# Patient Record
Sex: Male | Born: 1980
Health system: Southern US, Community
[De-identification: ages and names within clinical notes are randomized; demographics above are authoritative.]

## PROBLEM LIST (undated history)

## (undated) DIAGNOSIS — A6000 Herpesviral infection of urogenital system, unspecified: Secondary | ICD-10-CM

## (undated) DIAGNOSIS — M779 Enthesopathy, unspecified: Secondary | ICD-10-CM

## (undated) DIAGNOSIS — M7520 Bicipital tendinitis, unspecified shoulder: Secondary | ICD-10-CM

## (undated) DIAGNOSIS — M8700 Idiopathic aseptic necrosis of unspecified bone: Secondary | ICD-10-CM

## (undated) DIAGNOSIS — Z8639 Personal history of other endocrine, nutritional and metabolic disease: Secondary | ICD-10-CM

## (undated) DIAGNOSIS — K219 Gastro-esophageal reflux disease without esophagitis: Secondary | ICD-10-CM

## (undated) DIAGNOSIS — L4050 Arthropathic psoriasis, unspecified: Secondary | ICD-10-CM

## (undated) DIAGNOSIS — F909 Attention-deficit hyperactivity disorder, unspecified type: Secondary | ICD-10-CM

## (undated) DIAGNOSIS — L409 Psoriasis, unspecified: Secondary | ICD-10-CM

## (undated) DIAGNOSIS — J329 Chronic sinusitis, unspecified: Secondary | ICD-10-CM

## (undated) DIAGNOSIS — I10 Essential (primary) hypertension: Secondary | ICD-10-CM

## (undated) HISTORY — DX: Psoriasis, unspecified: L40.9

## (undated) HISTORY — DX: Herpesviral infection of urogenital system, unspecified: A60.00

## (undated) HISTORY — PX: KNEE ARTHROSCOPY: SUR90

## (undated) HISTORY — DX: Essential (primary) hypertension: I10

## (undated) HISTORY — DX: Bicipital tendinitis, unspecified shoulder: M75.20

## (undated) HISTORY — DX: Idiopathic aseptic necrosis of unspecified bone: M87.00

## (undated) HISTORY — DX: Attention-deficit hyperactivity disorder, unspecified type: F90.9

## (undated) HISTORY — PX: VASECTOMY: SHX75

## (undated) HISTORY — DX: Arthropathic psoriasis, unspecified: L40.50

## (undated) HISTORY — DX: Personal history of other endocrine, nutritional and metabolic disease: Z86.39

## (undated) HISTORY — DX: Gastro-esophageal reflux disease without esophagitis: K21.9

---

## 2011-02-14 ENCOUNTER — Emergency Department (INDEPENDENT_AMBULATORY_CARE_PROVIDER_SITE_OTHER)
Admission: EM | Admit: 2011-02-14 | Discharge: 2011-02-14 | Disposition: A | Discharge status: Home / Self Care | Payer: BC Managed Care – PPO | Source: Home / Self Care | Attending: Family Medicine | Admitting: Family Medicine

## 2011-02-14 ENCOUNTER — Encounter (HOSPITAL_COMMUNITY): Payer: Self-pay | Admitting: *Deleted

## 2011-02-14 DIAGNOSIS — H6692 Otitis media, unspecified, left ear: Secondary | ICD-10-CM

## 2011-02-14 DIAGNOSIS — J019 Acute sinusitis, unspecified: Secondary | ICD-10-CM

## 2011-02-14 DIAGNOSIS — H6690 Otitis media, unspecified, unspecified ear: Secondary | ICD-10-CM

## 2011-02-14 HISTORY — DX: Enthesopathy, unspecified: M77.9

## 2011-02-14 HISTORY — DX: Chronic sinusitis, unspecified: J32.9

## 2011-02-14 MED ORDER — IPRATROPIUM BROMIDE 0.06 % NA SOLN: 2.0000 | Freq: Four times a day (QID) | NASAL | Status: DC

## 2011-02-14 MED ORDER — CEFDINIR 300 MG PO CAPS: 300.0000 mg | ORAL_CAPSULE | Freq: Two times a day (BID) | ORAL | Status: AC

## 2011-02-14 MED ORDER — PSEUDOEPHEDRINE-GUAIFENESIN ER 120-1200 MG PO TB12: 120.0000 mg | ORAL_TABLET | Freq: Two times a day (BID) | ORAL | Status: DC

## 2011-02-14 NOTE — ED Provider Notes (Cosign Needed)
History     CSN: [id number]  Arrival date & time 02/14/11  1604   First MD Initiated Contact with Patient 02/14/11 1645      Chief Complaint  Patient presents with  . Cough  . Nasal Congestion  . Headache  . Otalgia    (Consider location/radiation/quality/duration/timing/severity/associated sxs/prior treatment) Patient is a 31 y.o. male presenting with cough, headaches, and ear pain. The history is provided by the patient.  Cough This is a new problem. The current episode started yesterday (uri sx last week ,now with left ear discomfort and muffled hearing,). The problem has been gradually worsening. Associated symptoms include ear congestion, ear pain, headaches and rhinorrhea. He is not a smoker.  Headache The primary symptoms include headaches.  Otalgia Associated symptoms include headaches, rhinorrhea and cough.    Past Medical History  Diagnosis Date  . Sinusitis   . Tendonitis     History reviewed. No pertinent past surgical history.  History reviewed. No pertinent family history.  History  Substance Use Topics  . Smoking status: Never Smoker   . Smokeless tobacco: Not on file  . Alcohol Use: Yes      Review of Systems  Constitutional: Negative.   HENT: Positive for ear pain, congestion, rhinorrhea and postnasal drip.   Eyes: Negative.   Respiratory: Positive for cough.   Cardiovascular: Negative.   Gastrointestinal: Negative.   Musculoskeletal: Negative.   Neurological: Positive for headaches.    Allergies  Review of patient's allergies indicates no known allergies.  Home Medications   Current Outpatient Rx  Name Route Sig Dispense Refill  . CEFDINIR 300 MG PO CAPS Oral Take 1 capsule (300 mg total) by mouth 2 (two) times daily. 20 capsule 0  . IPRATROPIUM BROMIDE 0.06 % NA SOLN Nasal Place 2 sprays into the nose 4 (four) times daily. 15 mL 12  . PSEUDOEPHEDRINE-GUAIFENESIN ER [phone] MG PO TB12 Oral Take 120-1,200 mg by mouth 2 (two) times  daily. 30 each 0    BP 141/77  Pulse 85  Temp(Src) 97.7 F (36.5 C) (Oral)  Resp 18  SpO2 94%  Physical Exam  Nursing note and vitals reviewed. Constitutional: He appears well-developed and well-nourished.  HENT:  Head: Normocephalic.  Right Ear: External ear and ear canal normal. Tympanic membrane is retracted.  Left Ear: External ear and ear canal normal. Tympanic membrane is injected, erythematous and retracted. Tympanic membrane mobility is abnormal. A middle ear effusion is present. Decreased hearing is noted.    ED Course  Procedures (including critical care time)  Labs Reviewed - No data to display No results found.   1. Otitis media of left ear   2. Sinusitis acute       MDM          Barkley Bruns, MD 02/14/11 1734

## 2011-02-14 NOTE — ED Notes (Signed)
Pt with onset of cough/congestion x one week - now with headache / left ear pain - decreased hearing left ear - history sinus infections

## 2012-08-22 ENCOUNTER — Ambulatory Visit
Admission: RE | Admit: 2012-08-22 | Discharge: 2012-08-22 | Disposition: A | Discharge status: Home / Self Care | Payer: BC Managed Care – PPO | Source: Ambulatory Visit | Attending: Rheumatology | Admitting: Rheumatology

## 2012-08-22 ENCOUNTER — Other Ambulatory Visit: Payer: Self-pay | Admitting: Rheumatology

## 2012-08-22 DIAGNOSIS — Z008 Encounter for other general examination: Secondary | ICD-10-CM

## 2015-10-17 LAB — HEPATIC FUNCTION PANEL
ALT: 62 U/L — AB (ref 10–40)
AST: 31 U/L (ref 14–40)
Alkaline Phosphatase: 58 U/L (ref 25–125)
Bilirubin, Total: 0.4 mg/dL

## 2015-10-17 LAB — BASIC METABOLIC PANEL
BUN: 8 mg/dL (ref 4–21)
Creatinine: 1.1 mg/dL (ref 0.6–1.3)
Glucose: 89 mg/dL
Potassium: 4.6 mmol/L (ref 3.4–5.3)
Sodium: 137 mmol/L (ref 137–147)

## 2015-10-17 LAB — CBC AND DIFFERENTIAL
HCT: 45 % (ref 41–53)
Hemoglobin: 15.5 g/dL (ref 13.5–17.5)
Platelets: 344 10*3/uL (ref 150–399)
WBC: 6.1 10^3/mL

## 2015-11-13 ENCOUNTER — Ambulatory Visit: Payer: Self-pay | Admitting: Rheumatology

## 2015-12-03 ENCOUNTER — Encounter: Payer: Self-pay | Admitting: *Deleted

## 2015-12-03 DIAGNOSIS — L409 Psoriasis, unspecified: Secondary | ICD-10-CM

## 2015-12-03 DIAGNOSIS — M7520 Bicipital tendinitis, unspecified shoulder: Secondary | ICD-10-CM

## 2015-12-03 DIAGNOSIS — Z9119 Patient's noncompliance with other medical treatment and regimen: Secondary | ICD-10-CM | POA: Insufficient documentation

## 2015-12-03 DIAGNOSIS — F329 Major depressive disorder, single episode, unspecified: Secondary | ICD-10-CM | POA: Insufficient documentation

## 2015-12-03 DIAGNOSIS — F32A Depression, unspecified: Secondary | ICD-10-CM | POA: Insufficient documentation

## 2015-12-03 DIAGNOSIS — K219 Gastro-esophageal reflux disease without esophagitis: Secondary | ICD-10-CM

## 2015-12-03 DIAGNOSIS — I10 Essential (primary) hypertension: Secondary | ICD-10-CM

## 2015-12-03 DIAGNOSIS — L4050 Arthropathic psoriasis, unspecified: Secondary | ICD-10-CM

## 2015-12-03 DIAGNOSIS — Z91199 Patient's noncompliance with other medical treatment and regimen due to unspecified reason: Secondary | ICD-10-CM | POA: Insufficient documentation

## 2015-12-03 DIAGNOSIS — A6000 Herpesviral infection of urogenital system, unspecified: Secondary | ICD-10-CM | POA: Insufficient documentation

## 2015-12-03 DIAGNOSIS — F40298 Other specified phobia: Secondary | ICD-10-CM | POA: Insufficient documentation

## 2015-12-03 DIAGNOSIS — F909 Attention-deficit hyperactivity disorder, unspecified type: Secondary | ICD-10-CM

## 2015-12-03 HISTORY — DX: Bicipital tendinitis, unspecified shoulder: M75.20

## 2015-12-03 HISTORY — DX: Essential (primary) hypertension: I10

## 2015-12-03 HISTORY — DX: Herpesviral infection of urogenital system, unspecified: A60.00

## 2015-12-03 HISTORY — DX: Attention-deficit hyperactivity disorder, unspecified type: F90.9

## 2015-12-03 HISTORY — DX: Gastro-esophageal reflux disease without esophagitis: K21.9

## 2015-12-03 HISTORY — DX: Psoriasis, unspecified: L40.9

## 2015-12-03 HISTORY — DX: Arthropathic psoriasis, unspecified: L40.50

## 2015-12-03 NOTE — Progress Notes (Signed)
*  IMAGE* Office Visit Note  Patient: Jacob Massey             Date of Birth: 1981-01-30           MRN: [id number]             PCP: Londell MohPHARR,WALTER DAVIDSON, MD Referring: No ref. provider found Visit Date: 12/04/2015 Occupation:@GUAROCC @    Subjective:  Pain bilateral hands   History of Present Illness: Jacob HayShawn Newhouse is a 35 y.o. male with psoriatic arthritis. He is been having a lot of pain and discomfort in his hands and feet. His psoriasis has been flaring. He has been off Humira for a month now and wants to start on Enbrel as soon as possible. 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      No Rheumatology ROS completed.                                              Objective: Vital Signs: There were no vitals taken for this visit.   Physical Exam not performed  Musculoskeletal Exam:  CDAI Exam: No CDAI exam completed.    Investigation: Findings:  10/15/2015 CBC normal, CMP normal, TB: Negative    Imaging: Koreas Extrem Up Bilat Comp  Result Date: 12/04/2015 Ultrasound examination of bilateral hands was performed using your recommendation after informed consent was obtained. Using 12 MHz transducer grayscale and color Doppler bilateral second and third MCP joints bilateral second and third and fifth digit PIP and DIP joints and bilateral wrist joints both dorsal and volar aspects were evaluated. The findings were he had narrowing of all PIP/DIP joints. There was mild synovitis in bilateral second MCP joint and bilateral wrist joints. Right median nerve was 0.13 cm squares which was upper limits of normal and left was 0.12 cm squares which was upper limits of normal. Impression these findings are consistent with mild inflammatory arthritis he has underlying psoriatic arthritis. The median nerves are within normal limits.   Speciality Comments: No specialty comments available.    Procedures:   No procedures performed Allergies: Methotrexate derivatives   Assessment / Plan: Visit Diagnoses: Psoriatic arthritis (HCC) active disease with ongoing pain and discomfort.  Psoriasis: Patient is having a flare.  High risk medication use he's been off Humira. We had detailed discussion regarding dictation side effects contraindications of Enbrel. After informed consent was obtained he was given his first Enbrel injection subcutaneously in the office today. He received  detailed counseling by Dr. Orson AloeHenderson of her pharmacist. After injection and he was observed in the office for 30 minutes and no side effects are observed.  Depression, unspecified depression type  Noncompliance  Needle phobia    Orders: Orders Placed This Encounter  Procedures  . US Extrem Up Bilat Comp   No orders of the defined types were placed in this encounter.   Face-to-face time spent with patient was 30 minutes. 50% of time was spent in counseling and coordination of care.  Follow-Up Instructions: No Follow-up on file.

## 2015-12-04 ENCOUNTER — Encounter: Payer: Self-pay | Admitting: Rheumatology

## 2015-12-04 ENCOUNTER — Ambulatory Visit (INDEPENDENT_AMBULATORY_CARE_PROVIDER_SITE_OTHER): Payer: BLUE CROSS/BLUE SHIELD | Admitting: Rheumatology

## 2015-12-04 ENCOUNTER — Inpatient Hospital Stay (INDEPENDENT_AMBULATORY_CARE_PROVIDER_SITE_OTHER): Payer: BLUE CROSS/BLUE SHIELD

## 2015-12-04 VITALS — BP 132/96 | HR 66

## 2015-12-04 DIAGNOSIS — Z79899 Other long term (current) drug therapy: Secondary | ICD-10-CM | POA: Diagnosis not present

## 2015-12-04 DIAGNOSIS — L409 Psoriasis, unspecified: Secondary | ICD-10-CM | POA: Diagnosis not present

## 2015-12-04 DIAGNOSIS — M79642 Pain in left hand: Secondary | ICD-10-CM | POA: Diagnosis not present

## 2015-12-04 DIAGNOSIS — F329 Major depressive disorder, single episode, unspecified: Secondary | ICD-10-CM | POA: Diagnosis not present

## 2015-12-04 DIAGNOSIS — L4050 Arthropathic psoriasis, unspecified: Secondary | ICD-10-CM

## 2015-12-04 DIAGNOSIS — F40298 Other specified phobia: Secondary | ICD-10-CM

## 2015-12-04 DIAGNOSIS — Z9119 Patient's noncompliance with other medical treatment and regimen: Secondary | ICD-10-CM

## 2015-12-04 DIAGNOSIS — M79641 Pain in right hand: Secondary | ICD-10-CM | POA: Diagnosis not present

## 2015-12-04 DIAGNOSIS — F32A Depression, unspecified: Secondary | ICD-10-CM

## 2015-12-04 DIAGNOSIS — Z91199 Patient's noncompliance with other medical treatment and regimen due to unspecified reason: Secondary | ICD-10-CM

## 2015-12-04 MED ORDER — ETANERCEPT 50 MG/ML ~~LOC~~ SOAJ: 50.0000 mg | SUBCUTANEOUS | 0 refills | Status: DC

## 2015-12-04 NOTE — Patient Instructions (Signed)
Standing Labs We placed an order today for your standing lab work.    Please come back and get your standing labs in one month then every 2 months.   We have open lab Monday through Friday from 8:30-11:30 AM and 1-4 PM at the office of Dr. Arbutus PedShaili Deveshwar/Naitik Panwala, PA.   The office is located at [address], [address], [place], [place] [address] No appointment is necessary.   Labs are drawn by [facility].  You may receive a bill from [facility] for your lab work.     Etanercept injection What is this medicine? ETANERCEPT (et a MotorolaER sept) is used for the treatment of rheumatoid arthritis in adults and children. The medicine is also used to treat psoriatic arthritis, ankylosing spondylitis, and psoriasis. This medicine may be used for other purposes; ask your health care provider or pharmacist if you have questions. What should I tell my health care provider before I take this medicine? They need to know if you have any of these conditions: -blood disorders -cancer -congestive heart failure -diabetes -exposure to chickenpox -immune system problems -infection -multiple sclerosis -seizure disorder -tuberculosis, a positive skin test for tuberculosis or have recently been in close contact with someone who has tuberculosis -Wegener's granulomatosis -an unusual or allergic reaction to etanercept, latex, other medicines, foods, dyes, or preservatives -pregnant or trying to get pregnant -breast-feeding How should I use this medicine? The medicine is given by injection under the skin. You will be taught how to prepare and give this medicine. Use exactly as directed. Take your medicine at regular intervals. Do not take your medicine more often than directed. It is important that you put your used needles and syringes in a special sharps container. Do not put them in a trash can. If you do not have a sharps container, call your pharmacist or healthcare provider to get one. A special MedGuide  will be given to you by the pharmacist with each prescription and refill. Be sure to read this information carefully each time. Talk to your pediatrician regarding the use of this medicine in children. While this drug may be prescribed for children as young as 37 years of age for selected conditions, precautions do apply. Overdosage: If you think you have taken too much of this medicine contact a poison control center or emergency room at once. NOTE: This medicine is only for you. Do not share this medicine with others. What if I miss a dose? If you miss a dose, contact your health care professional to find out when you should take your next dose. Do not take double or extra doses without advice. What may interact with this medicine? Do not take this medicine with any of the following medications: -anakinra This medicine may also interact with the following medications: -cyclophosphamide -sulfasalazine -vaccines This list may not describe all possible interactions. Give your health care provider a list of all the medicines, herbs, non-prescription drugs, or dietary supplements you use. Also tell them if you smoke, drink alcohol, or use illegal drugs. Some items may interact with your medicine. What should I watch for while using this medicine? Tell your doctor or healthcare professional if your symptoms do not start to get better or if they get worse. You will be tested for tuberculosis (TB) before you start this medicine. If your doctor prescribes any medicine for TB, you should start taking the TB medicine before starting this medicine. Make sure to finish the full course of TB medicine. Call your doctor  or health care professional for advice if you get a fever, chills or sore throat, or other symptoms of a cold or flu. Do not treat yourself. This drug decreases your body's ability to fight infections. Try to avoid being around people who are sick. What side effects may I notice from receiving  this medicine? Side effects that you should report to your doctor or health care professional as soon as possible: -allergic reactions like skin rash, itching or hives, swelling of the face, lips, or tongue -changes in vision -fever, chills or any other sign of infection -numbness or tingling in legs or other parts of the body -red, scaly patches or raised bumps on the skin -shortness of breath or difficulty breathing -swollen lymph nodes in the neck, underarm, or groin areas -unexplained weight loss -unusual bleeding or bruising -unusual swelling or fluid retention in the legs -unusually weak or tired Side effects that usually do not require medical attention (report to your doctor or health care professional if they continue or are bothersome): -dizziness -headache -nausea -redness, itching, or swelling at the injection site -vomiting This list may not describe all possible side effects. Call your doctor for medical advice about side effects. You may report side effects to FDA at 1-800-FDA-1088. Where should I keep my medicine? Keep out of the reach of children. Store between 2 and 8 degrees C (36 and 46 degrees F). Do not freeze or shake. Protect from light. Throw away any unused medicine after the expiration date. You will be instructed on how to store this medicine. NOTE: This sheet is a summary. It may not cover all possible information. If you have questions about this medicine, talk to your doctor, pharmacist, or health care provider.    2016, Elsevier/Gold Standard. (2011-07-27 15:33:36)

## 2015-12-04 NOTE — Progress Notes (Signed)
Pharmacy Note  Subjective: Patient presents today to the [facility] to see Dr. Corliss Skainseveshwar.  I previously Counseled patient on Mauritaniatezla; however, insurance states patient must first try and fail Enbrel before Harith Mcc[place] will be approved.  Per Dr. Corliss Skainseveshwar, okay to start patient on Enbrel at this time.  Patient seen by the pharmacist for counseling on Enbrel.    Objective: TB Test: negative (10/15/15) Hepatitis panel: negative (08/22/12)  CBC    Component Value Date/Time   WBC 6.1 10/17/2015   HGB 15.5 10/17/2015   HCT 45 10/17/2015   PLT 344 10/17/2015    Assessment/Plan:  Counseled patient that Enbrel is a TNF blocking agent.  Reviewed Enbrel dose of 50 mg once weekly.  Counseled patient on purpose, proper use, and adverse effects of Enbrel.  Reviewed the most common adverse effects including infections, headache, and injection site reactions. Discussed that there is the possibility of an increased risk of malignancy but it is not well understood if this increased risk is due to the medication or the disease state.  Advised patient to get yearly dermatology exams due to risk of skin cancer.  Reviewed the importance of regular labs while on Enbrel therapy.  Advised patient to get standing labs one month after starting Enbrel then every 3 months.  Provided patient with standing lab orders.  Counseled patient that Enbrel should be held prior to scheduled surgery.  Counseled patient to avoid live vaccines while on Enbrel.  Advised patient to get annual influenza vaccine and the pneumococcal vaccine as needed.  Provided patient with medication education material and answered all questions.  Patient voiced understanding.  Patient consented to Enbrel.  Will upload consent into the media tab.  Educated patient how to use Enbrel SureClick pen.  Patient administered first Enbrel injection in clinic today (Lot #[id number], Exp 12/19).  Patient was monitored for 30 minutes after injection and tolerated  medication well.  No injection site redness was noted.  Lilla Shookachel Bryston Colocho, Pharm.D., BCPS Clinical Pharmacist Pager: [phone] Phone: [phone] 12/04/2015 2:29 PM

## 2015-12-13 ENCOUNTER — Other Ambulatory Visit: Payer: Self-pay | Admitting: Radiology

## 2015-12-13 ENCOUNTER — Telehealth: Payer: Self-pay | Admitting: Rheumatology

## 2015-12-13 MED ORDER — ETANERCEPT 50 MG/ML ~~LOC~~ SOAJ: 50.0000 mg | SUBCUTANEOUS | 2 refills | Status: DC

## 2015-12-13 MED FILL — ENBREL 50 MG/ML SURECLICK S: 50 | 28 days supply | Qty: 4 | Fill #0

## 2015-12-13 NOTE — Telephone Encounter (Signed)
Fleet ContrasRachel and [place] have left message. I have discussed with patient. He can get his med[facility] at  [facility] for $5 copay. I have resent the Rx for him, he wants mailed to him, I have verified his address and sent the Rx / told him the pharmacy will call him   He states he has a sinus infection and has held the enbrel until this resolves. He will let  Me know if he has any problems or needs anything

## 2015-12-13 NOTE — Telephone Encounter (Signed)
Patient states he received a voicemail from here yesterday about Enbrel rx. Please call patient back.

## 2016-01-06 ENCOUNTER — Telehealth: Payer: Self-pay

## 2016-01-14 ENCOUNTER — Ambulatory Visit: Payer: Self-pay | Admitting: Rheumatology

## 2016-02-06 NOTE — Telephone Encounter (Signed)
Called to verifify that patient needs a refill for Enbrel. L/m for patient to call back.

## 2016-02-21 ENCOUNTER — Telehealth: Payer: Self-pay | Admitting: Rheumatology

## 2016-02-21 NOTE — Telephone Encounter (Signed)
Patient is requesting refill of enbrel. He isn't sure where he gets this filled at.

## 2016-02-24 NOTE — Telephone Encounter (Signed)
He was here today, [name] took care of him.

## 2016-02-28 MED FILL — ENBREL 50 MG/ML SURECLICK S: 50 | 28 days supply | Qty: 4 | Fill #1

## 2016-03-12 DIAGNOSIS — Z8719 Personal history of other diseases of the digestive system: Secondary | ICD-10-CM | POA: Insufficient documentation

## 2016-03-12 DIAGNOSIS — M47816 Spondylosis without myelopathy or radiculopathy, lumbar region: Secondary | ICD-10-CM | POA: Insufficient documentation

## 2016-03-12 DIAGNOSIS — Z8679 Personal history of other diseases of the circulatory system: Secondary | ICD-10-CM | POA: Insufficient documentation

## 2016-03-12 NOTE — Progress Notes (Signed)
Office Visit Note  Patient: Jacob Massey             Date of Birth: [birth year]           MRN: [id number]             PCP: Horatio Pel, MD Referring: Deland Pretty, MD Visit Date: 03/16/2016 Occupation: _0 @    Subjective:  Pain of the Right Knee and Follow-up Follow-up on psoriatic arthritis and psoriasis  History of Present Illness: Jacob Massey is a 36 y.o. male  Last seen 10/15/2015 Patient is now on Enbrel. He takes it every week.  Had a sinus infection and had to discontinue Enbrel for short period of time. Currently he feels that the Enbrel is not as painful as the Humira injection and is happy to continue with Enbrel. He does complain that while he was off of the Enbrel, he had warmth at night to his MCP joints bilaterally which causes him to wake up. Otherwise he is tolerating the medication well.  He has fair amount of psoriasis to bilateral elbows and bilateral knees.    Activities of Daily Living:  Patient reports morning stiffness for 15 minutes.   Patient Denies nocturnal pain.  Difficulty dressing/grooming: Denies Difficulty climbing stairs: Denies Difficulty getting out of chair: Denies Difficulty using hands for taps, buttons, cutlery, and/or writing: Denies   Review of Systems  Constitutional: Negative for fatigue.  HENT: Negative for mouth sores and mouth dryness.   Eyes: Negative for dryness.  Respiratory: Negative for shortness of breath.   Gastrointestinal: Negative for constipation and diarrhea.  Musculoskeletal: Negative for myalgias and myalgias.  Skin: Negative for sensitivity to sunlight.  Neurological: Negative for memory loss.  Psychiatric/Behavioral: Negative for sleep disturbance.    PMFS History:  Patient Active Problem List   Diagnosis Date Noted  . History of gastroesophageal reflux (GERD) 03/12/2016  . History of hypertension 03/12/2016  . Spondylosis of lumbar region without myelopathy or radiculopathy  03/12/2016  . Psoriasis 12/03/2015  . Psoriatic arthritis ([place]) 12/03/2015  . Bicipital tendinitis 12/03/2015  . Hypertension 12/03/2015  . GERD (gastroesophageal reflux disease) 12/03/2015  . ADHD 12/03/2015  . Genital herpes 12/03/2015  . Depression 12/03/2015  . Noncompliance 12/03/2015  . Needle phobia 12/03/2015    Past Medical History:  Diagnosis Date  . ADHD 12/03/2015  . Bicipital tendinitis 12/03/2015  . Genital herpes 12/03/2015  . GERD (gastroesophageal reflux disease) 12/03/2015  . Hypertension 12/03/2015  . Psoriasis 12/03/2015  . Psoriatic arthritis ([place]) 12/03/2015  . Sinusitis   . Tendonitis     No family history on file. No past surgical history on file. Social History   Social History Narrative  . No narrative on file     Objective: Vital Signs: BP 138/70   Pulse 82   Resp 16   Ht _1  (1.854 m)   Wt 244 lb (110.7 kg)   BMI 32.19 kg/m    Physical Exam  Constitutional: He is oriented to person, place, and time. He appears well-developed and well-nourished.  HENT:  Head: Normocephalic and atraumatic.  Eyes: Conjunctivae and EOM are normal. Pupils are equal, round, and reactive to light.  Neck: Normal range of motion. Neck supple.  Cardiovascular: Normal rate, regular rhythm and normal heart sounds.  Exam reveals no gallop and no friction rub.   No murmur heard. Pulmonary/Chest: Effort normal and breath sounds normal. No respiratory distress. He has no wheezes. He has no rales. He exhibits no tenderness.  Abdominal: Soft. He exhibits no distension and no mass. There is no tenderness. There is no guarding.  Musculoskeletal: Normal range of motion.  Lymphadenopathy:    He has no cervical adenopathy.  Neurological: He is alert and oriented to person, place, and time. He exhibits normal muscle tone. Coordination normal.  Skin: Skin is warm and dry. Capillary refill takes less than 2 seconds. No rash noted.  Psychiatric: He has a normal mood and  affect. His behavior is normal. Judgment and thought content normal.  Nursing note and vitals reviewed.    Musculoskeletal Exam:  Full range of motion of all joints Grip strength is equal and strong bilaterally Fibromyalgia tender points are all absent  CDAI Exam: No CDAI exam completed.  No synovitis on examination  Investigation: Findings:   Labs from 10/15/15 shows CMP with GFR normal, CBC with diff is normal. TB gold is negative   08/15/12 We obtained x-ray of bilateral hands today in the office, which showed PIP narrowing, minimal without erosive changes.  Ferguson view, pelvis, was also within normal limits without any sclerosis or joint space narrowing.     Labs from July 2014 show aldolase at 11.2.  CK is elevated at 329.  During this time the patient was doing a significant amount of heavy lifting and he dead lifts about 255 pounds.  As a result of this history it is possible that this caused elevated CK levels.  We will monitor this, but at this time we do not feel that there is any kind of myositis going on.  Urinalysis is negative.  HLA-B27 is negative.  TB Gold is negative.  CBC is normal and CMP is normal.  Sed rate is 5.  Uric acid is 5.7.  TSH is normal.  Rheumatoid factor is negative.  CCP is negative, ANA negative and hepatitis panel is negative.    12/25/2013 Bilateral knee joint x-rays done today.  Mild medial compartment narrowing bilaterally and otherwise within normal limits.      Imaging: No results found.      Bilateral elbows and knees have a fair amount of psoriasis. Patient is declining clobetasol cream at this time. He states "my work keeps me very busy and is not convenient for me to use topicals"  Speciality Comments: No specialty comments available.    Procedures:  No procedures performed Allergies: Methotrexate derivatives   Assessment / Plan:     Visit Diagnoses: Psoriatic arthritis ([place])  Psoriasis  High risk medication use -  03/16/2016: Enbrel every week[Failed Humira; can't take Remicade because he is unable to take methotrexate; needle phobia; history noncompliance] - Plan: COMPLETE METABOLIC PANEL WITH GFR, CBC with Differential/Platelet, CBC with Differential/Platelet, COMPLETE METABOLIC PANEL WITH GFR, CBC with Differential/Platelet, COMPLETE METABOLIC PANEL WITH GFR, Quantiferon tb gold assay (blood)  History of gastroesophageal reflux (GERD)  History of hypertension  Needle phobia  Spondylosis of lumbar region without myelopathy or radiculopathy  Noncompliance   Plan: #1: Psoriatic arthritis and psoriasis Currently on Enbrel every week. Had to suspend Enbrel for couple of weeks due to sinus infection. That is now treated and he is doing well. He will restart his Enbrel and we will see him back in 3 months to see how well he is doing with the medication.  Note that he had inadequate response to Humira.  Note that he also has a history of noncompliance, needle phobia. As a result we want to do O tesla but it was not approved. We are unable  to use Remicade since patient cannot take methotrexate  Patient has complained of warmth to bilateral MCP joints at night at times. When patient has enough Enbrel on board, we will recheck and see how well he is tolerating that discomfort of warmth to his knuckles at night. He said since periodic, and there is no pain associated with it. There was no synovitis on examination of his wrist or his MCPs today. On 10/15/2015, he had a fair amount of synovitis on bilateral wrist joints and synovial thickening of bilateral MCP joints. None of that is occurring at this examination  Patient's TB goal is negative as of September 2017. He'll be due for repeat TB gold September 2018  Orders: Orders Placed This Encounter  Procedures  . COMPLETE METABOLIC PANEL WITH GFR  . CBC with Differential/Platelet  . CBC with Differential/Platelet  . COMPLETE METABOLIC PANEL WITH  GFR  . Quantiferon tb gold assay (blood)   No orders of the defined types were placed in this encounter.   Face-to-face time spent with patient was 30 minutes. 50% of time was spent in counseling and coordination of care.  Follow-Up Instructions: Return in about 3 months (around 06/13/2016).   Eliezer Lofts, PA-C  Note - This record has been created using [employer].  Chart creation errors have been sought, but may not always  have been located. Such creation errors do not reflect on  the standard of medical care.

## 2016-03-16 ENCOUNTER — Encounter: Payer: Self-pay | Admitting: Rheumatology

## 2016-03-16 ENCOUNTER — Ambulatory Visit (INDEPENDENT_AMBULATORY_CARE_PROVIDER_SITE_OTHER): Payer: BLUE CROSS/BLUE SHIELD | Admitting: Rheumatology

## 2016-03-16 ENCOUNTER — Ambulatory Visit: Payer: Self-pay | Admitting: Rheumatology

## 2016-03-16 VITALS — BP 138/70 | HR 82 | Resp 16 | Ht 73.0 in | Wt 244.0 lb

## 2016-03-16 DIAGNOSIS — Z91199 Patient's noncompliance with other medical treatment and regimen due to unspecified reason: Secondary | ICD-10-CM

## 2016-03-16 DIAGNOSIS — Z79899 Other long term (current) drug therapy: Secondary | ICD-10-CM

## 2016-03-16 DIAGNOSIS — F40298 Other specified phobia: Secondary | ICD-10-CM

## 2016-03-16 DIAGNOSIS — L4050 Arthropathic psoriasis, unspecified: Secondary | ICD-10-CM

## 2016-03-16 DIAGNOSIS — Z8719 Personal history of other diseases of the digestive system: Secondary | ICD-10-CM

## 2016-03-16 DIAGNOSIS — L409 Psoriasis, unspecified: Secondary | ICD-10-CM

## 2016-03-16 DIAGNOSIS — Z8679 Personal history of other diseases of the circulatory system: Secondary | ICD-10-CM

## 2016-03-16 DIAGNOSIS — M47816 Spondylosis without myelopathy or radiculopathy, lumbar region: Secondary | ICD-10-CM

## 2016-03-16 DIAGNOSIS — Z9119 Patient's noncompliance with other medical treatment and regimen: Secondary | ICD-10-CM

## 2016-03-16 LAB — COMPLETE METABOLIC PANEL WITH GFR
ALT: 67 U/L — ABNORMAL HIGH (ref 9–46)
AST: 32 U/L (ref 10–40)
Albumin: 4.3 g/dL (ref 3.6–5.1)
Alkaline Phosphatase: 57 U/L (ref 40–115)
BUN: 10 mg/dL (ref 7–25)
CO2: 24 mmol/L (ref 20–31)
Calcium: 9.3 mg/dL (ref 8.6–10.3)
Chloride: 105 mmol/L (ref 98–110)
Creat: 1.21 mg/dL (ref 0.60–1.35)
GFR, Est African American: 88 mL/min (ref >=60)
GFR, Est Non African American: 76 mL/min (ref >=60)
Glucose, Bld: 106 mg/dL — ABNORMAL HIGH (ref 65–99)
Potassium: 4.6 mmol/L (ref 3.5–5.3)
Sodium: 137 mmol/L (ref 135–146)
Total Bilirubin: 0.3 mg/dL (ref 0.2–1.2)
Total Protein: 7.2 g/dL (ref 6.1–8.1)

## 2016-03-16 NOTE — Progress Notes (Signed)
Rheumatology Medication Review by a Pharmacist Does the patient feel that his/her medications are working for him/her?  Yes he reports some improvement; however, patient has only been on the medication for a few weeks Has the patient been experiencing any side effects to the medications prescribed?  No Does the patient have any problems obtaining medications?  Yes - Patient could not remember the name of the pharmacy where he got his prescription from initially.  I gave the patient a card with the name and phone number of the pharmacy.    Issues to address at subsequent visits: Adherence   Pharmacist comments:  [name] presents for follow up of psoriatic arthritis and psoriasis.  He is currently taking Enbrel 50 mg weekly.  He started Enbrel on 12/04/2015.  He reports he stopped the medication for a period of time after starting it as he had a sinus infection.  He reports he just restarted the medication approximately two weeks ago.  Most recent standing labs were on 10/15/15 at which time CBC was normal and CMP was normal except for ALT 62.  Patient was due for standing labs one month after starting Enbrel.  Most recent TB Gold was on 10/15/15 which was negative.  Patient will be due for TB Gold again in September 2018.  Patient denies any questions regarding his medication at this time.    Jacob Massey, Pharm.D., BCPS, CPP Clinical Pharmacist Pager: [phone] Phone: [phone] 03/16/2016 9:12 AM

## 2016-03-17 LAB — CBC WITH DIFFERENTIAL/PLATELET
Basophils Absolute: 0 cells/uL (ref 0–200)
Basophils Relative: 0 %
Eosinophils Absolute: 378 cells/uL (ref 15–500)
Eosinophils Relative: 7 %
HCT: 45.8 % (ref 38.5–50.0)
Hemoglobin: 15.2 g/dL (ref 13.2–17.1)
Lymphocytes Relative: 42 %
Lymphs Abs: 2268 cells/uL (ref 850–3900)
MCH: 30 pg (ref 27.0–33.0)
MCHC: 33.2 g/dL (ref 32.0–36.0)
MCV: 90.3 fL (ref 80.0–100.0)
MPV: 9 fL (ref 7.5–12.5)
Monocytes Absolute: 540 cells/uL (ref 200–950)
Monocytes Relative: 10 %
Neutro Abs: 2214 cells/uL (ref 1500–7800)
Neutrophils Relative %: 41 %
Platelets: 304 10*3/uL (ref 140–400)
RBC: 5.07 MIL/uL (ref 4.20–5.80)
RDW: 14.5 % (ref 11.0–15.0)
WBC: 5.4 10*3/uL (ref 3.8–10.8)

## 2016-03-23 ENCOUNTER — Telehealth: Payer: Self-pay

## 2016-03-23 MED FILL — ENBREL 50 MG/ML SURECLICK S: 50 | 28 days supply | Qty: 4 | Fill #2

## 2016-03-23 NOTE — Telephone Encounter (Signed)
Noted patient's last refill of Enbrel was on 02/28/16  at [facility].I called patient with a refill reminder call.  Patient confirms he does need a refill at this time.  Will have the outpatient pharmacy process patient's prescription at this time.    He also states that he was concerned that he did not receive his full dose of his last injection on 03/19/16. He says that after his injection the pen was leaking fluid. "the white part of the pen did not go all the way down." I told him I would have the pharmacists contact him to review how to use the pen and verify that the pen was not defective. Fleet ContrasRachel will you call him? Thanks

## 2016-03-24 NOTE — Telephone Encounter (Signed)
I called patient regarding his concern.  I reviewed injection instructions with patient including the need to remove the white cap, place pen firm against the skin at a 90 degree angle, press the purple start button, and continue to hold firm against the skin while the medication is injecting.  Listen for the second click and continue to hold firm for a few seconds after the second click.    Patient confirms he followed the directions I reviewed.  He reports he knows the medication was injected because he felt the needle, but he noticed some fluid on his leg after the injection was completed.  I advised him to ensure he continues to hold the pen firm against the skin throughout the injection to ensure the needle does not move.  I advised patient to call me if he continues to have problems with the Enbrel SureClick pen.  Reviewed that there are other formulations of Enbrel (Syringe and Enbrel AutoTouch) that can be considered if he continues to have difficulty with the Enbrel SureClick pen.  Patient voiced understanding and denied any further questions at this time.    Lilla Shookachel Leonidas Boateng, Pharm.D., BCPS, CPP Clinical Pharmacist Pager: [phone] Phone: [phone] 03/24/2016 11:52 AM

## 2016-05-13 ENCOUNTER — Telehealth: Payer: Self-pay

## 2016-05-13 MED ORDER — ETANERCEPT 50 MG/ML ~~LOC~~ SOAJ: 50.0000 mg | SUBCUTANEOUS | 2 refills | Status: DC

## 2016-05-13 MED FILL — ENBREL 50 MG/ML SURECLICK S: 50 | 28 days supply | Qty: 4 | Fill #0

## 2016-05-13 NOTE — Telephone Encounter (Signed)
Noted patient's last refill of Enbrel was on 03/23/2016 at [facility]. I called patient with a refill reminder call.  Patient confirms he does need a refill at this time. Patient states he will inject his last pen on Monday, April 16th. He is out of refills at [facility].  He had no concerns about taking his medication. He is not sure how effective it is because his hands are sore. I explained that he would need to discuss that at his next appointment where it can determined if it is or not. I offered to let him speak to the pharmd and he declined.   Sue Lush, can we send a new Rx to [facility] for him? Thanks!  Kerline Trahan, [place], CPhT 9:31 AM

## 2016-05-13 NOTE — Telephone Encounter (Signed)
Last Visit: 03/16/16 Next Visit: 06/22/16 Labs: 03/16/16 WNL TB Gold: 10/2015 Neg  Okay to refill Enbrel?

## 2016-05-13 NOTE — Telephone Encounter (Signed)
I think I signed order but if I did not, ok to refill enbrel (90 day supply)

## 2016-06-08 ENCOUNTER — Telehealth: Payer: Self-pay

## 2016-06-08 NOTE — Telephone Encounter (Signed)
Noted patient's last refill of Enbrel was on 05/13/16  at [facility]  I called patient with a refill reminder call. Left message for patient to call back.  Keyanna Sandefer, [place], CPhT 9:29 AM

## 2016-06-09 ENCOUNTER — Telehealth: Payer: Self-pay

## 2016-06-09 MED ORDER — ETANERCEPT 50 MG/ML ~~LOC~~ SOAJ: 50.0000 mg | SUBCUTANEOUS | 2 refills | Status: DC

## 2016-06-09 MED FILL — ENBREL 50 MG/ML SURECLICK S: 50 | 28 days supply | Qty: 4 | Fill #1

## 2016-06-09 NOTE — Telephone Encounter (Signed)
Last Visit: 03/16/16 Next Visit: 07/06/16 Labs: 03/16/16 WNL TB Gold: 10/2015 Negative  Okay to refill Enbrel?

## 2016-06-09 NOTE — Telephone Encounter (Signed)
Noted patient's last refill of Enbrel was on 05/13/16 at [facility].  I called patient with a refill reminder call.  Patient confirms he does need a refill at this time.  Will have the outpatient pharmacy process patient's prescription at this time.    Patient also states that he has noticed a increase in joint pain and psoriasis spots on his hands. His next appointment is on June 4th. He plans to discuss his options with Dr. Corliss Skainseveshwar.   Alesia Oshields, [place], CPhT 4:23 PM

## 2016-06-09 NOTE — Telephone Encounter (Signed)
I signed the Enbrel that you had ready please verify that it went through

## 2016-06-22 ENCOUNTER — Ambulatory Visit: Payer: BLUE CROSS/BLUE SHIELD | Admitting: Rheumatology

## 2016-06-22 DIAGNOSIS — Z79899 Other long term (current) drug therapy: Secondary | ICD-10-CM | POA: Insufficient documentation

## 2016-06-22 NOTE — Progress Notes (Signed)
Office Visit Note  Patient: Jacob Massey             Date of Birth: [birth year]           MRN: [id number]             PCP: Deland Pretty, MD Referring: Deland Pretty, MD Visit Date: 07/06/2016 Occupation: '@[email or web address]' @    Subjective:  Medication Management   History of Present Illness: Jacob Massey is a 36 y.o. male  Last seen February 2018. At that time, patient had recently started Enbrel but had to suspend use of Enbrel for just a couple of weeks because he was trying to treat a sinus infection. Since then, he has restarted the Enbrel has been taking it every week as prescribed. Unfortunately, patient does not feel that it is working well for him. He has ongoing psoriasis to the bilateral knees and bilateral elbows as well as some other places. He continues to have joint pain swelling and stiffness. He does have warmth to his knuckles at night, some of his joints are more painful than others. He also feels that there is some MCP angulation starting. He is worried that the Enbrel was not effective for him. We have given the medication adequate time and he feels that we may benefit from switching him to another medication.  Patient's TB gold is up-to-date and negative as of February 2018.  He does need to update his CBC with differential and CMP with GFR.   Patient rates his discomfort to his joints as 7 on a scale of 0-10 most of the time during the month. He states that about 3 weeks out of the 4 weeks, he could be at a 7. The lowest that the patient's pain is described as a 3 on a scale of 0-10.  Activities of Daily Living:  Patient reports morning stiffness for 10-15 minutes.   Patient Denies nocturnal pain.  Difficulty dressing/grooming: Reports Difficulty climbing stairs: Denies Difficulty getting out of chair: Reports Difficulty using hands for taps, buttons, cutlery, and/or writing: Reports   Review of Systems  Constitutional: Negative for fatigue.  HENT:  Negative for mouth sores and mouth dryness.   Eyes: Negative for dryness.  Respiratory: Negative for shortness of breath.   Gastrointestinal: Negative for constipation and diarrhea.  Musculoskeletal: Negative for myalgias and myalgias.  Skin: Negative for sensitivity to sunlight.  Neurological: Negative for memory loss.  Psychiatric/Behavioral: Negative for sleep disturbance.    PMFS History:  Patient Active Problem List   Diagnosis Date Noted  . High risk medication use 06/22/2016  . History of gastroesophageal reflux (GERD) 03/12/2016  . History of hypertension 03/12/2016  . 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 Psoriasis 12/03/2015  . Psoriatic arthritis ([place]) 12/03/2015  . Bicipital tendinitis 12/03/2015  . Hypertension 12/03/2015  . GERD (gastroesophageal reflux disease) 12/03/2015  . ADHD 12/03/2015  . Genital herpes 12/03/2015  . Depression 12/03/2015  . Noncompliance 12/03/2015  . Needle phobia 12/03/2015    Past Medical History:  Diagnosis Date  . ADHD 12/03/2015  . Bicipital tendinitis 12/03/2015  . Genital herpes 12/03/2015  . GERD (gastroesophageal reflux disease) 12/03/2015  . Hypertension 12/03/2015  . Psoriasis 12/03/2015  . Psoriatic arthritis ([place]) 12/03/2015  . Sinusitis   . Tendonitis     History reviewed. No pertinent family history. Past Surgical History:  Procedure Laterality Date  . VASECTOMY     Social History   Social History Narrative  . No  narrative on file     Objective: Vital Signs: BP 134/76   Pulse 76   Resp 16   Ht '6\' 1"'  (1.854 m)   Wt 244 lb (110.7 kg)   BMI 32.19 kg/m    Physical Exam  Constitutional: He is oriented to person, place, and time. He appears well-developed and well-nourished.  HENT:  Head: Normocephalic and atraumatic.  Eyes: Conjunctivae and EOM are normal. Pupils are equal, round, and reactive to light.  Neck: Normal range of motion. Neck supple.    Cardiovascular: Normal rate, regular rhythm and normal heart sounds.  Exam reveals no gallop and no friction rub.   No murmur heard. Pulmonary/Chest: Effort normal and breath sounds normal. No respiratory distress. He has no wheezes. He has no rales. He exhibits no tenderness.  Abdominal: Soft. He exhibits no distension and no mass. There is no tenderness. There is no guarding.  Musculoskeletal: Normal range of motion.  Lymphadenopathy:    He has no cervical adenopathy.  Neurological: He is alert and oriented to person, place, and time. He exhibits normal muscle tone. Coordination normal.  Skin: Skin is warm and dry. Capillary refill takes less than 2 seconds. No rash noted.  Psychiatric: He has a normal mood and affect. His behavior is normal. Judgment and thought content normal.  Vitals reviewed.    Musculoskeletal Exam:  Full range of motion of all joints Grip strength is equal and strong bilaterally Fiber myalgia tender points are all absent  CDAI Exam: CDAI Homunculus Exam:   Tenderness:  Right hand: 3rd MCP  Swelling:  Right hand: 3rd MCP  Joint Counts:  CDAI Tender Joint count: 1 CDAI Swollen Joint count: 1     Investigation: Findings:  03/16/2016 negative TB gold   CBC Latest Ref Rng & Units 03/16/2016 10/17/2015  WBC 3.8 - 10.8 K/uL 5.4 6.1  Hemoglobin 13.2 - 17.1 g/dL 15.2 15.5  Hematocrit 38.5 - 50.0 % 45.8 45  Platelets 140 - 400 K/uL 304 344   CMP Latest Ref Rng & Units 03/16/2016 10/17/2015  Glucose 65 - 99 mg/dL 106(H) -  BUN 7 - 25 mg/dL 10 8  Creatinine 0.60 - 1.35 mg/dL 1.21 1.1  Sodium 135 - 146 mmol/L 137 137  Potassium 3.5 - 5.3 mmol/L 4.6 4.6  Chloride 98 - 110 mmol/L 105 -  CO2 20 - 31 mmol/L 24 -  Calcium 8.6 - 10.3 mg/dL 9.3 -  Total Protein 6.1 - 8.1 g/dL 7.2 -  Total Bilirubin 0.2 - 1.2 mg/dL 0.3 -  Alkaline Phos 40 - 115 U/L 57 58  AST 10 - 40 U/L 32 31  ALT 9 - 46 U/L 67(H) 62(A)   Office Visit on 03/16/2016  Component Date Value  Ref Range Status  . Sodium 03/16/2016 137  135 - 146 mmol/L Final  . Potassium 03/16/2016 4.6  3.5 - 5.3 mmol/L Final  . Chloride 03/16/2016 105  98 - 110 mmol/L Final  . CO2 03/16/2016 24  20 - 31 mmol/L Final  . Glucose, Bld 03/16/2016 106* 65 - 99 mg/dL Final  . BUN 03/16/2016 10  7 - 25 mg/dL Final  . Creat 03/16/2016 1.21  0.60 - 1.35 mg/dL Final  . Total Bilirubin 03/16/2016 0.3  0.2 - 1.2 mg/dL Final  . Alkaline Phosphatase 03/16/2016 57  40 - 115 U/L Final  . AST 03/16/2016 32  10 - 40 U/L Final  . ALT 03/16/2016 67* 9 - 46 U/L Final  . Total Protein  03/16/2016 7.2  6.1 - 8.1 g/dL Final  . Albumin 03/16/2016 4.3  3.6 - 5.1 g/dL Final  . Calcium 03/16/2016 9.3  8.6 - 10.3 mg/dL Final  . GFR, Est African American 03/16/2016 88  >=60 mL/min Final  . GFR, Est Non African American 03/16/2016 76  >=60 mL/min Final  . WBC 03/16/2016 5.4  3.8 - 10.8 K/uL Final  . RBC 03/16/2016 5.07  4.20 - 5.80 MIL/uL Final  . Hemoglobin 03/16/2016 15.2  13.2 - 17.1 g/dL Final  . HCT 03/16/2016 45.8  38.5 - 50.0 % Final  . MCV 03/16/2016 90.3  80.0 - 100.0 fL Final  . MCH 03/16/2016 30.0  27.0 - 33.0 pg Final  . MCHC 03/16/2016 33.2  32.0 - 36.0 g/dL Final  . RDW 03/16/2016 14.5  11.0 - 15.0 % Final  . Platelets 03/16/2016 304  140 - 400 K/uL Final  . MPV 03/16/2016 9.0  7.5 - 12.5 fL Final  . Neutro Abs 03/16/2016 2214  1,500 - 7,800 cells/uL Final  . Lymphs Abs 03/16/2016 2268  850 - 3,900 cells/uL Final  . Monocytes Absolute 03/16/2016 540  200 - 950 cells/uL Final  . Eosinophils Absolute 03/16/2016 378  15 - 500 cells/uL Final  . Basophils Absolute 03/16/2016 0  0 - 200 cells/uL Final  . Neutrophils Relative % 03/16/2016 41  % Final  . Lymphocytes Relative 03/16/2016 42  % Final  . Monocytes Relative 03/16/2016 10  % Final  . Eosinophils Relative 03/16/2016 7  % Final  . Basophils Relative 03/16/2016 0  % Final  . Smear Review 03/16/2016 Criteria for review not met   Final      Imaging: No results found.  Speciality Comments: No specialty comments available.    Procedures:  No procedures performed Allergies: Methotrexate derivatives   Assessment / Plan:     Visit Diagnoses: Psoriatic arthritis ([place]) - Plan: HIV antibody, IgG, IgA, IgM, CBC with Differential/Platelet, COMPLETE METABOLIC PANEL WITH GFR, Protein electrophoresis, serum  Psoriasis - Plan: HIV antibody, IgG, IgA, IgM, CBC with Differential/Platelet, COMPLETE METABOLIC PANEL WITH GFR, Protein electrophoresis, serum  High risk medication use - Plan: HIV antibody, IgG, IgA, IgM, CBC with Differential/Platelet, COMPLETE METABOLIC PANEL WITH GFR, Protein electrophoresis, serum  Noncompliance  Needle phobia  Spondylosis of lumbar region without myelopathy or radiculopathy  History of gastroesophageal reflux (GERD)  History of hypertension   Plan: #1: Psoriasis. poorly controlled. No change in psoriasis to bilateral elbows or knees compared to the last visit in February. There is excoriation of the left knee psoriasis secondary to significant/severe itching and discomfort.  #2: Psoriatic arthritis. Poorly controlled with Enbrel; multiple joint hurting. Synovitis and dactylitis to the right third digit of the hand. Low back pain/tightness. Difficult to flex forward secondary to pain. Multiple nails with pitting. Nail dystrophy of bilateral feet. No Achilles tendinitis  #3: High risk prescription Lately, failed Enbrel Previously, failed Humira Unable to use Remicade secondary to unable to use methotrexate  #4: Since patient is doing so poorly, we will offer him Cosentyx to address his joint pain as well as psoriasis.  If he has significant problems with either of these medications, [place] [facility] may be the next option.  #5:  Orders: Orders Placed This Encounter  Procedures  . HIV antibody  . IgG, IgA, IgM  . CBC with Differential/Platelet  . COMPLETE METABOLIC PANEL WITH GFR  .  Protein electrophoresis, serum   No orders of the defined types were placed in this  encounter.   Face-to-face time spent with patient was 30 minutes. 50% of time was spent in counseling and coordination of care.  Follow-Up Instructions: Return in about 3 months (around 10/06/2016) for PsA,Ps,InadeqResponse,HandPain,Jt pain.   Eliezer Lofts, PA-C I examined and evaluated the patient with Eliezer Lofts PA. The plan of care was discussed as noted above. On my exam today patient is still has active synovitis. He also has extensive psoriasis. He has not responded to Enbrel.. Detailed discussion regarding different treatment options and their side effects. We will apply for Cosyntex  described above. Patient agreed to proceed with Cosyntex. Informed consent was obtained today. He will discontinue Enbrel a week prior to Cosyntex injection. Bo Merino, MD Note - This record has been created using Editor, commissioning.  Chart creation errors have been sought, but may not always  have been located. Such creation errors do not reflect on  the standard of medical care.

## 2016-07-06 ENCOUNTER — Ambulatory Visit (INDEPENDENT_AMBULATORY_CARE_PROVIDER_SITE_OTHER): Payer: BLUE CROSS/BLUE SHIELD | Admitting: Rheumatology

## 2016-07-06 ENCOUNTER — Encounter (INDEPENDENT_AMBULATORY_CARE_PROVIDER_SITE_OTHER): Payer: Self-pay

## 2016-07-06 ENCOUNTER — Encounter: Payer: Self-pay | Admitting: Rheumatology

## 2016-07-06 ENCOUNTER — Telehealth: Payer: Self-pay

## 2016-07-06 VITALS — BP 134/76 | HR 76 | Resp 16 | Ht 73.0 in | Wt 244.0 lb

## 2016-07-06 DIAGNOSIS — Z91199 Patient's noncompliance with other medical treatment and regimen due to unspecified reason: Secondary | ICD-10-CM

## 2016-07-06 DIAGNOSIS — L4050 Arthropathic psoriasis, unspecified: Secondary | ICD-10-CM

## 2016-07-06 DIAGNOSIS — Z8679 Personal history of other diseases of the circulatory system: Secondary | ICD-10-CM

## 2016-07-06 DIAGNOSIS — L409 Psoriasis, unspecified: Secondary | ICD-10-CM | POA: Diagnosis not present

## 2016-07-06 DIAGNOSIS — M47816 Spondylosis without myelopathy or radiculopathy, lumbar region: Secondary | ICD-10-CM

## 2016-07-06 DIAGNOSIS — Z9119 Patient's noncompliance with other medical treatment and regimen: Secondary | ICD-10-CM | POA: Diagnosis not present

## 2016-07-06 DIAGNOSIS — Z8719 Personal history of other diseases of the digestive system: Secondary | ICD-10-CM

## 2016-07-06 DIAGNOSIS — F40298 Other specified phobia: Secondary | ICD-10-CM

## 2016-07-06 DIAGNOSIS — Z79899 Other long term (current) drug therapy: Secondary | ICD-10-CM | POA: Diagnosis not present

## 2016-07-06 LAB — CBC WITH DIFFERENTIAL/PLATELET
Basophils Absolute: 41 cells/uL (ref 0–200)
Basophils Relative: 1 %
Eosinophils Absolute: 164 cells/uL (ref 15–500)
Eosinophils Relative: 4 %
HCT: 45.3 % (ref 38.5–50.0)
Hemoglobin: 15.6 g/dL (ref 13.2–17.1)
Lymphocytes Relative: 37 %
Lymphs Abs: 1517 cells/uL (ref 850–3900)
MCH: 30.5 pg (ref 27.0–33.0)
MCHC: 34.4 g/dL (ref 32.0–36.0)
MCV: 88.5 fL (ref 80.0–100.0)
MPV: 8.8 fL (ref 7.5–12.5)
Monocytes Absolute: 656 cells/uL (ref 200–950)
Monocytes Relative: 16 %
Neutro Abs: 1722 cells/uL (ref 1500–7800)
Neutrophils Relative %: 42 %
Platelets: 280 10*3/uL (ref 140–400)
RBC: 5.12 MIL/uL (ref 4.20–5.80)
RDW: 13.5 % (ref 11.0–15.0)
WBC: 4.1 10*3/uL (ref 3.8–10.8)

## 2016-07-06 NOTE — Patient Instructions (Addendum)
Standing Labs We placed an order today for your standing lab work.    Please come back and get your standing labs in  Today and every 2 months until 6 months, ; then every 3 months  We have open lab Monday through Friday from 8:30-11:30 AM and 1:30-4 PM at the office of Dr. Arbutus PedShaili Deveshwar/Kylar Leonhardt, PA.   The office is located at [address], [address], [place], [place] [address] No appointment is necessary.   Labs are drawn by [facility].  You may receive a bill from [facility] for your lab work. If you have any questions regarding directions or hours of operation,  please call [phone].    =========================  Secukinumab injection What is this medicine? SECUKINUMAB (sek ue KIN ue mab) is used to treat psoriasis. It is also used to treat psoriatic arthritis and ankylosing spondylitis. This medicine may be used for other purposes; ask your health care provider or pharmacist if you have questions. COMMON BRAND NAME(S): Cosentyx What should I tell my health care provider before I take this medicine? They need to know if you have any of these conditions: -Crohn's disease, ulcerative colitis, or other inflammatory bowel disease -infection or history of infection -other conditions affecting the immune system -recently received or are scheduled to receive a vaccine -tuberculosis, a positive skin test for tuberculosis, or have recently been in close contact with someone who has tuberculosis -an unusual or allergic reaction to secukinumab, other medicines, latex, rubber, foods, dyes, or preservatives -pregnant or trying to get pregnant -breast-feeding How should I use this medicine? This medicine is for injection under the skin. It may be administered by a healthcare professional in a hospital or clinic setting or at home. If you get this medicine at home, you will be taught how to prepare and give this medicine. Use exactly as directed. Take your medicine at regular  intervals. Do not take your medicine more often than directed. It is important that you put your used needles and syringes in a special sharps container. Do not put them in a trash can. If you do not have a sharps container, call your pharmacist or healthcare provider to get one. A special MedGuide will be given to you by the pharmacist with each prescription and refill. Be sure to read this information carefully each time. Talk to your pediatrician regarding the use of this medicine in children. Special care may be needed. Overdosage: If you think you have taken too much of this medicine contact a poison control center or emergency room at once. NOTE: This medicine is only for you. Do not share this medicine with others. What if I miss a dose? It is important not to miss your dose. Call your doctor of health care professional if you are unable to keep an appointment. If you give yourself the medicine and you miss a dose, take it as soon as you can. If it is almost time for your next dose, take only that dose. Do not take double or extra doses. What may interact with this medicine? Do not take this medicine with any of the following medications: -live virus vaccines This medicine may also interact with the following medications: -cyclosporine -inactivated vaccines -warfarin This list may not describe all possible interactions. Give your health care provider a list of all the medicines, herbs, non-prescription drugs, or dietary supplements you use. Also tell them if you smoke, drink alcohol, or use illegal drugs. Some items may interact with your medicine. What  should I watch for while using this medicine? Tell your doctor or healthcare professional if your symptoms do not start to get better or if they get worse. You will be tested for tuberculosis (TB) before you start this medicine. If your doctor prescribes any medicine for TB, you should start taking the TB medicine before starting this  medicine. Make sure to finish the full course of TB medicine. Call your doctor or healthcare professional for advice if you get a fever, chills or sore throat, or other symptoms of a cold or flu. Do not treat yourself. This drug decreases your body's ability to fight infections. Try to avoid being around people who are sick. This medicine can decrease the response to a vaccine. If you need to get vaccinated, tell your healthcare professional if you have received this medicine within the last 6 months. Extra booster doses may be needed. Talk to your doctor to see if a different vaccination schedule is needed. What side effects may I notice from receiving this medicine? Side effects that you should report to your doctor or health care professional as soon as possible: -allergic reactions like skin rash, itching or hives, swelling of the face, lips, or tongue -signs and symptoms of infection like fever or chills; cough; sore throat; pain or trouble passing urine Side effects that usually do not require medical attention (report to your doctor or health care professional if they continue or are bothersome): -diarrhea This list may not describe all possible side effects. Call your doctor for medical advice about side effects. You may report side effects to FDA at 1-800-FDA-1088. Where should I keep my medicine? Keep out of the reach of children. Store the prefilled syringe or injection pen in a refrigerator between 2 to 8 degrees C (36 to 46 degrees F). Keep the syringe or the pen in the original carton until ready for use. Protect from light. Do not freeze. Do not shake. Prior to use, remove the syringe or pen from the refrigerator and use within 1 hour. Throw away any unused medicine after the expiration date on the label. NOTE: This sheet is a summary. It may not cover all possible information. If you have questions about this medicine, talk to your doctor, pharmacist, or health care provider.  2018  Elsevier/Gold Standard (2015-02-21 11:48:31)

## 2016-07-06 NOTE — Telephone Encounter (Signed)
A prior authorization was submitted for cosentyx through cover my meds. Will update once we receive a response.   Joyanne Eddinger, [place], CPhT 11:22 AM

## 2016-07-06 NOTE — Assessment & Plan Note (Signed)
07/06/2016: Inadequate response from Enbrel.; Apply for Cosentyx.

## 2016-07-06 NOTE — Progress Notes (Signed)
Pharmacy Note  Subjective:  Patient presents today to the [facility] to see Dr. Gabrielle Dareeveshwar/Mr. Panwala.  Patient is currently taking Enbrel 50 mg weekly and continuing to have joint pain/stiffness.  Decision was made to change patient to Cosentyx.  Patient was seen by the pharmacist for counseling on Cosentyx.  Objective:  CBC    Component Value Date/Time   WBC 5.4 03/16/2016 0938   RBC 5.07 03/16/2016 0938   HGB 15.2 03/16/2016 0938   HCT 45.8 03/16/2016 0938   PLT 304 03/16/2016 0938   MCV 90.3 03/16/2016 0938   MCH 30.0 03/16/2016 0938   MCHC 33.2 03/16/2016 0938   RDW 14.5 03/16/2016 0938   LYMPHSABS 2,268 03/16/2016 0938   MONOABS 540 03/16/2016 0938   EOSABS 378 03/16/2016 0938   BASOSABS 0 03/16/2016 0938   CMP     Component Value Date/Time   NA 137 03/16/2016 0938   NA 137 10/17/2015   K 4.6 03/16/2016 0938   CL 105 03/16/2016 0938   CO2 24 03/16/2016 0938   GLUCOSE 106 (H) 03/16/2016 0938   BUN 10 03/16/2016 0938   BUN 8 10/17/2015   CREATININE 1.21 03/16/2016 0938   CALCIUM 9.3 03/16/2016 0938   PROT 7.2 03/16/2016 0938   ALBUMIN 4.3 03/16/2016 0938   AST 32 03/16/2016 0938   ALT 67 (H) 03/16/2016 0938   ALKPHOS 57 03/16/2016 0938   BILITOT 0.3 03/16/2016 0938   GFRNONAA 76 03/16/2016 0938   GFRAA 88 03/16/2016 0938   TB Gold: negative (10/15/15) Hepatitis panel: negative (08/22/12)  Assessment/Plan:  Counseled patient that Cosentyx is a IL-17 inhibitor that works to reduce pain and inflammation associated with arthritis.  Counseled patient on purpose, proper use, and adverse effects of Cosentyx. Reviewed the most common adverse effects of infection, inflammatory bowel disease, and allergic reaction.  Provided patient with medication education material and answered all questions.  Patient consented to Cosentyx.  Will upload consent into patient's chart.    Will apply for Cosentyx through patient's insurance.  I assisted patient in applying for  Cosentyx copay card (ID [id number], GRP [id number], BIN [id number], PCN [id number]).  Reviewed storage information for Cosentyx.  Advised patient that he will need a nursing visit for the initial Cosentyx injection at least one week after his most recent Enbrel injection.  Patient voiced understanding.    Lilla Shookachel Polette Nofsinger, Pharm.D., BCPS Clinical Pharmacist Pager: [phone] Phone: [phone] 07/06/2016 8:42 AM

## 2016-07-07 LAB — COMPLETE METABOLIC PANEL WITH GFR
ALT: 54 U/L — ABNORMAL HIGH (ref 9–46)
AST: 26 U/L (ref 10–40)
Albumin: 4.2 g/dL (ref 3.6–5.1)
Alkaline Phosphatase: 57 U/L (ref 40–115)
BUN: 10 mg/dL (ref 7–25)
CO2: 21 mmol/L (ref 20–31)
Calcium: 9.6 mg/dL (ref 8.6–10.3)
Chloride: 106 mmol/L (ref 98–110)
Creat: 1.14 mg/dL (ref 0.60–1.35)
GFR, Est African American: >89 mL/min (ref >=60)
GFR, Est Non African American: 81 mL/min (ref >=60)
Glucose, Bld: 99 mg/dL (ref 65–99)
Potassium: 4.8 mmol/L (ref 3.5–5.3)
Sodium: 140 mmol/L (ref 135–146)
Total Bilirubin: 0.4 mg/dL (ref 0.2–1.2)
Total Protein: 7.1 g/dL (ref 6.1–8.1)

## 2016-07-07 LAB — IGG, IGA, IGM
IgA: 301 mg/dL (ref 81–463)
IgG (Immunoglobin G), Serum: 1330 mg/dL (ref 694–1618)
IgM, Serum: 41 mg/dL — ABNORMAL LOW (ref 48–271)

## 2016-07-07 LAB — HIV ANTIBODY (ROUTINE TESTING W REFLEX): HIV 1&2 Ab, 4th Generation: NONREACTIVE

## 2016-07-07 NOTE — Telephone Encounter (Signed)
Received a confirmation from [facility] regarding a DENIAL for Cosentyx. Plan prefers patient to try Humira, Stelara or Taltz first.  Will send document to scan center.  Rusti Arizmendi, [place], CPhT 8:15 AM

## 2016-07-07 NOTE — Telephone Encounter (Signed)
Cosentyx was denied for patient.  Plan prefers Runner, broadcasting/film/videoTaltz.  Okay to apply for Taltz?

## 2016-07-07 NOTE — Telephone Encounter (Signed)
ok 

## 2016-07-07 NOTE — Telephone Encounter (Signed)
I spoke to patient.  Advised him of Cosentyx denial.  Counseled on purpose, proper use, and adverse effects of Taltz.  Patient is willing to apply for Taltz.  Will need to consent him with first Taltz injection.    Patient reports he is due for Enbrel today.  I advised patient to continue Enbrel today since we are still waiting on approval from his insurance.  Advised we must wait one week after most recent Enbrel before initiation of Taltz.    Can you apply for Taltz?  Dose will be SubQ: 160 mg once, followed by 80 mg at weeks 2, 4, 6, 8, 10 and 12, and then 80 mg every 4 weeks

## 2016-07-08 ENCOUNTER — Telehealth: Payer: Self-pay

## 2016-07-08 LAB — PROTEIN ELECTROPHORESIS, SERUM
Albumin ELP: 4.3 g/dL (ref 3.8–4.8)
Alpha-1-Globulin: 0.2 g/dL (ref 0.2–0.3)
Alpha-2-Globulin: 0.5 g/dL (ref 0.5–0.9)
Beta 2: 0.4 g/dL (ref 0.2–0.5)
Beta Globulin: 0.4 g/dL (ref 0.4–0.6)
Gamma Globulin: 1.2 g/dL (ref 0.8–1.7)
Total Protein, Serum Electrophoresis: 7.1 g/dL (ref 6.1–8.1)

## 2016-07-08 MED ORDER — SECUKINUMAB 150 MG/ML ~~LOC~~ SOAJ: 300.0000 mg | SUBCUTANEOUS | 0 refills | Status: DC

## 2016-07-08 MED FILL — COSENTYX 300 MG DOSE-2 PENS: 150 | 35 days supply | Qty: 10 | Fill #0

## 2016-07-08 NOTE — Addendum Note (Signed)
Addended by: Chesley MiresHENDERSON, Sircharles Holzheimer L on: 07/08/2016 04:54 PM   Modules accepted: Orders

## 2016-07-08 NOTE — Telephone Encounter (Signed)
ERROR

## 2016-07-08 NOTE — Telephone Encounter (Signed)
Spoke with a representative from [employer] who states that the authorization for Cosentyx has been approved. (maintenance and loading dose) . The loading dose is approved through July 11th and the maintenance dose has been approved from 07/08/16-6/620.   Authorization number: [id number] Phone: [phone]  Abran DukeHopkins, Lether Tesch, CPhT 3:31 PM

## 2016-07-08 NOTE — Telephone Encounter (Signed)
I called patient and informed him that Cosentyx was approved by his insurance.  Cosentyx prescription was sent to the pharmacy for delivery to clinic.  Patient confirms he did not take Enbrel this week.  Last Enbrel injection was 06/30/16.  Patient scheduled nurse visit for Monday, 07/13/16 at 9 AM for initial Cosentyx injection.   Lilla Shookachel Devarius Nelles, Pharm.D., BCPS, CPP Clinical Pharmacist Pager: [phone] Phone: [phone] 07/08/2016 4:49 PM

## 2016-07-09 ENCOUNTER — Telehealth: Payer: Self-pay

## 2016-07-09 NOTE — Telephone Encounter (Signed)
[facility] [employer]   Elona Yinger, [place], CPhT 4:35 PM

## 2016-07-10 ENCOUNTER — Telehealth: Payer: Self-pay | Admitting: Radiology

## 2016-07-10 NOTE — Telephone Encounter (Signed)
-----   Message from [place]aitik Panwala, [place] sent at 07/08/2016  5:39 PM EDT ----- I am sending a copy of labs to PCP Tell patient #1: SPEP is negative #2: HIV is negative #3: CBC with differential is negative #4: Immunoglobulins are within normal limits except for slight decrease in IgM serum at 41 and set of at 48 (we will monitor) #5: CMP with GFR is within normal limits except for ALT slightly elevated at 54. Note during the office visit, patient reported that he had been drinking quite a bit over the last few days and he said that his liver numbers might be elevated. I offered the patient that we can do his blood work a few days later since he already has known use of alcohol recently and his ALT or AST might be elevated. He reported that he plans to do more drinking for different celebration and it would be better to go ahead and get the blood drawn today.

## 2016-07-10 NOTE — Telephone Encounter (Signed)
Called pt to advise encouraged him to decrease ETOH, he states he is on vacation, and has had more than usual. Will decrease soon.

## 2016-07-13 ENCOUNTER — Ambulatory Visit (INDEPENDENT_AMBULATORY_CARE_PROVIDER_SITE_OTHER): Payer: BLUE CROSS/BLUE SHIELD | Admitting: *Deleted

## 2016-07-13 VITALS — BP 136/86 | HR 73

## 2016-07-13 DIAGNOSIS — L4050 Arthropathic psoriasis, unspecified: Secondary | ICD-10-CM | POA: Diagnosis not present

## 2016-07-13 MED ORDER — SECUKINUMAB 150 MG/ML ~~LOC~~ SOSY
300.0000 mg | PREFILLED_SYRINGE | Freq: Once | SUBCUTANEOUS | Status: AC
  Administered 2016-07-13: 300 mg via SUBCUTANEOUS

## 2016-07-13 NOTE — Progress Notes (Signed)
Pharmacy Note  Subjective:   Patient is being initiated on Cosentyx.  Patient was previously counseled extensively on Cosentyx on 07/06/16 and consented to initiation of Cosentyx at that time.  Patient presents to clinic today to receive the first dose of Cosentyx.  He confirms his last dose of Enbrel was greater than 1 week ago.    Objective: CMP     Component Value Date/Time   NA 140 07/06/2016 0918   NA 137 10/17/2015   K 4.8 07/06/2016 0918   CL 106 07/06/2016 0918   CO2 21 07/06/2016 0918   GLUCOSE 99 07/06/2016 0918   BUN 10 07/06/2016 0918   BUN 8 10/17/2015   CREATININE 1.14 07/06/2016 0918   CALCIUM 9.6 07/06/2016 0918   PROT 7.1 07/06/2016 0918   ALBUMIN 4.2 07/06/2016 0918   AST 26 07/06/2016 0918   ALT 54 (H) 07/06/2016 0918   ALKPHOS 57 07/06/2016 0918   BILITOT 0.4 07/06/2016 0918   GFRNONAA 81 07/06/2016 0918   GFRAA >89 07/06/2016 0918   CBC    Component Value Date/Time   WBC 4.1 07/06/2016 0918   RBC 5.12 07/06/2016 0918   HGB 15.6 07/06/2016 0918   HCT 45.3 07/06/2016 0918   PLT 280 07/06/2016 0918   MCV 88.5 07/06/2016 0918   MCH 30.5 07/06/2016 0918   MCHC 34.4 07/06/2016 0918   RDW 13.5 07/06/2016 0918   LYMPHSABS 1,517 07/06/2016 0918   MONOABS 656 07/06/2016 0918   EOSABS 164 07/06/2016 0918   BASOSABS 41 07/06/2016 0918   TB Gold: negative (10/15/15)  Assessment/Plan:  Patient was counseled on how to administer subcutaneous Cosentyx injection using a demonstration pen.  Cosentyx 300 mg dose was administered in clinic.  The medication was administered in both legs.  Lot: [id number]: [id number], Exp. 12/2017.  Patient was monitored for 30 minutes post injection.  No injection site reaction noted.  Patient will need standing lab orders in one month.  Provided patient with standing lab instructions and placed standing lab order.  Patient has follow up visit scheduled for 10/12/16.    Lilla Shookachel Nate Common, Pharm.D., BCPS Clinical Pharmacist Pager: [phone] Phone:  [phone] 07/13/2016 8:37 AM

## 2016-07-13 NOTE — Progress Notes (Signed)
Patient in office to start Cosentyx. Patient self injected Cosentyx into each thigh. This is patient's prescription of Cosentyx. Patient tolerated injections well. Patient was monitored in office for 30 minutes after administration for adverse reactions. No adverse reactions noted.   Administrations This Visit    Secukinumab SOSY 300 mg    Admin Date 07/13/2016 Action Given Dose 300 mg Route Subcutaneous Administered By Henriette CombsHatton, Lindsie Simar L, LPN

## 2016-07-13 NOTE — Patient Instructions (Addendum)
Inject Cosentyx 300 mg weekly for 5 weeks (today, 07/20/16, 07/27/16, 08/03/16, 08/10/16), then every 4 weeks starting on 09/07/16  Standing Labs We placed an order today for your standing lab work.    Please come back and get your standing labs in 1 month then every 2 months  We have open lab Monday through Friday from 8:30-11:30 AM and 1:30-4 PM at the office of Dr. Arbutus PedShaili Deveshwar/Naitik Panwala, PA.   The office is located at [address], [address], [place], [place] [address] No appointment is necessary.   Labs are drawn by [facility].  You may receive a bill from [facility] for your lab work. If you have any questions regarding directions or hours of operation,  please call [phone].    Secukinumab injection What is this medicine? SECUKINUMAB (sek ue KIN ue mab) is used to treat psoriasis. It is also used to treat psoriatic arthritis and ankylosing spondylitis. This medicine may be used for other purposes; ask your health care provider or pharmacist if you have questions. COMMON BRAND NAME(S): Cosentyx What should I tell my health care provider before I take this medicine? They need to know if you have any of these conditions: -Crohn's disease, ulcerative colitis, or other inflammatory bowel disease -infection or history of infection -other conditions affecting the immune system -recently received or are scheduled to receive a vaccine -tuberculosis, a positive skin test for tuberculosis, or have recently been in close contact with someone who has tuberculosis -an unusual or allergic reaction to secukinumab, other medicines, latex, rubber, foods, dyes, or preservatives -pregnant or trying to get pregnant -breast-feeding How should I use this medicine? This medicine is for injection under the skin. It may be administered by a healthcare professional in a hospital or clinic setting or at home. If you get this medicine at home, you will be taught how to prepare and give this medicine. Use  exactly as directed. Take your medicine at regular intervals. Do not take your medicine more often than directed. It is important that you put your used needles and syringes in a special sharps container. Do not put them in a trash can. If you do not have a sharps container, call your pharmacist or healthcare provider to get one. A special MedGuide will be given to you by the pharmacist with each prescription and refill. Be sure to read this information carefully each time. Talk to your pediatrician regarding the use of this medicine in children. Special care may be needed. Overdosage: If you think you have taken too much of this medicine contact a poison control center or emergency room at once. NOTE: This medicine is only for you. Do not share this medicine with others. What if I miss a dose? It is important not to miss your dose. Call your doctor of health care professional if you are unable to keep an appointment. If you give yourself the medicine and you miss a dose, take it as soon as you can. If it is almost time for your next dose, take only that dose. Do not take double or extra doses. What may interact with this medicine? Do not take this medicine with any of the following medications: -live virus vaccines This medicine may also interact with the following medications: -cyclosporine -inactivated vaccines -warfarin This list may not describe all possible interactions. Give your health care provider a list of all the medicines, herbs, non-prescription drugs, or dietary supplements you use. Also tell them if you smoke, drink alcohol, or use  illegal drugs. Some items may interact with your medicine. What should I watch for while using this medicine? Tell your doctor or healthcare professional if your symptoms do not start to get better or if they get worse. You will be tested for tuberculosis (TB) before you start this medicine. If your doctor prescribes any medicine for TB, you should start  taking the TB medicine before starting this medicine. Make sure to finish the full course of TB medicine. Call your doctor or healthcare professional for advice if you get a fever, chills or sore throat, or other symptoms of a cold or flu. Do not treat yourself. This drug decreases your body's ability to fight infections. Try to avoid being around people who are sick. This medicine can decrease the response to a vaccine. If you need to get vaccinated, tell your healthcare professional if you have received this medicine within the last 6 months. Extra booster doses may be needed. Talk to your doctor to see if a different vaccination schedule is needed. What side effects may I notice from receiving this medicine? Side effects that you should report to your doctor or health care professional as soon as possible: -allergic reactions like skin rash, itching or hives, swelling of the face, lips, or tongue -signs and symptoms of infection like fever or chills; cough; sore throat; pain or trouble passing urine Side effects that usually do not require medical attention (report to your doctor or health care professional if they continue or are bothersome): -diarrhea This list may not describe all possible side effects. Call your doctor for medical advice about side effects. You may report side effects to FDA at 1-800-FDA-1088. Where should I keep my medicine? Keep out of the reach of children. Store the prefilled syringe or injection pen in a refrigerator between 2 to 8 degrees C (36 to 46 degrees F). Keep the syringe or the pen in the original carton until ready for use. Protect from light. Do not freeze. Do not shake. Prior to use, remove the syringe or pen from the refrigerator and use within 1 hour. Throw away any unused medicine after the expiration date on the label. NOTE: This sheet is a summary. It may not cover all possible information. If you have questions about this medicine, talk to your doctor,  pharmacist, or health care provider.  2018 Elsevier/Gold Standard (2015-02-21 11:48:31)

## 2016-08-17 ENCOUNTER — Telehealth: Payer: Self-pay | Admitting: Pharmacist

## 2016-08-17 ENCOUNTER — Other Ambulatory Visit: Payer: Self-pay

## 2016-08-17 DIAGNOSIS — Z79899 Other long term (current) drug therapy: Secondary | ICD-10-CM

## 2016-08-17 LAB — CBC WITH DIFFERENTIAL/PLATELET
Basophils Absolute: 47 cells/uL (ref 0–200)
Basophils Relative: 1 %
Eosinophils Absolute: 235 cells/uL (ref 15–500)
Eosinophils Relative: 5 %
HCT: 45.4 % (ref 38.5–50.0)
Hemoglobin: 15.9 g/dL (ref 13.2–17.1)
Lymphocytes Relative: 35 %
Lymphs Abs: 1645 cells/uL (ref 850–3900)
MCH: 30.8 pg (ref 27.0–33.0)
MCHC: 35 g/dL (ref 32.0–36.0)
MCV: 88 fL (ref 80.0–100.0)
MPV: 9.2 fL (ref 7.5–12.5)
Monocytes Absolute: 470 cells/uL (ref 200–950)
Monocytes Relative: 10 %
Neutro Abs: 2303 cells/uL (ref 1500–7800)
Neutrophils Relative %: 49 %
Platelets: 309 10*3/uL (ref 140–400)
RBC: 5.16 MIL/uL (ref 4.20–5.80)
RDW: 13.3 % (ref 11.0–15.0)
WBC: 4.7 10*3/uL (ref 3.8–10.8)

## 2016-08-17 LAB — COMPLETE METABOLIC PANEL WITH GFR
ALT: 60 U/L — ABNORMAL HIGH (ref 9–46)
AST: 28 U/L (ref 10–40)
Albumin: 4.4 g/dL (ref 3.6–5.1)
Alkaline Phosphatase: 55 U/L (ref 40–115)
BUN: 14 mg/dL (ref 7–25)
CO2: 25 mmol/L (ref 20–31)
Calcium: 9.4 mg/dL (ref 8.6–10.3)
Chloride: 102 mmol/L (ref 98–110)
Creat: 1.31 mg/dL (ref 0.60–1.35)
GFR, Est African American: 79 mL/min (ref >=60)
GFR, Est Non African American: 69 mL/min (ref >=60)
Glucose, Bld: 150 mg/dL — ABNORMAL HIGH (ref 65–99)
Potassium: 4.5 mmol/L (ref 3.5–5.3)
Sodium: 137 mmol/L (ref 135–146)
Total Bilirubin: 0.5 mg/dL (ref 0.2–1.2)
Total Protein: 7.3 g/dL (ref 6.1–8.1)

## 2016-08-17 MED ORDER — SECUKINUMAB 150 MG/ML ~~LOC~~ SOAJ: 300.0000 mg | SUBCUTANEOUS | 0 refills | Status: DC

## 2016-08-17 NOTE — Progress Notes (Signed)
Glucose is elevated rest of the labs are normal. Please notify patient

## 2016-08-17 NOTE — Telephone Encounter (Signed)
ok 

## 2016-08-17 NOTE — Telephone Encounter (Signed)
Patient came into the office today for labs.  He had questions regarding Cosentyx loading dose and maintenance therapy.  Patient was started on Cosentyx on 07/13/16.  He was scheduled to complete loading dose on 08/10/16.  Patient confirms he did take last dose of Cosentyx load last Monday.  I advised patient he is scheduled to start Cosetnyx maintenance dose of 300 mg every 4 weeks on 09/07/16.  The pharmacy will contact him one week before he is due for next Cosentyx dose.    Last visit: 07/06/16 Next visit: 10/12/16 Labs: 07/06/16 CBC normal, CMP normal except for ALT 54 Updated CBC, CMP drawn today 10/15/15 TB Gold negative  Okay to send in maintenance prescription for Cosentyx?    Lilla Shookachel Nolan Tuazon, Pharm.D., BCPS, CPP Clinical Pharmacist Pager: [phone] Phone: [phone] 08/17/2016 9:06 AM

## 2016-09-01 MED FILL — COSENTYX 300 MG DOSE-2 PENS: 150 | 28 days supply | Qty: 2 | Fill #0

## 2016-09-21 ENCOUNTER — Other Ambulatory Visit: Payer: Self-pay | Admitting: Cardiology

## 2016-09-21 ENCOUNTER — Ambulatory Visit
Admission: RE | Admit: 2016-09-21 | Discharge: 2016-09-21 | Disposition: A | Discharge status: Home / Self Care | Payer: BLUE CROSS/BLUE SHIELD | Source: Ambulatory Visit | Attending: Cardiology | Admitting: Cardiology

## 2016-09-21 DIAGNOSIS — R0789 Other chest pain: Secondary | ICD-10-CM

## 2016-09-23 MED FILL — COSENTYX 300 MG DOSE-2 PENS: 150 | 28 days supply | Qty: 2 | Fill #1

## 2016-10-11 NOTE — Progress Notes (Signed)
Office Visit Note  Patient: Jacob Massey             Date of Birth: [birth year]           MRN: [id number]             PCP: Merri BrunettePharr, Walter, MD Referring: Merri BrunettePharr, Walter, MD Visit Date: 10/19/2016 Occupation: @GUAROCC @    Subjective:  Pain right middle finger.   History of Present Illness: Jacob Massey is a 36 y.o. male with history of psoriatic arthritis and psoriasis. He's been on Cosyntex service 07/13/2016. He states he has occasional discomfort in his right middle finger. About a month ago he had some swelling in his right wrist. The psoriasis is resolved. He's been going to the chiropractor once a month for upper back pain.  Activities of Daily Living:  Patient reports morning stiffness for 5 minutes.   Patient Denies nocturnal pain.  Difficulty dressing/grooming: Denies Difficulty climbing stairs: Denies Difficulty getting out of chair: Denies Difficulty using hands for taps, buttons, cutlery, and/or writing: Denies   Review of Systems  Constitutional: Negative for fatigue, night sweats and weakness ( ).  HENT: Negative for mouth sores, mouth dryness and nose dryness.   Eyes: Negative for redness and dryness.  Respiratory: Positive for shortness of breath. Negative for difficulty breathing.        On exertion. Cardio w/u negative per pt.  Cardiovascular: Negative for chest pain, palpitations, hypertension, irregular heartbeat and swelling in legs/feet.  Gastrointestinal: Negative for constipation and diarrhea.  Endocrine: Negative for increased urination.  Musculoskeletal: Positive for arthralgias, joint pain, joint swelling and morning stiffness. Negative for myalgias, muscle weakness, muscle tenderness and myalgias.  Skin: Negative for color change, rash, hair loss, nodules/bumps, skin tightness, ulcers and sensitivity to sunlight.  Allergic/Immunologic: Negative for susceptible to infections.  Neurological: Negative for dizziness, fainting, memory loss and night  sweats.  Hematological: Negative for swollen glands.  Psychiatric/Behavioral: Positive for depressed mood. Negative for sleep disturbance. The patient is not nervous/anxious.     PMFS History:  Patient Active Problem List   Diagnosis Date Noted  . High risk medication use 06/22/2016  . History of gastroesophageal reflux (GERD) 03/12/2016  . History of hypertension 03/12/2016  . 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 Psoriasis 12/03/2015  . Psoriatic arthritis (HCC) 12/03/2015  . Bicipital tendinitis 12/03/2015  . Hypertension 12/03/2015  . GERD (gastroesophageal reflux disease) 12/03/2015  . ADHD 12/03/2015  . Genital herpes 12/03/2015  . Depression 12/03/2015  . Noncompliance 12/03/2015  . Needle phobia 12/03/2015    Past Medical History:  Diagnosis Date  . ADHD 12/03/2015  . Bicipital tendinitis 12/03/2015  . Genital herpes 12/03/2015  . GERD (gastroesophageal reflux disease) 12/03/2015  . Hypertension 12/03/2015  . Psoriasis 12/03/2015  . Psoriatic arthritis (HCC) 12/03/2015  . Sinusitis   . Tendonitis     No family history on file. Past Surgical History:  Procedure Laterality Date  . VASECTOMY     Social History   Social History Narrative  . No narrative on file     Objective: Vital Signs: BP 118/70   Pulse 74   Resp 14   Ht 6\' 1"  (1.854 m)   Wt 241 lb (109.3 kg)   BMI 31.80 kg/m    Physical Exam  Constitutional: He is oriented to person, place, and time. He appears well-developed and well-nourished.  HENT:  Head: Normocephalic and atraumatic.  Eyes: Pupils are equal, round, and reactive to light.  Conjunctivae and EOM are normal.  Neck: Normal range of motion. Neck supple.  Cardiovascular: Normal rate, regular rhythm and normal heart sounds.   Pulmonary/Chest: Effort normal and breath sounds normal.  Abdominal: Soft. Bowel sounds are normal.  Neurological: He is alert and oriented to person, place, and time.    Skin: Skin is warm and dry. Capillary refill takes less than 2 seconds.  Psychiatric: He has a normal mood and affect. His behavior is normal.  Nursing note and vitals reviewed.    Musculoskeletal Exam: C-spine and thoracic lumbar spine good range of motion. He has no SI joint tenderness. Shoulder joints elbow joints wrist joints are good range of motion. He has some thickening and tenderness over right third finger PIP and DIP joint. All the joints of range of motion with no synovitis.  CDAI Exam: CDAI Homunculus Exam:   Tenderness:  Right hand: 3rd PIP and 3rd DIP  Joint Counts:  CDAI Tender Joint count: 1 CDAI Swollen Joint count: 0  Global Assessments:  Patient Global Assessment: 3 Provider Global Assessment: 3  CDAI Calculated Score: 7    Investigation: No additional findings.TB Gold: Negative in 10/2015 CBC Latest Ref Rng & Units 08/17/2016 07/06/2016 03/16/2016  WBC 3.8 - 10.8 K/uL 4.7 4.1 5.4  Hemoglobin 13.2 - 17.1 g/dL 16.1 09.6 04.5  Hematocrit 38.5 - 50.0 % 45.4 45.3 45.8  Platelets 140 - 400 K/uL 309 280 304   CMP Latest Ref Rng & Units 08/17/2016 07/06/2016 03/16/2016  Glucose 65 - 99 mg/dL 409(W) 99 119(J)  BUN 7 - 25 mg/dL Creatinine 0.60 - 1.35 mg/dL 4.78 2.95 6.21  Sodium 135 - 146 mmol/L 137 140 137  Potassium 3.5 - 5.3 mmol/L 4.5 4.8 4.6  Chloride 98 - 110 mmol/L 102 106 105  CO2 20 - 31 mmol/L Calcium 8.6 - 10.3 mg/dL 9.4 9.6 9.3  Total Protein 6.1 - 8.1 g/dL 7.3 7.1 7.2  Total Bilirubin 0.2 - 1.2 mg/dL 0.5 0.4 0.3  Alkaline Phos 40 - 115 U/L 55 57 57  AST 10 - 40 U/L 28 26 32  ALT 9 - 46 U/L 60(H) 54(H) 67(H)    Imaging: Dg Chest 2 View  Result Date: 09/21/2016 CLINICAL DATA:  36 year old male with pain in the chest, upper back and left arm for about 1 year. Hypertension appear EXAM: CHEST  2 VIEW COMPARISON:  08/22/2012 in FINDINGS: Lower lung volumes. Mediastinal contours remain within normal limits. Visualized tracheal air  column is within normal limits. No pneumothorax, pulmonary edema, pleural effusion or confluent pulmonary opacity. Negative visible bowel gas pattern. No osseous abnormality identified. IMPRESSION: Lower lung volume but otherwise negative. No acute cardiopulmonary abnormality. Electronically Signed   By: Odessa Fleming M.D.   On: 09/21/2016 12:33    Speciality Comments: No specialty comments available.    Procedures:  No procedures performed Allergies: Methotrexate derivatives   Assessment / Plan:     Visit Diagnoses: Psoriatic arthritis (HCC): History much better on Cosyntex subcutaneous. Although he has some discomfort in his right third finger. I'm uncertain if it's due to underlying damage or active inflammation. He did have some tenderness on palpation but no synovitis was appreciated. I discussed giving him a short course of prednisone and see if symptoms will improve. Side effects of prednisone were discussed.  Other psoriasis - rash, nail pitting and nail dystrophy. He had no active rash on exam today. He still have some nail pitting and  dystrophy.  High risk medication use - cosentyx 300 mg sq q month(07/13/2016)[ Humira, Enbrel-inadequate response] TB gold is due. 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  History of gastroesophageal reflux (GERD)  History of hypertension: His blood pressure is well controlled.  History of ADHD  History of depression  Obesity (BMI 30.0-34.9) : Weight loss diet and exercise was discussed.   Orders: Orders Placed This Encounter  Procedures  . CBC with Differential/Platelet  . COMPLETE METABOLIC PANEL WITH GFR  . Quantiferon tb gold assay (blood)   Meds ordered this encounter  Medications  . predniSONE (DELTASONE) 5 MG  tablet    Sig: 4 tab q am x2days, 3 tab q am x2d, 2 tab q amx 2days, 1 tab q am x2d ,1/2 tab qam x2d then  d/c    Dispense:  21 tablet    Refill:  0     Follow-Up Instructions: Return for Psoriatic arthritis.   Pollyann Savoy, MD  Note - This record has been created using Animal nutritionist.  Chart creation errors have been sought, but may not always  have been located. Such creation errors do not reflect on  the standard of medical care.

## 2016-10-12 ENCOUNTER — Ambulatory Visit: Payer: BLUE CROSS/BLUE SHIELD | Admitting: Rheumatology

## 2016-10-19 ENCOUNTER — Ambulatory Visit (INDEPENDENT_AMBULATORY_CARE_PROVIDER_SITE_OTHER): Payer: BLUE CROSS/BLUE SHIELD | Admitting: Rheumatology

## 2016-10-19 ENCOUNTER — Encounter: Payer: Self-pay | Admitting: Rheumatology

## 2016-10-19 VITALS — BP 118/70 | HR 74 | Resp 14 | Ht 73.0 in | Wt 241.0 lb

## 2016-10-19 DIAGNOSIS — M5134 Other intervertebral disc degeneration, thoracic region: Secondary | ICD-10-CM | POA: Diagnosis not present

## 2016-10-19 DIAGNOSIS — L408 Other psoriasis: Secondary | ICD-10-CM | POA: Diagnosis not present

## 2016-10-19 DIAGNOSIS — E669 Obesity, unspecified: Secondary | ICD-10-CM | POA: Diagnosis not present

## 2016-10-19 DIAGNOSIS — L4050 Arthropathic psoriasis, unspecified: Secondary | ICD-10-CM

## 2016-10-19 DIAGNOSIS — Z79899 Other long term (current) drug therapy: Secondary | ICD-10-CM | POA: Diagnosis not present

## 2016-10-19 DIAGNOSIS — Z8679 Personal history of other diseases of the circulatory system: Secondary | ICD-10-CM

## 2016-10-19 DIAGNOSIS — Z8719 Personal history of other diseases of the digestive system: Secondary | ICD-10-CM

## 2016-10-19 DIAGNOSIS — Z8659 Personal history of other mental and behavioral disorders: Secondary | ICD-10-CM

## 2016-10-19 MED ORDER — PREDNISONE 5 MG PO TABS: ORAL_TABLET | ORAL | 0 refills | Status: DC

## 2016-10-19 NOTE — Patient Instructions (Signed)
Standing Labs We placed an order today for your standing lab work.    Please come back and get your standing labs in December and every 3 months  We have open lab Monday through Friday from 8:30-11:30 AM and 1:30-4 PM at the office of Dr. Sherlon Nied.   The office is located at [address], [address], [place], [place] [address] No appointment is necessary.   Labs are drawn by [facility].  You may receive a bill from [facility] for your lab work. If you have any questions regarding directions or hours of operation,  please call [phone].    

## 2016-10-20 NOTE — Progress Notes (Signed)
ALTs  still mildly elevated. Other labs are within normal limits.

## 2016-10-21 LAB — CBC WITH DIFFERENTIAL/PLATELET
Basophils Absolute: 51 cells/uL (ref 0–200)
Basophils Relative: 1 %
Eosinophils Absolute: 270 cells/uL (ref 15–500)
Eosinophils Relative: 5.3 %
HCT: 46.2 % (ref 38.5–50.0)
Hemoglobin: 16 g/dL (ref 13.2–17.1)
Lymphs Abs: 1418 cells/uL (ref 850–3900)
MCH: 30.2 pg (ref 27.0–33.0)
MCHC: 34.6 g/dL (ref 32.0–36.0)
MCV: 87.2 fL (ref 80.0–100.0)
MPV: 9.3 fL (ref 7.5–12.5)
Monocytes Relative: 8.1 %
Neutro Abs: 2948 cells/uL (ref 1500–7800)
Neutrophils Relative %: 57.8 %
Platelets: 324 10*3/uL (ref 140–400)
RBC: 5.3 10*6/uL (ref 4.20–5.80)
RDW: 13.4 % (ref 11.0–15.0)
Total Lymphocyte: 27.8 %
WBC mixed population: 413 cells/uL (ref 200–950)
WBC: 5.1 10*3/uL (ref 3.8–10.8)

## 2016-10-21 LAB — COMPLETE METABOLIC PANEL WITH GFR
AG Ratio: 1.6 (calc) (ref 1.0–2.5)
ALT: 56 U/L — ABNORMAL HIGH (ref 9–46)
AST: 25 U/L (ref 10–40)
Albumin: 4.7 g/dL (ref 3.6–5.1)
Alkaline phosphatase (APISO): 59 U/L (ref 40–115)
BUN: 12 mg/dL (ref 7–25)
CO2: 28 mmol/L (ref 20–32)
Calcium: 9.9 mg/dL (ref 8.6–10.3)
Chloride: 102 mmol/L (ref 98–110)
Creat: 1.03 mg/dL (ref 0.60–1.35)
GFR, Est African American: 106 mL/min/{1.73_m2} (ref >=60)
GFR, Est Non African American: 92 mL/min/{1.73_m2} (ref >=60)
Globulin: 3 g/dL (calc) (ref 1.9–3.7)
Glucose, Bld: 96 mg/dL (ref 65–99)
Potassium: 4.6 mmol/L (ref 3.5–5.3)
Sodium: 140 mmol/L (ref 135–146)
Total Bilirubin: 0.3 mg/dL (ref 0.2–1.2)
Total Protein: 7.7 g/dL (ref 6.1–8.1)

## 2016-10-21 LAB — QUANTIFERON TB GOLD ASSAY (BLOOD)
Mitogen-Nil: >10 IU/mL
QUANTIFERON(R)-TB GOLD: NEGATIVE
Quantiferon Nil Value: 0.03 IU/mL
Quantiferon Tb Ag Minus Nil Value: 0.01 IU/mL

## 2016-10-26 MED FILL — COSENTYX 300 MG DOSE-2 PENS: 150 | 28 days supply | Qty: 2 | Fill #2

## 2016-11-23 ENCOUNTER — Other Ambulatory Visit: Payer: Self-pay | Admitting: Rheumatology

## 2016-11-23 NOTE — Telephone Encounter (Signed)
Last visit: 10/19/16 Next visit: 03/22/17 Labs: 10/19/16 ALTs still mildly elevated. TB Gold: 10/19/16 Neg  Ok to refill per Dr. Corliss Skainseveshwar.

## 2016-11-26 MED FILL — COSENTYX 300 MG DOSE-2 PENS: 150 | 28 days supply | Qty: 2 | Fill #0

## 2016-12-07 ENCOUNTER — Ambulatory Visit: Payer: BLUE CROSS/BLUE SHIELD | Admitting: Neurology

## 2016-12-22 ENCOUNTER — Telehealth: Payer: Self-pay

## 2016-12-22 NOTE — Telephone Encounter (Signed)
Received a message from [facility] stating that Cosentyx was on back order and pt would like to get a sample to continue therapy. Check stock and with LPN, Sue LushAndrea. We are able to provide a sample for pt.   Called patient to inform. He plans to come by the office around 3 or 4 pm.   Abran DukeHopkins, Luree Palla, CPhT 8:34 AM

## 2017-01-14 MED FILL — COSENTYX 300 MG DOSE-2 PENS: 150 | 28 days supply | Qty: 2 | Fill #1

## 2017-02-10 MED FILL — COSENTYX 300 MG DOSE-2 PENS: 150 | 28 days supply | Qty: 2 | Fill #2

## 2017-03-09 NOTE — Progress Notes (Addendum)
Office Visit Note  Patient: Jacob Massey             Date of Birth: 1980-09-12           MRN: [id number]             PCP: Merri Brunette, MD Referring: Merri Brunette, MD Visit Date: 03/22/2017 Occupation: @GUAROCC @    Subjective:  joint stiffness.   History of Present Illness: Jacob Massey is a 37 y.o. male with history of psoriatic arthritis and psoriasis.  He states that he has been having some stiffness and discomfort in his right third finger.  None of the other joints are painful.  Stiff in his hip joints especially after prolonged sitting.  He is not having any active psoriasis lesions.  He notices no side effects from Cosyntex.  Activities of Daily Living:  Patient reports morning stiffness for 5 minutes.   Patient Denies nocturnal pain.  Difficulty dressing/grooming: Denies Difficulty climbing stairs: Denies Difficulty getting out of chair: Denies Difficulty using hands for taps, buttons, cutlery, and/or writing: Denies   Review of Systems  Constitutional: Positive for fatigue. Negative for night sweats and weakness ( ).  HENT: Positive for mouth dryness. Negative for mouth sores and nose dryness.   Eyes: Negative for redness and dryness.  Respiratory: Negative for shortness of breath and difficulty breathing.   Cardiovascular: Negative for chest pain, palpitations, hypertension, irregular heartbeat and swelling in legs/feet.  Gastrointestinal: Negative for constipation and diarrhea.  Endocrine: Negative for increased urination.  Musculoskeletal: Positive for arthralgias, joint pain and morning stiffness. Negative for joint swelling, myalgias, muscle weakness, muscle tenderness and myalgias.  Skin: Positive for rash. Negative for color change, hair loss, nodules/bumps, skin tightness, ulcers and sensitivity to sunlight.  Allergic/Immunologic: Negative for susceptible to infections.  Neurological: Negative for dizziness, fainting, memory loss and night sweats.    Hematological: Negative for swollen glands.  Psychiatric/Behavioral: Positive for sleep disturbance. Negative for depressed mood. The patient is not nervous/anxious.     PMFS History:  Patient Active Problem List   Diagnosis Date Noted  . High risk medication use 06/22/2016  . History of gastroesophageal reflux (GERD) 03/12/2016  . History of hypertension 03/12/2016  . 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 Psoriasis 12/03/2015  . Psoriatic arthritis (HCC) 12/03/2015  . Bicipital tendinitis 12/03/2015  . Hypertension 12/03/2015  . GERD (gastroesophageal reflux disease) 12/03/2015  . ADHD 12/03/2015  . Genital herpes 12/03/2015  . Depression 12/03/2015  . Noncompliance 12/03/2015  . Needle phobia 12/03/2015    Past Medical History:  Diagnosis Date  . ADHD 12/03/2015  . Bicipital tendinitis 12/03/2015  . Genital herpes 12/03/2015  . GERD (gastroesophageal reflux disease) 12/03/2015  . Hypertension 12/03/2015  . Psoriasis 12/03/2015  . Psoriatic arthritis (HCC) 12/03/2015  . Sinusitis   . Tendonitis     Family History  Problem Relation Age of Onset  . Arthritis Mother   . Diabetes Mother   . Heart attack Father   . Cancer Father   . ADD / ADHD Daughter    Past Surgical History:  Procedure Laterality Date  . VASECTOMY     Social History   Social History Narrative  . Not on file     Objective: Vital Signs: BP (!) 142/90 (BP Location: Left Arm, Patient Position: Sitting, Cuff Size: Normal)   Pulse 69   Resp 17   Ht 6\' 1"  (1.854 m)   Wt 238 lb 8 oz (108.2 kg)  BMI 31.47 kg/m    Physical Exam  Constitutional: He is oriented to person, place, and time. He appears well-developed and well-nourished.  HENT:  Head: Normocephalic and atraumatic.  Eyes: Conjunctivae and EOM are normal. Pupils are equal, round, and reactive to light.  Neck: Normal range of motion. Neck supple.  Cardiovascular: Normal rate, regular rhythm  and normal heart sounds.  Pulmonary/Chest: Effort normal and breath sounds normal.  Abdominal: Soft. Bowel sounds are normal.  Neurological: He is alert and oriented to person, place, and time.  Skin: Skin is warm and dry. Capillary refill takes less than 2 seconds.  Psychiatric: He has a normal mood and affect. His behavior is normal.  Nursing note and vitals reviewed.    Musculoskeletal Exam: C-spine thoracic lumbar spine good range of motion.  No SI joint tenderness was noted.  He has some discomfort over bilateral superior iliac spine.  Shoulder joints, elbow joints, wrist joints, MCPs PIPs DIPs with good range of motion.  He has some thickening of the right third PIP joint without any synovitis.  Hip joints, knee joints, ankles and MTPs PIPs a good range of motion.  CDAI Exam: CDAI Homunculus Exam:   Tenderness:  Right hand: 3rd PIP  Joint Counts:  CDAI Tender Joint count: 1 CDAI Swollen Joint count: 0  Global Assessments:  Patient Global Assessment: 4 Provider Global Assessment: 4  CDAI Calculated Score: 9    Investigation: No additional findings.TB Gold: 10/19/2016 Negative  CBC Latest Ref Rng & Units 10/19/2016 08/17/2016 07/06/2016  WBC 3.8 - 10.8 Thousand/uL 5.1 4.7 4.1  Hemoglobin 13.2 - 17.1 g/dL 96.0 45.4 09.8  Hematocrit 38.5 - 50.0 % 46.2 45.4 45.3  Platelets 140 - 400 Thousand/uL 324 309 280   CMP Latest Ref Rng & Units 10/19/2016 08/17/2016 07/06/2016  Glucose 65 - 99 mg/dL 96 119(J) 99  BUN 7 - 25 mg/dL 12 14 10   Creatinine 0.60 - 1.35 mg/dL 4.78 2.95 6.21  Sodium 135 - 146 mmol/L 140 137 140  Potassium 3.5 - 5.3 mmol/L 4.6 4.5 4.8  Chloride 98 - 110 mmol/L 102 102 106  CO2 20 - 32 mmol/L 28 25 21   Calcium 8.6 - 10.3 mg/dL 9.9 9.4 9.6  Total Protein 6.1 - 8.1 g/dL 7.7 7.3 7.1  Total Bilirubin 0.2 - 1.2 mg/dL 0.3 0.5 0.4  Alkaline Phos 40 - 115 U/L - 55 57  AST 10 - 40 U/L 25 28 26   ALT 9 - 46 U/L 56(H) 60(H) 54(H)    Imaging: Xr Hand 2 View  Left  Result Date: 03/22/2017 PIP and DIP narrowing was noted.  No MCP intercarpal radiocarpal joint space narrowing was noted.  No erosive changes were noted.  There was no interval change from the previous x-rays of July 2014. 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  Xr Hand 2 View Right  Result Date: 03/22/2017 PIP and DIP narrowing was noted.  No MCP intercarpal radiocarpal joint space narrowing was noted.  No erosive changes were noted.  There was no interval change from the previous x-rays of July 2014. 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   Speciality Comments: No specialty comments available.    Procedures:  No procedures performed Allergies: Methotrexate derivatives   Assessment / Plan:     Visit Diagnoses: Psoriatic arthritis (HCC): He continues to have some discomfort in his bilateral hands.  He has some synovial thickening in his right third PIP joint.  I would obtain x-ray of his bilateral hands today for comparison.  I believe his arthritis is quite well controlled on Cosyntex.  Psoriasis - rash, nail pitting and nail dystrophy.  He has no active rash on examination today.  Pain in both hands - Plan: XR Hand 2 View Right, XR Hand 2 View Left.  No radiographic progression is noted in the x-rays.  High risk medication use - cosentyx 300 mg sq q month(07/13/2016)[ Humira, Enbrel-inadequate response] - Plan: CBC with Differential/Platelet, COMPLETE METABOLIC PANEL WITH GFR today and every 3 months.  DDD (degenerative disc disease), thoracic: Chronic pain.  He sees a Landchiropractor.  Other medical problems are listed as follows:  History of gastroesophageal reflux (GERD)  History of depression  History of obesity  History of hypertension  History of ADHD    Orders: Orders Placed This Encounter  Procedures  . XR Hand 2 View Right  . XR Hand 2 View Left   . CBC with Differential/Platelet  . COMPLETE METABOLIC PANEL WITH GFR   No orders of the defined types were placed in this encounter.   Face-to-face time spent with patient was 30 minutes.  Greater than 50% of time was spent in counseling and coordination of care.  Follow-Up Instructions: Return in about 5 months (around 08/19/2017) for Psoriatic arthritis.   Pollyann SavoyShaili Juni Glaab, MD  Note - This record has been created using [employer] software.  Chart creation errors have been sought, but may not always  have been located. Such creation errors do not reflect on  the standard of medical care.

## 2017-03-11 ENCOUNTER — Other Ambulatory Visit: Payer: Self-pay | Admitting: Rheumatology

## 2017-03-11 MED FILL — COSENTYX 300 MG DOSE-2 PENS: 150 | 28 days supply | Qty: 2 | Fill #0

## 2017-03-11 NOTE — Telephone Encounter (Signed)
Last visit: 10/19/2016 Next visit: 03/22/2017 Labs: 10/19/2016 ALTs still mildly elevated. Other labs are within normal limits. Tb Gold: 10/19/2016 Negative   Advised patient he is due for labs. Patient will have labs drawn at next appointment on 03/22/2017.   Okay to refill 30 day supply per Dr. Corliss Skainseveshwar.

## 2017-03-22 ENCOUNTER — Ambulatory Visit: Payer: BLUE CROSS/BLUE SHIELD | Admitting: Rheumatology

## 2017-03-22 ENCOUNTER — Ambulatory Visit (INDEPENDENT_AMBULATORY_CARE_PROVIDER_SITE_OTHER): Payer: Self-pay

## 2017-03-22 ENCOUNTER — Encounter: Payer: Self-pay | Admitting: Rheumatology

## 2017-03-22 VITALS — BP 142/90 | HR 69 | Resp 17 | Ht 73.0 in | Wt 238.5 lb

## 2017-03-22 DIAGNOSIS — M79642 Pain in left hand: Secondary | ICD-10-CM

## 2017-03-22 DIAGNOSIS — M5134 Other intervertebral disc degeneration, thoracic region: Secondary | ICD-10-CM | POA: Diagnosis not present

## 2017-03-22 DIAGNOSIS — M79641 Pain in right hand: Secondary | ICD-10-CM

## 2017-03-22 DIAGNOSIS — Z79899 Other long term (current) drug therapy: Secondary | ICD-10-CM

## 2017-03-22 DIAGNOSIS — Z8659 Personal history of other mental and behavioral disorders: Secondary | ICD-10-CM

## 2017-03-22 DIAGNOSIS — Z8679 Personal history of other diseases of the circulatory system: Secondary | ICD-10-CM | POA: Diagnosis not present

## 2017-03-22 DIAGNOSIS — Z8719 Personal history of other diseases of the digestive system: Secondary | ICD-10-CM

## 2017-03-22 DIAGNOSIS — L4050 Arthropathic psoriasis, unspecified: Secondary | ICD-10-CM | POA: Diagnosis not present

## 2017-03-22 DIAGNOSIS — Z8639 Personal history of other endocrine, nutritional and metabolic disease: Secondary | ICD-10-CM

## 2017-03-22 DIAGNOSIS — L409 Psoriasis, unspecified: Secondary | ICD-10-CM | POA: Diagnosis not present

## 2017-03-22 NOTE — Patient Instructions (Signed)
Standing Labs We placed an order today for your standing lab work.    Please come back and get your standing labs in May and  Every 3 months  We have open lab Monday through Friday from 8:30-11:30 AM and 1:30-4 PM at the office of Dr. Pollyann SavoyShaili Rylann Munford.   The office is located at [address], [address], [place], [place] [address] No appointment is necessary.   Labs are drawn by [facility].  You may receive a bill from [facility] for your lab work. If you have any questions regarding directions or hours of operation,  please call [phone].

## 2017-03-23 LAB — CBC WITH DIFFERENTIAL/PLATELET
Basophils Absolute: 59 cells/uL (ref 0–200)
Basophils Relative: 0.9 %
Eosinophils Absolute: 273 cells/uL (ref 15–500)
Eosinophils Relative: 4.2 %
HCT: 45.3 % (ref 38.5–50.0)
Hemoglobin: 16.1 g/dL (ref 13.2–17.1)
Lymphs Abs: 2210 cells/uL (ref 850–3900)
MCH: 30.6 pg (ref 27.0–33.0)
MCHC: 35.5 g/dL (ref 32.0–36.0)
MCV: 86.1 fL (ref 80.0–100.0)
MPV: 9.1 fL (ref 7.5–12.5)
Monocytes Relative: 9.9 %
Neutro Abs: 3315 cells/uL (ref 1500–7800)
Neutrophils Relative %: 51 %
Platelets: 329 10*3/uL (ref 140–400)
RBC: 5.26 10*6/uL (ref 4.20–5.80)
RDW: 13.2 % (ref 11.0–15.0)
Total Lymphocyte: 34 %
WBC mixed population: 644 cells/uL (ref 200–950)
WBC: 6.5 10*3/uL (ref 3.8–10.8)

## 2017-03-23 LAB — COMPLETE METABOLIC PANEL WITH GFR
AG Ratio: 1.5 (calc) (ref 1.0–2.5)
ALT: 34 U/L (ref 9–46)
AST: 19 U/L (ref 10–40)
Albumin: 4.8 g/dL (ref 3.6–5.1)
Alkaline phosphatase (APISO): 59 U/L (ref 40–115)
BUN: 9 mg/dL (ref 7–25)
CO2: 29 mmol/L (ref 20–32)
Calcium: 10 mg/dL (ref 8.6–10.3)
Chloride: 102 mmol/L (ref 98–110)
Creat: 1.11 mg/dL (ref 0.60–1.35)
GFR, Est African American: 97 mL/min/{1.73_m2} (ref >=60)
GFR, Est Non African American: 84 mL/min/{1.73_m2} (ref >=60)
Globulin: 3.2 g/dL (calc) (ref 1.9–3.7)
Glucose, Bld: 90 mg/dL (ref 65–99)
Potassium: 4.5 mmol/L (ref 3.5–5.3)
Sodium: 138 mmol/L (ref 135–146)
Total Bilirubin: 0.4 mg/dL (ref 0.2–1.2)
Total Protein: 8 g/dL (ref 6.1–8.1)

## 2017-04-05 ENCOUNTER — Other Ambulatory Visit: Payer: Self-pay | Admitting: Rheumatology

## 2017-04-05 NOTE — Telephone Encounter (Signed)
Last Visit: 03/22/17 Next Visit: 08/23/17 Labs: 03/22/17 WNL TB Gold: 10/19/16 Neg   Okay to refill per Dr. Corliss Skainseveshwar

## 2017-04-08 MED FILL — COSENTYX 300 MG DOSE-2 PENS: 150 | 28 days supply | Qty: 2 | Fill #0

## 2017-05-05 MED FILL — COSENTYX 300 MG DOSE-2 PENS: 150 | 28 days supply | Qty: 2 | Fill #1

## 2017-06-03 MED FILL — COSENTYX 300 MG DOSE-2 PENS: 150 | 28 days supply | Qty: 2 | Fill #2

## 2017-06-22 ENCOUNTER — Other Ambulatory Visit: Payer: Self-pay | Admitting: Rheumatology

## 2017-06-22 NOTE — Telephone Encounter (Signed)
Last visit: 03/22/2017 Next visit: 08/23/2017 Labs: 03/22/2017 WNL TB Gold: 10/19/2016 Negative  Okay to refill 30 day supply, per Dr. Corliss Skains. Advised patient he is due for labs and he will come in on 06/25/2017 to have labs drawn.

## 2017-06-25 ENCOUNTER — Other Ambulatory Visit: Payer: Self-pay | Admitting: *Deleted

## 2017-06-25 ENCOUNTER — Other Ambulatory Visit: Payer: Self-pay

## 2017-06-25 DIAGNOSIS — Z79899 Other long term (current) drug therapy: Secondary | ICD-10-CM

## 2017-06-25 LAB — CBC WITH DIFFERENTIAL/PLATELET
Basophils Absolute: 58 {cells}/uL (ref 0–200)
Basophils Relative: 0.9 %
Eosinophils Absolute: 333 {cells}/uL (ref 15–500)
Eosinophils Relative: 5.2 %
HCT: 46.3 % (ref 38.5–50.0)
Hemoglobin: 16.3 g/dL (ref 13.2–17.1)
Lymphs Abs: 2438 {cells}/uL (ref 850–3900)
MCH: 30.4 pg (ref 27.0–33.0)
MCHC: 35.2 g/dL (ref 32.0–36.0)
MCV: 86.2 fL (ref 80.0–100.0)
MPV: 9.3 fL (ref 7.5–12.5)
Monocytes Relative: 9.4 %
Neutro Abs: 2970 {cells}/uL (ref 1500–7800)
Neutrophils Relative %: 46.4 %
Platelets: 363 10*3/uL (ref 140–400)
RBC: 5.37 Million/uL (ref 4.20–5.80)
RDW: 13 % (ref 11.0–15.0)
Total Lymphocyte: 38.1 %
WBC mixed population: 602 {cells}/uL (ref 200–950)
WBC: 6.4 10*3/uL (ref 3.8–10.8)

## 2017-06-25 LAB — COMPLETE METABOLIC PANEL WITHOUT GFR
AG Ratio: 1.6 (calc) (ref 1.0–2.5)
ALT: 47 U/L — ABNORMAL HIGH (ref 9–46)
AST: 23 U/L (ref 10–40)
Albumin: 4.9 g/dL (ref 3.6–5.1)
Alkaline phosphatase (APISO): 60 U/L (ref 40–115)
BUN: 12 mg/dL (ref 7–25)
CO2: 28 mmol/L (ref 20–32)
Calcium: 10 mg/dL (ref 8.6–10.3)
Chloride: 100 mmol/L (ref 98–110)
Creat: 1.18 mg/dL (ref 0.60–1.35)
GFR, Est African American: 90 mL/min/{1.73_m2}
GFR, Est Non African American: 77 mL/min/{1.73_m2}
Globulin: 3 g/dL (ref 1.9–3.7)
Glucose, Bld: 82 mg/dL (ref 65–99)
Potassium: 5 mmol/L (ref 3.5–5.3)
Sodium: 136 mmol/L (ref 135–146)
Total Bilirubin: 0.6 mg/dL (ref 0.2–1.2)
Total Protein: 7.9 g/dL (ref 6.1–8.1)

## 2017-06-29 NOTE — Progress Notes (Signed)
LFTs are mildly elevated.  Please advise patient to avoid alcohol and NSAIDs.

## 2017-06-30 MED FILL — COSENTYX 300 MG DOSE-2 PENS: 150 | 28 days supply | Qty: 2 | Fill #0

## 2017-07-22 ENCOUNTER — Other Ambulatory Visit: Payer: Self-pay | Admitting: Rheumatology

## 2017-07-22 NOTE — Telephone Encounter (Signed)
Last visit: 03/22/2017 Next visit: 08/23/2017 Labs: 06/25/17 LFTs are mildly elevated TB Gold: 10/19/2016 Negative  Okay to refill per Dr. Corliss Skainseveshwar

## 2017-07-29 MED FILL — COSENTYX 300 MG DOSE-2 PENS: 150 | 28 days supply | Qty: 2 | Fill #0

## 2017-07-30 NOTE — Progress Notes (Signed)
Office Visit Note  Patient: Jacob Massey             Date of Birth: [birth year]           MRN: [id number]             PCP: Merri Brunette, MD Referring: Merri Brunette, MD Visit Date: 08/13/2017 Occupation: @GUAROCC @    Subjective:  Bilateral hand pain and swelling   History of Present Illness: Mohsen Odenthal is a 37 y.o. male with history of psoriatic arthritis and DDD.  Patient is on Cosentyx 300 mg of cutaneous injections every month.  He reports his last injection of Cosentyx was advised second 2018.  He reports he has been tolerating the medication well.  He states he continues to have bilateral hand pain and swelling on a daily basis.  He denies any pain in his his feet or knee joints.  He continues to have lower back pain and stiffness.   He states that occasionally he experiences radiation of pain and numbness down the right leg.  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  He denies any active psoriasis at this time.  He denies any Achilles tendinitis or plantar fasciitis.  He denies taking any over-the-counter products for pain relief.   Activities of Daily Living:  Patient reports morning stiffness all day.   Patient Denies nocturnal pain.  Difficulty dressing/grooming: Denies Difficulty climbing stairs: Reports Difficulty getting out of chair: Reports Difficulty using hands for taps, buttons, cutlery, and/or writing: Reports   Review of Systems  Constitutional: Positive for fatigue. Negative for night sweats.  HENT: Positive for mouth dryness. Negative for mouth sores, trouble swallowing, trouble swallowing and nose dryness.   Eyes: Negative for redness, visual disturbance and dryness.  Respiratory: Negative for cough, hemoptysis, shortness of breath and difficulty breathing.   Cardiovascular: Negative for chest pain, palpitations, hypertension, irregular heartbeat and swelling in legs/feet.  Gastrointestinal: Negative for abdominal pain, blood in  stool, constipation and diarrhea.  Endocrine: Negative for increased urination.  Genitourinary: Negative for painful urination and pelvic pain.  Musculoskeletal: Positive for arthralgias, joint pain, joint swelling and morning stiffness. Negative for myalgias, muscle weakness, muscle tenderness and myalgias.  Skin: Positive for rash. Negative for color change, hair loss, nodules/bumps, skin tightness, ulcers and sensitivity to sunlight.  Allergic/Immunologic: Negative for susceptible to infections.  Neurological: Negative for dizziness, fainting, headaches and night sweats.  Hematological: Negative for bruising/bleeding tendency and swollen glands.  Psychiatric/Behavioral: Negative for depressed mood and sleep disturbance. The patient is not nervous/anxious.     PMFS History:  Patient Active Problem List   Diagnosis Date Noted  . High risk medication use 06/22/2016  . History of gastroesophageal reflux (GERD) 03/12/2016  . History of hypertension 03/12/2016  . 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 Psoriasis 12/03/2015  . Psoriatic arthritis (HCC) 12/03/2015  . Bicipital tendinitis 12/03/2015  . Hypertension 12/03/2015  . GERD (gastroesophageal reflux disease) 12/03/2015  . ADHD 12/03/2015  . Genital herpes 12/03/2015  . Depression 12/03/2015  . Noncompliance 12/03/2015  . Needle phobia 12/03/2015    Past Medical History:  Diagnosis Date  . ADHD 12/03/2015  . Bicipital tendinitis 12/03/2015  . Genital herpes 12/03/2015  . GERD (gastroesophageal reflux disease) 12/03/2015  . Hypertension 12/03/2015  . Psoriasis 12/03/2015  . Psoriatic arthritis (HCC) 12/03/2015  . Sinusitis   . Tendonitis     Family History  Problem Relation Age of Onset  .  Arthritis Mother   . Diabetes Mother        borderline   . Heart attack Father   . Cancer Father   . ADD / ADHD Daughter    Past Surgical History:  Procedure Laterality Date  . VASECTOMY       Social History   Social History Narrative  . Not on file     Objective: Vital Signs: BP (!) 143/91 (BP Location: Left Arm, Patient Position: Sitting, Cuff Size: Normal)   Pulse 73   Resp 16   Ht 6\' 1"  (1.854 m)   Wt 247 lb (112 kg)   BMI 32.59 kg/m    Physical Exam  Constitutional: He is oriented to person, place, and time. He appears well-developed and well-nourished.  HENT:  Head: Normocephalic and atraumatic.  Eyes: Pupils are equal, round, and reactive to light. Conjunctivae and EOM are normal.  Neck: Normal range of motion. Neck supple.  Cardiovascular: Normal rate, regular rhythm and normal heart sounds.  Pulmonary/Chest: Effort normal and breath sounds normal.  Abdominal: Soft. Bowel sounds are normal.  Lymphadenopathy:    He has no cervical adenopathy.  Neurological: He is alert and oriented to person, place, and time.  Skin: Skin is warm and dry. Capillary refill takes less than 2 seconds.  Psychiatric: He has a normal mood and affect. His behavior is normal.  Nursing note and vitals reviewed.    Musculoskeletal Exam: C-spine, thoracic spine, lumbar spine good range of motion.  No midline spinal tenderness.  No SI joint tenderness on exam.  Shoulder joints, elbow joints, wrist joints, MCPs, PIPs, DIPs good range of motion with no synovitis.  Is tenderness of bilateral third PIP joints.  Synovial thickening of right third PIP joint.  Hip joints, knee joints, ankle joints, MTPs, PIPs, DIPs good range of motion with no synovitis.  No warmth or effusion of bilateral knee joints.  Tenderness of bilateral trochanteric bursa.  CDAI Exam: CDAI Homunculus Exam:   Tenderness:  Right hand: 3rd PIP Left hand: 3rd PIP  Joint Counts:  CDAI Tender Joint count: 2 CDAI Swollen Joint count: 0  Global Assessments:  Patient Global Assessment: 4 Provider Global Assessment: 4  CDAI Calculated Score: 10    Investigation: No additional findings. CBC    Component Value  Date/Time   WBC 6.4 06/25/2017 1028   RBC 5.37 06/25/2017 1028   HGB 16.3 06/25/2017 1028   HCT 46.3 06/25/2017 1028   PLT 363 06/25/2017 1028   MCV 86.2 06/25/2017 1028   MCH 30.4 06/25/2017 1028   MCHC 35.2 06/25/2017 1028   RDW 13.0 06/25/2017 1028   LYMPHSABS 2,438 06/25/2017 1028   MONOABS 470 08/17/2016 0850   EOSABS 333 06/25/2017 1028   BASOSABS 58 06/25/2017 1028   CMP Latest Ref Rng & Units 06/25/2017 03/22/2017 10/19/2016  Glucose 65 - 99 mg/dL 82 90 96  BUN 7 - 25 mg/dL 12 9 12   Creatinine 0.60 - 1.35 mg/dL 0.101.18 2.721.11 5.361.03  Sodium 135 - 146 mmol/L 136 138 140  Potassium 3.5 - 5.3 mmol/L 5.0 4.5 4.6  Chloride 98 - 110 mmol/L 100 102 102  CO2 20 - 32 mmol/L 28 29 28   Calcium 8.6 - 10.3 mg/dL 64.410.0 03.410.0 9.9  Total Protein 6.1 - 8.1 g/dL 7.9 8.0 7.7  Total Bilirubin 0.2 - 1.2 mg/dL 0.6 0.4 0.3  Alkaline Phos 40 - 115 U/L - - -  AST 10 - 40 U/L 23 19 25   ALT 9 -  46 U/L 47(H) 34 56(H)     Imaging: No results found.  Speciality Comments: No specialty comments available.    Procedures:  No procedures performed Allergies: Methotrexate derivatives   Assessment / Plan:     Visit Diagnoses: Psoriatic arthritis (HCC) -he has no synovitis or dactylitis on exam.  He has not had any recent flares.  He continues to have chronic hand pain and stiffness.  He has tenderness of bilateral third PIP joints but no synovitis was noted.  No Achilles tendinitis or plantar fasciitis.  No tenderness of bilateral SI joints on exam today.  He clinically is doing well on Cosentyx 300 mg subcutaneous injections once monthly.  His last injection was on 08/03/2017.  He feels as though overall the injections are lasting him a full month without any increased breakthrough pain or swelling he continues to have chronic stiffness in bilateral hands and lumbar spine.  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  We will check a sed rate with  his next labs in August.  We discussed taking NSAIDs over-the-counter but he declined at this time.   Association of heart disease with psoriatic arthritis was discussed. Need to monitor blood pressure, cholesterol, and to exercise 30-60 minutes on daily basis was discussed.  He is going to follow-up with his PCP to have a lipid panel checked.  Plan: Sedimentation rate  Psoriasis: He has no active psoriasis at this time.  High risk medication use - Cosentyx 300 mg subcutaneous injection every month inadequate response to Humira and Enbrel -CBC and CMP were drawn on 06/25/2017.  He will be due for lab work in August and every 3 months.  Standing orders are in place.  Future order for TB gold and sed rate were placed today.  Plan: QuantiFERON-TB Gold Plus  DDD (degenerative disc disease), thoracic: No midline spinal tenderness.  No thoracic kyphosis noted.  He has chronic back stiffness.   Spondylosis of lumbar region without myelopathy or radiculopathy: No midline spinal tenderness.  Good range of motion.  He has chronic back pain and stiffness.   Other medical conditions are listed as follows:   Needle phobia  History of depression  History of obesity  History of hypertension  History of ADHD    Orders: Orders Placed This Encounter  Procedures  . QuantiFERON-TB Gold Plus  . Sedimentation rate   No orders of the defined types were placed in this encounter.    Follow-Up Instructions: Return in about 5 months (around 01/13/2018) for Psoriatic arthritis, DDD.   Gearldine Bienenstock, PA-C  Note - This record has been created using Dragon software.  Chart creation errors have been sought, but may not always  have been located. Such creation errors do not reflect on  the standard of medical care.

## 2017-08-13 ENCOUNTER — Ambulatory Visit: Payer: BLUE CROSS/BLUE SHIELD | Admitting: Physician Assistant

## 2017-08-13 ENCOUNTER — Encounter: Payer: Self-pay | Admitting: Physician Assistant

## 2017-08-13 VITALS — BP 143/91 | HR 73 | Resp 16 | Ht 73.0 in | Wt 247.0 lb

## 2017-08-13 DIAGNOSIS — Z8659 Personal history of other mental and behavioral disorders: Secondary | ICD-10-CM | POA: Diagnosis not present

## 2017-08-13 DIAGNOSIS — M5134 Other intervertebral disc degeneration, thoracic region: Secondary | ICD-10-CM

## 2017-08-13 DIAGNOSIS — F40298 Other specified phobia: Secondary | ICD-10-CM

## 2017-08-13 DIAGNOSIS — M47816 Spondylosis without myelopathy or radiculopathy, lumbar region: Secondary | ICD-10-CM

## 2017-08-13 DIAGNOSIS — Z79899 Other long term (current) drug therapy: Secondary | ICD-10-CM

## 2017-08-13 DIAGNOSIS — Z8679 Personal history of other diseases of the circulatory system: Secondary | ICD-10-CM | POA: Diagnosis not present

## 2017-08-13 DIAGNOSIS — Z8639 Personal history of other endocrine, nutritional and metabolic disease: Secondary | ICD-10-CM

## 2017-08-13 DIAGNOSIS — L409 Psoriasis, unspecified: Secondary | ICD-10-CM | POA: Diagnosis not present

## 2017-08-13 DIAGNOSIS — L4050 Arthropathic psoriasis, unspecified: Secondary | ICD-10-CM

## 2017-08-13 NOTE — Patient Instructions (Addendum)
Standing Labs We placed an order today for your standing lab work.    Please come back and get your standing labs in August and every 3 months   CBC, CMP, and TB gold   We have open lab Monday through Friday from 8:30-11:30 AM and 1:30-4:00 PM  at the office of Dr. Pollyann SavoyShaili Deveshwar.   You may experience shorter wait times on Monday and Friday afternoons. The office is located at [address], [address], [place], [place] [address] No appointment is necessary.   Labs are drawn by [facility].  You may receive a bill from [facility] for your lab work. If you have any questions regarding directions or hours of operation,  please call [phone].     Natural anti-inflammatories  You can purchase these at [employer], [employer] or online.  . Turmeric (capsules)  . Ginger (ginger root or capsules)  . Omega 3 (Fish, flax seeds, chia seeds, walnuts, almonds)  . Tart cherry (dried or extract)   Patient should be under the care of a physician while taking these supplements. This may not be reproduced without the permission of Dr. Pollyann SavoyShaili Deveshwar.

## 2017-08-23 ENCOUNTER — Ambulatory Visit: Payer: BLUE CROSS/BLUE SHIELD | Admitting: Rheumatology

## 2017-08-25 MED FILL — COSENTYX 300 MG DOSE-2 PENS: 150 | 28 days supply | Qty: 2 | Fill #1

## 2017-09-17 ENCOUNTER — Other Ambulatory Visit: Payer: Self-pay

## 2017-09-17 DIAGNOSIS — Z79899 Other long term (current) drug therapy: Secondary | ICD-10-CM

## 2017-09-17 DIAGNOSIS — L4050 Arthropathic psoriasis, unspecified: Secondary | ICD-10-CM

## 2017-09-19 LAB — SEDIMENTATION RATE: Sed Rate: 2 mm/h (ref 0–15)

## 2017-09-19 LAB — QUANTIFERON-TB GOLD PLUS
Mitogen-NIL: >10 IU/mL
NIL: 0.05 IU/mL
QuantiFERON-TB Gold Plus: NEGATIVE
TB1-NIL: 0 IU/mL
TB2-NIL: 0 IU/mL

## 2017-09-20 NOTE — Progress Notes (Signed)
Sed rate WNL. TB gold negative.

## 2017-09-22 MED FILL — COSENTYX 300 MG DOSE-2 PENS: 150 | 28 days supply | Qty: 2 | Fill #2

## 2017-10-13 ENCOUNTER — Other Ambulatory Visit: Payer: Self-pay | Admitting: Rheumatology

## 2017-10-13 DIAGNOSIS — Z79899 Other long term (current) drug therapy: Secondary | ICD-10-CM

## 2017-10-13 NOTE — Telephone Encounter (Signed)
Last Visit: 08/13/17 Next Visit: 02/11/18 Labs: 06/25/17 LFTs are mildly elevated TB Gold: 09/17/17   Patient advised he is due for labs and will come 10/15/17.  Okay to refill 30 day supply per Dr. Corliss Skains

## 2017-10-15 ENCOUNTER — Other Ambulatory Visit: Payer: Self-pay

## 2017-10-15 DIAGNOSIS — Z79899 Other long term (current) drug therapy: Secondary | ICD-10-CM

## 2017-10-16 LAB — CBC WITH DIFFERENTIAL/PLATELET
Basophils Absolute: 48 cells/uL (ref 0–200)
Basophils Relative: 1 %
Eosinophils Absolute: 211 cells/uL (ref 15–500)
Eosinophils Relative: 4.4 %
HCT: 42.2 % (ref 38.5–50.0)
Hemoglobin: 14.6 g/dL (ref 13.2–17.1)
Lymphs Abs: 1930 cells/uL (ref 850–3900)
MCH: 30.4 pg (ref 27.0–33.0)
MCHC: 34.6 g/dL (ref 32.0–36.0)
MCV: 87.9 fL (ref 80.0–100.0)
MPV: 9.9 fL (ref 7.5–12.5)
Monocytes Relative: 11.3 %
Neutro Abs: 2069 cells/uL (ref 1500–7800)
Neutrophils Relative %: 43.1 %
Platelets: 295 10*3/uL (ref 140–400)
RBC: 4.8 10*6/uL (ref 4.20–5.80)
RDW: 13 % (ref 11.0–15.0)
Total Lymphocyte: 40.2 %
WBC mixed population: 542 cells/uL (ref 200–950)
WBC: 4.8 10*3/uL (ref 3.8–10.8)

## 2017-10-16 LAB — COMPLETE METABOLIC PANEL WITH GFR
AG Ratio: 1.6 (calc) (ref 1.0–2.5)
ALT: 29 U/L (ref 9–46)
AST: 16 U/L (ref 10–40)
Albumin: 4.4 g/dL (ref 3.6–5.1)
Alkaline phosphatase (APISO): 48 U/L (ref 40–115)
BUN: 13 mg/dL (ref 7–25)
CO2: 29 mmol/L (ref 20–32)
Calcium: 9.9 mg/dL (ref 8.6–10.3)
Chloride: 104 mmol/L (ref 98–110)
Creat: 1.3 mg/dL (ref 0.60–1.35)
GFR, Est African American: 80 mL/min/{1.73_m2} (ref >=60)
GFR, Est Non African American: 69 mL/min/{1.73_m2} (ref >=60)
Globulin: 2.8 g/dL (calc) (ref 1.9–3.7)
Glucose, Bld: 101 mg/dL — ABNORMAL HIGH (ref 65–99)
Potassium: 5 mmol/L (ref 3.5–5.3)
Sodium: 140 mmol/L (ref 135–146)
Total Bilirubin: 0.4 mg/dL (ref 0.2–1.2)
Total Protein: 7.2 g/dL (ref 6.1–8.1)

## 2017-10-25 MED FILL — COSENTYX 300 MG DOSE-2 PENS: 150 | 28 days supply | Qty: 2 | Fill #0

## 2017-11-16 ENCOUNTER — Other Ambulatory Visit: Payer: Self-pay | Admitting: Rheumatology

## 2017-11-16 NOTE — Telephone Encounter (Signed)
Last Visit: 08/13/17 Next Visit: 02/11/18 Labs: 10/15/17 WNL Tb Gold: 09/17/17 Neg   Okay to refill per Dr. Corliss Skains

## 2017-11-22 MED FILL — COSENTYX 300 MG DOSE-2 PENS: 150 | 28 days supply | Qty: 2 | Fill #0

## 2017-12-21 MED FILL — COSENTYX 300 MG DOSE-2 PENS: 150 | 28 days supply | Qty: 2 | Fill #1

## 2018-01-17 MED FILL — COSENTYX 300 MG DOSE-2 PENS: 150 | 28 days supply | Qty: 2 | Fill #2

## 2018-01-28 NOTE — Progress Notes (Deleted)
Office Visit Note  Patient: Jacob Massey             Date of Birth: 03/15/1980           MRN: [id number]             PCP: Merri BrunettePharr, Walter, MD Referring: Merri BrunettePharr, Walter, MD Visit Date: 02/11/2018 Occupation: @GUAROCC @  Subjective:  No chief complaint on file.   History of Present Illness: Jacob Massey is a 37 y.o. male ***   Activities of Daily Living:  Patient reports morning stiffness for *** {minute/hour:19697}.   Patient {ACTIONS;DENIES/REPORTS:21021675::"Denies"} nocturnal pain.  Difficulty dressing/grooming: {ACTIONS;DENIES/REPORTS:21021675::"Denies"} Difficulty climbing stairs: {ACTIONS;DENIES/REPORTS:21021675::"Denies"} Difficulty getting out of chair: {ACTIONS;DENIES/REPORTS:21021675::"Denies"} Difficulty using hands for taps, buttons, cutlery, and/or writing: {ACTIONS;DENIES/REPORTS:21021675::"Denies"}  No Rheumatology ROS completed.   PMFS History:  Patient Active Problem List   Diagnosis Date Noted  . High risk medication use 06/22/2016  . History of gastroesophageal reflux (GERD) 03/12/2016  . History of hypertension 03/12/2016  . 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 Psoriasis 12/03/2015  . Psoriatic arthritis (HCC) 12/03/2015  . Bicipital tendinitis 12/03/2015  . Hypertension 12/03/2015  . GERD (gastroesophageal reflux disease) 12/03/2015  . ADHD 12/03/2015  . Genital herpes 12/03/2015  . Depression 12/03/2015  . Noncompliance 12/03/2015  . Needle phobia 12/03/2015    Past Medical History:  Diagnosis Date  . ADHD 12/03/2015  . Bicipital tendinitis 12/03/2015  . Genital herpes 12/03/2015  . GERD (gastroesophageal reflux disease) 12/03/2015  . Hypertension 12/03/2015  . Psoriasis 12/03/2015  . Psoriatic arthritis (HCC) 12/03/2015  . Sinusitis   . Tendonitis     Family History  Problem Relation Age of Onset  . Arthritis Mother   . Diabetes Mother        borderline   . Heart attack Father   . Cancer  Father   . ADD / ADHD Daughter    Past Surgical History:  Procedure Laterality Date  . VASECTOMY     Social History   Social History Narrative  . Not on file    Objective: Vital Signs: There were no vitals taken for this visit.   Physical Exam   Musculoskeletal Exam: ***  CDAI Exam: CDAI Score: Not documented Patient Global Assessment: Not documented; Provider Global Assessment: Not documented Swollen: Not documented; Tender: Not documented Joint Exam   Not documented   There is currently no information documented on the homunculus. Go to the Rheumatology activity and complete the homunculus joint exam.  Investigation: No additional findings.  Imaging: No results found.  Recent Labs: Lab Results  Component Value Date   WBC 4.8 10/15/2017   HGB 14.6 10/15/2017   PLT 295 10/15/2017   NA 140 10/15/2017   K 5.0 10/15/2017   CL 104 10/15/2017   CO2 29 10/15/2017   GLUCOSE 101 (H) 10/15/2017   BUN 13 10/15/2017   CREATININE 1.30 10/15/2017   BILITOT 0.4 10/15/2017   ALKPHOS 55 08/17/2016   AST 16 10/15/2017   ALT 29 10/15/2017   PROT 7.2 10/15/2017   ALBUMIN 4.4 08/17/2016   CALCIUM 9.9 10/15/2017   GFRAA 80 10/15/2017   QFTBGOLD NEGATIVE 10/19/2016   QFTBGOLDPLUS NEGATIVE 09/17/2017    Speciality Comments: No specialty comments available.  Procedures:  No procedures performed Allergies: Methotrexate derivatives   Assessment / Plan:     Visit Diagnoses: Psoriatic arthritis (HCC)  Psoriasis  High risk medication use -  Cosentyx 300 mg subcutaneous injection every month inadequate response to Humira  and Enbrel   DDD (degenerative disc disease), thoracic  Spondylosis of lumbar region without myelopathy or radiculopathy  Needle phobia  History of depression  History of hypertension  History of ADHD  History of gastroesophageal reflux (GERD)   Orders: No orders of the defined types were placed in this encounter.  No orders of the defined  types were placed in this encounter.   Face-to-face time spent with patient was *** minutes. Greater than 50% of time was spent in counseling and coordination of care.  Follow-Up Instructions: No follow-ups on file.   Gearldine Bienenstockaylor M Ariq Khamis, PA-C  Note - This record has been created using Dragon software.  Chart creation errors have been sought, but may not always  have been located. Such creation errors do not reflect on  the standard of medical care.

## 2018-02-08 ENCOUNTER — Other Ambulatory Visit: Payer: Self-pay | Admitting: Rheumatology

## 2018-02-08 NOTE — Telephone Encounter (Signed)
Last Visit: 08/13/17 Next Visit: 03/07/18 Labs: 10/15/17 WNL Tb Gold: 09/17/17 Neg   Patient advised he is due to update labs. Patient will update labs 02/14/18.   Okay to refill 30 day supply per Dr. Corliss Skains

## 2018-02-11 ENCOUNTER — Ambulatory Visit: Payer: BLUE CROSS/BLUE SHIELD | Admitting: Physician Assistant

## 2018-02-14 ENCOUNTER — Other Ambulatory Visit: Payer: Self-pay

## 2018-02-14 DIAGNOSIS — Z79899 Other long term (current) drug therapy: Secondary | ICD-10-CM

## 2018-02-15 LAB — COMPLETE METABOLIC PANEL WITH GFR
AG Ratio: 1.6 (calc) (ref 1.0–2.5)
ALT: 24 U/L (ref 9–46)
AST: 17 U/L (ref 10–40)
Albumin: 4.5 g/dL (ref 3.6–5.1)
Alkaline phosphatase (APISO): 58 U/L (ref 40–115)
BUN: 14 mg/dL (ref 7–25)
CO2: 26 mmol/L (ref 20–32)
Calcium: 9.8 mg/dL (ref 8.6–10.3)
Chloride: 103 mmol/L (ref 98–110)
Creat: 1.3 mg/dL (ref 0.60–1.35)
GFR, Est African American: 80 mL/min/{1.73_m2} (ref >=60)
GFR, Est Non African American: 69 mL/min/{1.73_m2} (ref >=60)
Globulin: 2.9 g/dL (calc) (ref 1.9–3.7)
Glucose, Bld: 94 mg/dL (ref 65–99)
Potassium: 4.5 mmol/L (ref 3.5–5.3)
Sodium: 138 mmol/L (ref 135–146)
Total Bilirubin: 0.4 mg/dL (ref 0.2–1.2)
Total Protein: 7.4 g/dL (ref 6.1–8.1)

## 2018-02-15 LAB — CBC WITH DIFFERENTIAL/PLATELET
Absolute Monocytes: 433 cells/uL (ref 200–950)
Basophils Absolute: 59 cells/uL (ref 0–200)
Basophils Relative: 1.4 %
Eosinophils Absolute: 231 cells/uL (ref 15–500)
Eosinophils Relative: 5.5 %
HCT: 45.4 % (ref 38.5–50.0)
Hemoglobin: 15.7 g/dL (ref 13.2–17.1)
Lymphs Abs: 1352 cells/uL (ref 850–3900)
MCH: 30.1 pg (ref 27.0–33.0)
MCHC: 34.6 g/dL (ref 32.0–36.0)
MCV: 87 fL (ref 80.0–100.0)
MPV: 9.8 fL (ref 7.5–12.5)
Monocytes Relative: 10.3 %
Neutro Abs: 2125 cells/uL (ref 1500–7800)
Neutrophils Relative %: 50.6 %
Platelets: 297 10*3/uL (ref 140–400)
RBC: 5.22 10*6/uL (ref 4.20–5.80)
RDW: 13.3 % (ref 11.0–15.0)
Total Lymphocyte: 32.2 %
WBC: 4.2 10*3/uL (ref 3.8–10.8)

## 2018-02-16 MED FILL — COSENTYX 300 MG DOSE-2 PENS: 150 | 28 days supply | Qty: 2 | Fill #0

## 2018-02-21 NOTE — Progress Notes (Deleted)
Office Visit Note  Patient: Jacob Massey             Date of Birth: [birth year]           MRN: [id number]             PCP: Merri Brunette, MD Referring: Merri Brunette, MD Visit Date: 03/07/2018 Occupation: @GUAROCC @  Subjective:  No chief complaint on file.   History of Present Illness: Jacob Massey is a 38 y.o. male ***   Activities of Daily Living:  Patient reports morning stiffness for *** {minute/hour:19697}.   Patient {ACTIONS;DENIES/REPORTS:21021675::"Denies"} nocturnal pain.  Difficulty dressing/grooming: {ACTIONS;DENIES/REPORTS:21021675::"Denies"} Difficulty climbing stairs: {ACTIONS;DENIES/REPORTS:21021675::"Denies"} Difficulty getting out of chair: {ACTIONS;DENIES/REPORTS:21021675::"Denies"} Difficulty using hands for taps, buttons, cutlery, and/or writing: {ACTIONS;DENIES/REPORTS:21021675::"Denies"}  No Rheumatology ROS completed.   PMFS History:  Patient Active Problem List   Diagnosis Date Noted  . High risk medication use 06/22/2016  . History of gastroesophageal reflux (GERD) 03/12/2016  . History of hypertension 03/12/2016  . 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 Psoriasis 12/03/2015  . Psoriatic arthritis (HCC) 12/03/2015  . Bicipital tendinitis 12/03/2015  . Hypertension 12/03/2015  . GERD (gastroesophageal reflux disease) 12/03/2015  . ADHD 12/03/2015  . Genital herpes 12/03/2015  . Depression 12/03/2015  . Noncompliance 12/03/2015  . Needle phobia 12/03/2015    Past Medical History:  Diagnosis Date  . ADHD 12/03/2015  . Bicipital tendinitis 12/03/2015  . Genital herpes 12/03/2015  . GERD (gastroesophageal reflux disease) 12/03/2015  . Hypertension 12/03/2015  . Psoriasis 12/03/2015  . Psoriatic arthritis (HCC) 12/03/2015  . Sinusitis   . Tendonitis     Family History  Problem Relation Age of Onset  . Arthritis Mother   . Diabetes Mother        borderline   . Heart attack Father   . Cancer  Father   . ADD / ADHD Daughter    Past Surgical History:  Procedure Laterality Date  . VASECTOMY     Social History   Social History Narrative  . Not on file    There is no immunization history on file for this patient.   Objective: Vital Signs: There were no vitals taken for this visit.   Physical Exam   Musculoskeletal Exam: ***  CDAI Exam: CDAI Score: Not documented Patient Global Assessment: Not documented; Provider Global Assessment: Not documented Swollen: Not documented; Tender: Not documented Joint Exam   Not documented   There is currently no information documented on the homunculus. Go to the Rheumatology activity and complete the homunculus joint exam.  Investigation: No additional findings.  Imaging: No results found.  Recent Labs: Lab Results  Component Value Date   WBC 4.2 02/14/2018   HGB 15.7 02/14/2018   PLT 297 02/14/2018   NA 138 02/14/2018   K 4.5 02/14/2018   CL 103 02/14/2018   CO2 26 02/14/2018   GLUCOSE 94 02/14/2018   BUN 14 02/14/2018   CREATININE 1.30 02/14/2018   BILITOT 0.4 02/14/2018   ALKPHOS 55 08/17/2016   AST 17 02/14/2018   ALT 24 02/14/2018   PROT 7.4 02/14/2018   ALBUMIN 4.4 08/17/2016   CALCIUM 9.8 02/14/2018   GFRAA 80 02/14/2018   QFTBGOLD NEGATIVE 10/19/2016   QFTBGOLDPLUS NEGATIVE 09/17/2017    Speciality Comments: No specialty comments available.  Procedures:  No procedures performed Allergies: Methotrexate derivatives   Assessment / Plan:     Visit Diagnoses: No diagnosis found.   Orders: No orders of the defined types  were placed in this encounter.  No orders of the defined types were placed in this encounter.   Face-to-face time spent with patient was *** minutes. Greater than 50% of time was spent in counseling and coordination of care.  Follow-Up Instructions: No follow-ups on file.   Ellen HenriMarissa C Radiance Deady, CMA  Note - This record has been created using Animal nutritionistDragon software.  Chart creation  errors have been sought, but may not always  have been located. Such creation errors do not reflect on  the standard of medical care.

## 2018-03-07 ENCOUNTER — Ambulatory Visit: Payer: BLUE CROSS/BLUE SHIELD | Admitting: Physician Assistant

## 2018-03-10 ENCOUNTER — Other Ambulatory Visit: Payer: Self-pay | Admitting: Rheumatology

## 2018-03-10 NOTE — Telephone Encounter (Signed)
Last Visit: 08/13/17 Next Visit: 03/07/18 Labs:02/14/18 WNL Tb Gold: 09/17/17 Neg   Okay to refill per Dr.  Corliss Skains

## 2018-03-15 MED FILL — COSENTYX 300 MG DOSE-2 PENS: 150 | 28 days supply | Qty: 2 | Fill #0

## 2018-03-15 NOTE — Progress Notes (Signed)
Office Visit Note  Patient: Jacob Massey             Date of Birth: May 22, 1980           MRN: [id number]             PCP: Merri Brunette, MD Referring: Merri Brunette, MD Visit Date: 03/29/2018 Occupation: @GUAROCC @  Subjective:  Pain in both hands.   History of Present Illness: Jacob Massey is a 38 y.o. male with history of psoriatic arthritis and psoriasis.  He has been on Cosentyx on monthly basis.  He states he is continued to have pain and discomfort in his bilateral hands.  He denies any joint swelling.  At the lower back pain flares off and on.  He states he is a stiff after prolonged sitting.  He denies any active psoriasis rash.  Activities of Daily Living:  Patient reports morning stiffness for several hours.   Patient Denies nocturnal pain.  Difficulty dressing/grooming: Denies Difficulty climbing stairs: Reports Difficulty getting out of chair: Denies Difficulty using hands for taps, buttons, cutlery, and/or writing: Denies  Review of Systems  Constitutional: Positive for weight loss. Negative for fatigue and night sweats.       Intentional  HENT: Negative for mouth sores, mouth dryness and nose dryness.   Eyes: Negative for redness, itching and dryness.  Respiratory: Negative for shortness of breath, wheezing and difficulty breathing.   Cardiovascular: Negative for chest pain, palpitations, hypertension, irregular heartbeat and swelling in legs/feet.  Gastrointestinal: Negative for constipation and diarrhea.  Endocrine: Negative for increased urination.  Genitourinary: Negative for painful urination.  Musculoskeletal: Positive for arthralgias, joint pain, joint swelling and morning stiffness. Negative for myalgias, muscle weakness, muscle tenderness and myalgias.  Skin: Negative for color change, rash, hair loss, nodules/bumps, skin tightness, ulcers and sensitivity to sunlight.  Allergic/Immunologic: Negative for susceptible to infections.  Neurological:  Positive for dizziness and weakness. Negative for fainting, headaches, memory loss and night sweats.  Hematological: Negative for bruising/bleeding tendency and swollen glands.  Psychiatric/Behavioral: Negative for depressed mood, confusion and sleep disturbance. The patient is not nervous/anxious.     PMFS History:  Patient Active Problem List   Diagnosis Date Noted  . High risk medication use 06/22/2016  . History of gastroesophageal reflux (GERD) 03/12/2016  . History of hypertension 03/12/2016  . 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 Psoriasis 12/03/2015  . Psoriatic arthritis (HCC) 12/03/2015  . Bicipital tendinitis 12/03/2015  . Hypertension 12/03/2015  . GERD (gastroesophageal reflux disease) 12/03/2015  . ADHD 12/03/2015  . Genital herpes 12/03/2015  . Depression 12/03/2015  . Noncompliance 12/03/2015  . Needle phobia 12/03/2015    Past Medical History:  Diagnosis Date  . ADHD 12/03/2015  . Bicipital tendinitis 12/03/2015  . Genital herpes 12/03/2015  . GERD (gastroesophageal reflux disease) 12/03/2015  . Hypertension 12/03/2015  . Psoriasis 12/03/2015  . Psoriatic arthritis (HCC) 12/03/2015  . Sinusitis   . Tendonitis     Family History  Problem Relation Age of Onset  . Arthritis Mother   . Diabetes Mother        borderline   . Heart attack Father   . Cancer Father   . ADD / ADHD Daughter    Past Surgical History:  Procedure Laterality Date  . VASECTOMY     Social History   Social History Narrative  . Not on file    There is no immunization history on file for this patient.  Objective: Vital Signs: BP 134/89 (BP Location: Left Arm, Patient Position: Sitting, Cuff Size: Normal)   Pulse 84   Resp 14   Ht 6\' 1"  (1.854 m)   Wt 214 lb 8 oz (97.3 kg)   BMI 28.30 kg/m    Physical Exam Vitals signs and nursing note reviewed.  Constitutional:      Appearance: He is well-developed.  HENT:     Head:  Normocephalic and atraumatic.  Eyes:     Conjunctiva/sclera: Conjunctivae normal.     Pupils: Pupils are equal, round, and reactive to light.  Neck:     Musculoskeletal: Normal range of motion and neck supple.  Cardiovascular:     Rate and Rhythm: Normal rate and regular rhythm.     Heart sounds: Normal heart sounds.  Pulmonary:     Effort: Pulmonary effort is normal.     Breath sounds: Normal breath sounds.  Abdominal:     General: Bowel sounds are normal.     Palpations: Abdomen is soft.  Skin:    General: Skin is warm and dry.     Capillary Refill: Capillary refill takes less than 2 seconds.  Neurological:     Mental Status: He is alert and oriented to person, place, and time.  Psychiatric:        Behavior: Behavior normal.      Musculoskeletal Exam: Cervical spine good range of motion.  He had some discomfort range of motion with lumbar spasm.  He had  tenderness over bilateral SI joints.  Shoulder joints, elbow joints, wrist joints, MCPs PIPs and DIPs with good range of motion with no synovitis.  Hip joints knee joints ankles MTPs PIPs been good range of motion with no synovitis.  There was no evidence of Achilles tendinitis or plantar fasciitis.  CDAI Exam: CDAI Score: Not documented Patient Global Assessment: Not documented; Provider Global Assessment: Not documented Swollen: Not documented; Tender: Not documented Joint Exam   Not documented   There is currently no information documented on the homunculus. Go to the Rheumatology activity and complete the homunculus joint exam.  Investigation: No additional findings.  Imaging: [place] Extrem Up Bilat Comp  Result Date: 03/29/2018 Ultrasound examination of right hand was performed per EULAR recommendations. Using 12 MHz transducer, grayscale and power Doppler right second, third MCPs, second third and fifth PIP joints and right wrist joint both dorsal and volar aspects were evaluated to look for synovitis or tenosynovitis.  The findings were there was no synovitis or tenosynovitis on ultrasound examination. 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 Impression: Ultrasound examination did not show any synovitis or tenosynovitis.   Recent Labs: Lab Results  Component Value Date   WBC 4.2 02/14/2018   HGB 15.7 02/14/2018   PLT 297 02/14/2018   NA 138 02/14/2018   K 4.5 02/14/2018   CL 103 02/14/2018   CO2 26 02/14/2018   GLUCOSE 94 02/14/2018   BUN 14 02/14/2018   CREATININE 1.30 02/14/2018   BILITOT 0.4 02/14/2018   ALKPHOS 55 08/17/2016   AST 17 02/14/2018   ALT 24 02/14/2018   PROT 7.4 02/14/2018   ALBUMIN 4.4 08/17/2016   CALCIUM 9.8 02/14/2018   GFRAA 80 02/14/2018   QFTBGOLD NEGATIVE 10/19/2016   QFTBGOLDPLUS NEGATIVE 09/17/2017    Speciality Comments: Prior therapy: Humira and Enbrel (inadequate response)  Procedures:  No procedures performed Allergies: Methotrexate derivatives   Assessment / Plan:     Visit Diagnoses:  Psoriatic arthritis (HCC)-patient complains of increased pain and discomfort in his bilateral hands.  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  No warmth swelling or effusion was noted on examination.  No synovitis was noted.  He denies any symptoms of carpal tunnel syndrome.  Ultrasound examination of right hand was performed today which did not show any synovitis.  Psoriasis-he has no active psoriasis lesions.  High risk medication use - Cosentyx 300 mg subcutaneous injection every month inadequate response to Humira and Enbrel.  His labs have been stable.  We will continue to monitor labs every 3 months.  Pain in both hands - Plan: US Extrem upper extremity right-did not show any synovitis.  As he has been having pain prior to his next injection have advised him to spread his dose every 2 weeks.  He will change Cosentyx 250 mg subcu injection every 2 weeks.  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  He states he has not been taking his blood pressure medications.  History of ADHD  History of gastroesophageal reflux (GERD)   Orders: Orders Placed This Encounter  Procedures  . US Extrem Up Bilat Comp   No orders of the defined types were placed in this encounter.     Follow-Up Instructions: Return in about 5 months (around 08/27/2018) for Psoriatic arthritis, DDD.   Pollyann SavoyShaili Roya Gieselman, MD  Note - This record has been created using Animal nutritionistDragon software.  Chart creation errors have been sought, but may not always  have been located. Such creation errors do not reflect on  the standard of medical care.

## 2018-03-29 ENCOUNTER — Encounter: Payer: Self-pay | Admitting: Physician Assistant

## 2018-03-29 ENCOUNTER — Other Ambulatory Visit (INDEPENDENT_AMBULATORY_CARE_PROVIDER_SITE_OTHER): Payer: Self-pay | Admitting: *Deleted

## 2018-03-29 ENCOUNTER — Ambulatory Visit (INDEPENDENT_AMBULATORY_CARE_PROVIDER_SITE_OTHER): Payer: Self-pay

## 2018-03-29 ENCOUNTER — Ambulatory Visit: Payer: BLUE CROSS/BLUE SHIELD | Admitting: Rheumatology

## 2018-03-29 VITALS — BP 134/89 | HR 84 | Resp 14 | Ht 73.0 in | Wt 214.5 lb

## 2018-03-29 DIAGNOSIS — L4050 Arthropathic psoriasis, unspecified: Secondary | ICD-10-CM | POA: Diagnosis not present

## 2018-03-29 DIAGNOSIS — Z79899 Other long term (current) drug therapy: Secondary | ICD-10-CM

## 2018-03-29 DIAGNOSIS — M79642 Pain in left hand: Secondary | ICD-10-CM

## 2018-03-29 DIAGNOSIS — Z8719 Personal history of other diseases of the digestive system: Secondary | ICD-10-CM

## 2018-03-29 DIAGNOSIS — M5134 Other intervertebral disc degeneration, thoracic region: Secondary | ICD-10-CM

## 2018-03-29 DIAGNOSIS — Z8659 Personal history of other mental and behavioral disorders: Secondary | ICD-10-CM

## 2018-03-29 DIAGNOSIS — M79641 Pain in right hand: Secondary | ICD-10-CM

## 2018-03-29 DIAGNOSIS — L409 Psoriasis, unspecified: Secondary | ICD-10-CM

## 2018-03-29 DIAGNOSIS — Z8679 Personal history of other diseases of the circulatory system: Secondary | ICD-10-CM

## 2018-03-29 DIAGNOSIS — M47816 Spondylosis without myelopathy or radiculopathy, lumbar region: Secondary | ICD-10-CM

## 2018-03-29 NOTE — Patient Instructions (Signed)
Standing Labs We placed an order today for your standing lab work.    Please come back and get your standing labs in April and every 3 months  We have open lab Monday through Friday from 8:30-11:30 AM and 1:30-4:00 PM  at the office of Dr. Margert Edsall.   You may experience shorter wait times on Monday and Friday afternoons. The office is located at [address], [address], [place], [place] [address] No appointment is necessary.   Labs are drawn by [facility].  You may receive a bill from [facility] for your lab work.  If you wish to have your labs drawn at another location, please call the office 24 hours in advance to send orders.  If you have any questions regarding directions or hours of operation,  please call [phone].   Just as a reminder please drink plenty of water prior to coming for your lab work. Thanks!  

## 2018-04-06 ENCOUNTER — Other Ambulatory Visit: Payer: Self-pay | Admitting: Rheumatology

## 2018-04-06 NOTE — Telephone Encounter (Signed)
Last Visit: 03/29/18  Next Visit: 09/26/18 Labs: 02/14/18 WNL TB Gold: 09/17/17 Neg   Okay to refill per Dr. Corliss Skains

## 2018-04-14 MED FILL — COSENTYX 300 MG DOSE-2 PENS: 150 | 28 days supply | Qty: 2 | Fill #0

## 2018-05-16 MED FILL — COSENTYX 300 MG DOSE-2 PENS: 150 | 28 days supply | Qty: 2 | Fill #1

## 2018-06-13 MED FILL — COSENTYX 300 MG DOSE-2 PENS: 150 | 28 days supply | Qty: 2 | Fill #2

## 2018-06-24 ENCOUNTER — Other Ambulatory Visit: Payer: Self-pay | Admitting: *Deleted

## 2018-06-24 DIAGNOSIS — Z79899 Other long term (current) drug therapy: Secondary | ICD-10-CM

## 2018-06-24 NOTE — Progress Notes (Signed)
Office Visit Note  Patient: Jacob Massey             Date of Birth: 04-18-1980           MRN: [id number]             PCP: Merri Brunette, MD Referring: Merri Brunette, MD Visit Date: 06/29/2018 Occupation: @GUAROCC @  Subjective:  Discuss medication options    History of Present Illness: Jacob Massey is a 38 y.o. male with history of psoriatic arthritis and DDD.  Patient is on Cosentyx 150 mg subcutaneous injections every 14 days.  He is having increased pain in both hands.  He has intermittent joint swelling in both hands.  He has noticed increased stiffness in both hands.  He denies any other joint pain or joint swelling.  He has no achilles tendonitis or plantar fasciitis.  He has no SI joint tenderness.  He denies any active psoriasis.    He had an ultrasound of the right hand performed on 03/29/2018 that did not reveal any signs of synovitis or tenosynovitis.  Activities of Daily Living:  Patient reports morning stiffness for 10-15 minutes.   Patient Reports nocturnal pain.  Difficulty dressing/grooming: Reports Difficulty climbing stairs: Reports Difficulty getting out of chair: Reports Difficulty using hands for taps, buttons, cutlery, and/or writing: Reports  Review of Systems  Constitutional: Positive for fatigue.  HENT: Negative for mouth sores, mouth dryness and nose dryness.   Eyes: Negative for pain, itching and dryness.  Respiratory: Negative for cough, hemoptysis, shortness of breath, wheezing and difficulty breathing.   Cardiovascular: Negative for palpitations, hypertension, irregular heartbeat and swelling in legs/feet.  Gastrointestinal: Negative for abdominal pain, blood in stool, constipation and diarrhea.  Endocrine: Negative for increased urination.  Genitourinary: Negative for painful urination and pelvic pain.  Musculoskeletal: Positive for arthralgias, joint pain, joint swelling and morning stiffness. Negative for myalgias, muscle weakness, muscle  tenderness and myalgias.  Skin: Negative for color change, rash, hair loss, nodules/bumps, redness, skin tightness and sensitivity to sunlight.  Allergic/Immunologic: Negative for susceptible to infections.  Neurological: Positive for dizziness. Negative for light-headedness, headaches, memory loss and weakness.  Hematological: Negative for bruising/bleeding tendency.  Psychiatric/Behavioral: Negative for confusion. The patient is not nervous/anxious.     PMFS History:  Patient Active Problem List   Diagnosis Date Noted  . High risk medication use 06/22/2016  . History of gastroesophageal reflux (GERD) 03/12/2016  . History of hypertension 03/12/2016  . 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 Psoriasis 12/03/2015  . Psoriatic arthritis (HCC) 12/03/2015  . Bicipital tendinitis 12/03/2015  . Hypertension 12/03/2015  . GERD (gastroesophageal reflux disease) 12/03/2015  . ADHD 12/03/2015  . Genital herpes 12/03/2015  . Depression 12/03/2015  . Noncompliance 12/03/2015  . Needle phobia 12/03/2015    Past Medical History:  Diagnosis Date  . ADHD 12/03/2015  . Bicipital tendinitis 12/03/2015  . Genital herpes 12/03/2015  . GERD (gastroesophageal reflux disease) 12/03/2015  . Hypertension 12/03/2015  . Psoriasis 12/03/2015  . Psoriatic arthritis (HCC) 12/03/2015  . Sinusitis   . Tendonitis     Family History  Problem Relation Age of Onset  . Arthritis Mother   . Diabetes Mother        borderline   . Heart attack Father   . Cancer Father   . ADD / ADHD Daughter    Past Surgical History:  Procedure Laterality Date  . VASECTOMY     Social History   Social History  Narrative  . Not on file    There is no immunization history on file for this patient.   Objective: Vital Signs: BP (!) 134/91 (BP Location: Left Arm, Patient Position: Sitting, Cuff Size: Normal)   Pulse 78   Resp 14   Ht 6\' 1"  (1.854 m)   Wt 221 lb (100.2 kg)    BMI 29.16 kg/m    Physical Exam Vitals signs and nursing note reviewed.  Constitutional:      Appearance: He is well-developed.  HENT:     Head: Normocephalic and atraumatic.  Eyes:     Conjunctiva/sclera: Conjunctivae normal.     Pupils: Pupils are equal, round, and reactive to light.  Neck:     Musculoskeletal: Normal range of motion and neck supple.  Cardiovascular:     Rate and Rhythm: Normal rate and regular rhythm.     Heart sounds: Normal heart sounds.  Pulmonary:     Effort: Pulmonary effort is normal.     Breath sounds: Normal breath sounds.  Abdominal:     General: Bowel sounds are normal.     Palpations: Abdomen is soft.  Lymphadenopathy:     Cervical: No cervical adenopathy.  Skin:    General: Skin is warm and dry.     Capillary Refill: Capillary refill takes less than 2 seconds.  Neurological:     Mental Status: He is alert and oriented to person, place, and time.  Psychiatric:        Behavior: Behavior normal.      Musculoskeletal Exam: C-spine, thoracic spine, and lumbar spine good ROM.  No midline spinal tenderness.  No SI joint tenderness.  Shoulder joints, elbow joints, wrist joints, MCPs,PIPs, and DIPs good ROM with no synovitis.  Hip joints, knee joints, ankle joints, MTPs, PIPs, and DIPs good ROM with no synovitis.  No warmth or effusion of knee joints.  No tenderness or swelling of ankle joints.  No achilles tendonitis or plantar fasciitis.    CDAI Exam: CDAI Score: Not documented Patient Global Assessment: Not documented; Provider Global Assessment: Not documented Swollen: Not documented; Tender: Not documented Joint Exam   Not documented   There is currently no information documented on the homunculus. Go to the Rheumatology activity and complete the homunculus joint exam.  Investigation: No additional findings.  Imaging: No results found.  Recent Labs: Lab Results  Component Value Date   WBC 5.2 06/24/2018   HGB 15.4 06/24/2018   PLT  288 06/24/2018   NA 139 06/24/2018   K 4.3 06/24/2018   CL 103 06/24/2018   CO2 26 06/24/2018   GLUCOSE 99 06/24/2018   BUN 15 06/24/2018   CREATININE 1.12 06/24/2018   BILITOT 0.4 06/24/2018   ALKPHOS 55 08/17/2016   AST 24 06/24/2018   ALT 30 06/24/2018   PROT 7.6 06/24/2018   ALBUMIN 4.4 08/17/2016   CALCIUM 9.9 06/24/2018   GFRAA 95 06/24/2018   QFTBGOLD NEGATIVE 10/19/2016   QFTBGOLDPLUS NEGATIVE 09/17/2017    Speciality Comments: Prior therapy: Humira and Enbrel (inadequate response)  Procedures:  No procedures performed Allergies: Dextrose and Methotrexate derivatives   Assessment / Plan:     Visit Diagnoses: Psoriatic arthritis (HCC): He had an ultrasound of the right hand performed on 03/29/2018 that did not reveal any signs of synovitis or tenosynovitis.  He has no synovitis or dactylitis on exam today.  He is having increased pain in both hands.  He has intermittent joint swelling and stiffness in both hands.  He has no other joint pain or joint swelling at this time.  He has no SI joint tenderness on exam.  He has no Achilles tenderness or plantar fasciitis.  He has no active psoriasis at this time.  He has been injecting Cosentyx 150 mg subcutaneously every 14 days.  He has not missed any doses recently.  We discussed adding on meloxicam 15 mg 1 tablet by mouth daily for pain relief.  Potential side effects and instructions were reviewed.  A prescription for meloxicam was sent to the pharmacy today.  We will monitor lab work every 3 months.  He was advised to notify us if his joint pain and joint swelling persist.  He will follow-up in the office in 3 months  Psoriasis: He has no active psoriasis at this time.  High risk medication use - Cosentyx 150 mg sq every 14 days. He has had an inadequate response to Humira and Enbrel. Last TB gold negative on 09/17/17 and will monitor yearly.  Most recent CBC/CMP within normal limits on 06/24/18. Standing orders are in place.    DDD  (degenerative disc disease), thoracic: He has no discomfort at this time.  Spondylosis of lumbar region without myelopathy or radiculopathy: He has no lower back pain at this time. He has no symptoms of radiculopathy at this time.   Other medical conditions are listed as follows:   History of depression  History of hypertension  History of ADHD  History of gastroesophageal reflux (GERD)  Needle phobia   Orders: No orders of the defined types were placed in this encounter.  Meds ordered this encounter  Medications  . meloxicam (MOBIC) 15 MG tablet    Sig: Take 1 tablet (15 mg total) by mouth daily.    Dispense:  30 tablet    Refill:  2    Face-to-face time spent with patient was 30 minutes. Greater than 50% of time was spent in counseling and coordination of care.  Follow-Up Instructions: Return in about 3 months (around 09/29/2018) for Psoriatic arthritis, DDD.   Gearldine Bienenstock, PA-C  Note - This record has been created using Dragon software.  Chart creation errors have been sought, but may not always  have been located. Such creation errors do not reflect on  the standard of medical care.

## 2018-06-25 LAB — CBC WITH DIFFERENTIAL/PLATELET
Absolute Monocytes: 364 cells/uL (ref 200–950)
Basophils Absolute: 52 cells/uL (ref 0–200)
Basophils Relative: 1 %
Eosinophils Absolute: 250 cells/uL (ref 15–500)
Eosinophils Relative: 4.8 %
HCT: 43.5 % (ref 38.5–50.0)
Hemoglobin: 15.4 g/dL (ref 13.2–17.1)
Lymphs Abs: 1820 cells/uL (ref 850–3900)
MCH: 30.9 pg (ref 27.0–33.0)
MCHC: 35.4 g/dL (ref 32.0–36.0)
MCV: 87.3 fL (ref 80.0–100.0)
MPV: 9 fL (ref 7.5–12.5)
Monocytes Relative: 7 %
Neutro Abs: 2714 cells/uL (ref 1500–7800)
Neutrophils Relative %: 52.2 %
Platelets: 288 10*3/uL (ref 140–400)
RBC: 4.98 10*6/uL (ref 4.20–5.80)
RDW: 13.3 % (ref 11.0–15.0)
Total Lymphocyte: 35 %
WBC: 5.2 10*3/uL (ref 3.8–10.8)

## 2018-06-25 LAB — COMPLETE METABOLIC PANEL WITH GFR
AG Ratio: 1.6 (calc) (ref 1.0–2.5)
ALT: 30 U/L (ref 9–46)
AST: 24 U/L (ref 10–40)
Albumin: 4.7 g/dL (ref 3.6–5.1)
Alkaline phosphatase (APISO): 44 U/L (ref 36–130)
BUN: 15 mg/dL (ref 7–25)
CO2: 26 mmol/L (ref 20–32)
Calcium: 9.9 mg/dL (ref 8.6–10.3)
Chloride: 103 mmol/L (ref 98–110)
Creat: 1.12 mg/dL (ref 0.60–1.35)
GFR, Est African American: 95 mL/min/{1.73_m2} (ref >=60)
GFR, Est Non African American: 82 mL/min/{1.73_m2} (ref >=60)
Globulin: 2.9 g/dL (calc) (ref 1.9–3.7)
Glucose, Bld: 99 mg/dL (ref 65–99)
Potassium: 4.3 mmol/L (ref 3.5–5.3)
Sodium: 139 mmol/L (ref 135–146)
Total Bilirubin: 0.4 mg/dL (ref 0.2–1.2)
Total Protein: 7.6 g/dL (ref 6.1–8.1)

## 2018-06-29 ENCOUNTER — Encounter: Payer: Self-pay | Admitting: Physician Assistant

## 2018-06-29 ENCOUNTER — Ambulatory Visit: Payer: BLUE CROSS/BLUE SHIELD | Admitting: Physician Assistant

## 2018-06-29 ENCOUNTER — Other Ambulatory Visit: Payer: Self-pay

## 2018-06-29 VITALS — BP 134/91 | HR 78 | Resp 14 | Ht 73.0 in | Wt 221.0 lb

## 2018-06-29 DIAGNOSIS — Z8719 Personal history of other diseases of the digestive system: Secondary | ICD-10-CM

## 2018-06-29 DIAGNOSIS — Z8659 Personal history of other mental and behavioral disorders: Secondary | ICD-10-CM

## 2018-06-29 DIAGNOSIS — L409 Psoriasis, unspecified: Secondary | ICD-10-CM

## 2018-06-29 DIAGNOSIS — Z79899 Other long term (current) drug therapy: Secondary | ICD-10-CM

## 2018-06-29 DIAGNOSIS — F40298 Other specified phobia: Secondary | ICD-10-CM

## 2018-06-29 DIAGNOSIS — M5134 Other intervertebral disc degeneration, thoracic region: Secondary | ICD-10-CM

## 2018-06-29 DIAGNOSIS — L4050 Arthropathic psoriasis, unspecified: Secondary | ICD-10-CM | POA: Diagnosis not present

## 2018-06-29 DIAGNOSIS — Z8679 Personal history of other diseases of the circulatory system: Secondary | ICD-10-CM

## 2018-06-29 DIAGNOSIS — M47816 Spondylosis without myelopathy or radiculopathy, lumbar region: Secondary | ICD-10-CM

## 2018-06-29 MED ORDER — MELOXICAM 15 MG PO TABS: 15.0000 mg | ORAL_TABLET | Freq: Every day | ORAL | 2 refills | Status: DC

## 2018-06-29 MED FILL — MELOXICAM 15 MG TABLET: 15 | 30 days supply | Qty: 30 | Fill #0

## 2018-06-29 NOTE — Patient Instructions (Addendum)
Standing Labs We placed an order today for your standing lab work.    Please come back and get your standing labs in August and every 3 months   We have open lab daily Monday through Thursday from 8:30-12:30 PM and 1:30-4:30 PM and Friday from 8:30-12:30 PM and 1:30 -4:00 PM at the office of Dr. Pollyann Savoy.   You may experience shorter wait times on Monday and Friday afternoons. The office is located at [address], [place], [place] [address] No appointment is necessary.   Labs are drawn by [facility].  You may receive a bill from [facility] for your lab work.  If you wish to have your labs drawn at another location, please call the office 24 hours in advance to send orders.  If you have any questions regarding directions or hours of operation,  please call [phone].   Just as a reminder please drink plenty of water prior to coming for your lab work. Thanks!  Meloxicam capsules What is this medicine? MELOXICAM (mel OX i cam) is a non-steroidal anti-inflammatory drug (NSAID). It is used to reduce swelling and to treat pain. It is used for osteoarthritis. This medicine may be used for other purposes; ask your health care provider or pharmacist if you have questions. COMMON BRAND NAME(S): Vivlodex What should I tell my health care provider before I take this medicine? They need to know if you have any of these conditions: -bleeding disorders -cigarette smoker -coronary artery bypass graft (CABG) surgery within the past 2 weeks -drink more than 3 alcohol-containing drinks per day -heart disease -high blood pressure -history of stomach bleeding -kidney disease -liver disease -lung or breathing disease, like asthma -stomach or intestine problems -an unusual or allergic reaction to meloxicam, aspirin, other NSAIDs, other medicines, foods, dyes, or preservatives -pregnant or trying to get pregnant -breast-feeding How should I use this medicine? Take this medicine by  mouth with a full glass of water. Follow the directions on the prescription label. You can take it with or without food. If it upsets your stomach, take it with food. Take your medicine at regular intervals. Do not take it more often than directed. Do not stop taking except on your doctor's advice. A special MedGuide will be given to you by the pharmacist with each prescription and refill. Be sure to read this information carefully each time. Talk to your pediatrician regarding the use of this medicine in children. Special care may be needed. Patients over 44 years old may have a stronger reaction and need a smaller dose. Overdosage: If you think you have taken too much of this medicine contact a poison control center or emergency room at once. NOTE: This medicine is only for you. Do not share this medicine with others. What if I miss a dose? If you miss a dose, take it as soon as you can. If it is almost time for your next dose, take only that dose. Do not take double or extra doses. What may interact with this medicine? Do not take this medicine with any of the following medications: -cidofovir -ketorolac This medicine may also interact with the following medications: -aspirin and aspirin-like medicines -certain medicines for blood pressure, heart disease, irregular heart beat -certain medicines for depression, anxiety, or psychotic disturbances -certain medicines that treat or prevent blood clots like warfarin, enoxaparin, dalteparin, apixaban, dabigatran, rivaroxaban -cyclosporine -diuretics -methotrexate -other NSAIDs, medicines for pain and inflammation, like ibuprofen and naproxen -pemetrexed This list may not describe all possible  interactions. Give your health care provider a list of all the medicines, herbs, non-prescription drugs, or dietary supplements you use. Also tell them if you smoke, drink alcohol, or use illegal drugs. Some items may interact with your medicine. What should  I watch for while using this medicine? Tell your doctor or healthcare professional if your symptoms do not start to get better or if they get worse. Do not take other medicines that contain aspirin, ibuprofen, or naproxen with this medicine. Side effects such as stomach upset, nausea, or ulcers may be more likely to occur. Many medicines available without a prescription should not be taken with this medicine. This medicine can cause ulcers and bleeding in the stomach and intestines at any time during treatment. This can happen with no warning and may cause death. There is increased risk with taking this medicine for a long time. Smoking, drinking alcohol, older age, and poor health can also increase risks. Call your doctor right away if you have stomach pain or blood in your vomit or stool. This medicine does not prevent heart attack or stroke. In fact, this medicine may increase the chance of a heart attack or stroke. The chance may increase with longer use of this medicine and in people who have heart disease. If you take aspirin to prevent heart attack or stroke, talk with your doctor or health care professional. What side effects may I notice from receiving this medicine? Side effects that you should report to your doctor or health care professional as soon as possible: -allergic reactions like skin rash, itching or hives, swelling of the face, lips, or tongue -nausea, vomiting -signs and symptoms of a blood clot such as breathing problems; changes in vision; chest pain; severe, sudden headache; pain, swelling, warmth in the leg; trouble speaking; sudden numbness or weakness of the face, arm, or leg -signs and symptoms of bleeding such as bloody or black, tarry stools; red or dark-brown urine; spitting up blood or brown material that looks like coffee grounds; red spots on the skin; unusual bruising or bleeding from the eye, gums, or nose -signs and symptoms of liver injury like dark yellow or brown  urine; general ill feeling or flu-like symptoms; light-colored stools; loss of appetite; nausea; right upper belly pain; unusually weak or tired; yellowing of the eyes or skin -signs and symptoms of stroke like changes in vision; confusion; trouble speaking or understanding; severe headaches; sudden numbness or weakness of the face, arm, or leg; trouble walking; dizziness; loss of balance or coordination Side effects that usually do not require medical attention (report to your doctor or health care professional if they continue or are bothersome): -constipation -diarrhea -gas This list may not describe all possible side effects. Call your doctor for medical advice about side effects. You may report side effects to FDA at 1-800-FDA-1088. Where should I keep my medicine? Keep out of the reach of children. Store at room temperature between 15 and 30 degrees C (59 and 86 degrees F). Throw away any unused medicine after the expiration date. NOTE: This sheet is a summary. It may not cover all possible information. If you have questions about this medicine, talk to your doctor, pharmacist, or health care provider.  2019 Elsevier/Gold Standard (2017-05-21 11:23:51)

## 2018-07-06 ENCOUNTER — Other Ambulatory Visit: Payer: Self-pay | Admitting: Rheumatology

## 2018-07-06 NOTE — Telephone Encounter (Signed)
Last Visit: 07/30/18 Next Visit: 09/26/18 Labs: 06/24/18 WNL TB Gold: 09/17/17 Neg   Okay to refill per Dr. Corliss Skains

## 2018-07-12 MED FILL — COSENTYX 300 MG DOSE-2 PENS: 150 | 28 days supply | Qty: 2 | Fill #0

## 2018-08-10 MED FILL — MELOXICAM 15 MG TABLET: 15 | 30 days supply | Qty: 30 | Fill #1

## 2018-08-10 MED FILL — COSENTYX 300 MG DOSE-2 PENS: 150 | 28 days supply | Qty: 2 | Fill #1

## 2018-09-06 MED FILL — COSENTYX 300 MG DOSE-2 PENS: 150 | 28 days supply | Qty: 2 | Fill #2

## 2018-09-26 ENCOUNTER — Ambulatory Visit: Payer: Self-pay | Admitting: Physician Assistant

## 2018-09-26 ENCOUNTER — Telehealth: Payer: Self-pay | Admitting: Pharmacist

## 2018-09-26 DIAGNOSIS — L4050 Arthropathic psoriasis, unspecified: Secondary | ICD-10-CM

## 2018-09-26 NOTE — Telephone Encounter (Signed)
Received prior authorization request from [facility]. Submitted a Prior Authorization request to [facility] for [place] via fax. Will update once we receive a response.  PA # [id number] Phone: [phone]

## 2018-09-29 ENCOUNTER — Other Ambulatory Visit: Payer: Self-pay | Admitting: Rheumatology

## 2018-09-29 NOTE — Telephone Encounter (Signed)
Last Visit: 06/29/18 Next Visit: 10/17/18 Labs: 06/24/18 WNL TB Gold: 09/17/17 Neg   Left message to advise patient he is due to update labs  Okay to refill 30 day supply Cosentyx?

## 2018-09-29 NOTE — Telephone Encounter (Signed)
ok 

## 2018-09-29 NOTE — Telephone Encounter (Signed)
Received notification from [facility] regarding a prior authorization for [place]. Authorization has been APPROVED from 09/29/18 to 09/29/19.   Will send document to scan center.  Authorization # [id number] Phone # [phone]  10:27 AM Beatriz Chancellor, CPhT

## 2018-10-03 ENCOUNTER — Other Ambulatory Visit: Payer: Self-pay

## 2018-10-03 DIAGNOSIS — Z79899 Other long term (current) drug therapy: Secondary | ICD-10-CM

## 2018-10-03 NOTE — Progress Notes (Signed)
Office Visit Note  Patient: Jacob Massey             Date of Birth: [birth year]           MRN: [id number]             PCP: Merri Brunette, MD Referring: Merri Brunette, MD Visit Date: 10/17/2018 Occupation: @GUAROCC @  Subjective:  Pain in both hands    History of Present Illness: Jacob Massey is a 38 y.o. male with history of psoriatic arthritis and DDD.  He is on Cosentyx 150 mg sq injections every 14 days.  He has not missed any doses recently.  He denies any recent flares.  He continues to have pain in bilateral third MCP and PIP joints.  He denies any joint swelling.  He does have stiffness in his hands at times.  He also has morning stiffness in his lower back lasting about 2 to 5 minutes.  He denies any Achilles tendinitis or plantar fasciitis.  He continues to take meloxicam 15 mg 1 tablet by mouth daily 5 days a week.  He typically does not take meloxicam on the weekends.  He would like a refill of meloxicam today.   Activities of Daily Living:  Patient reports morning stiffness for 2  minutes.   Patient Denies nocturnal pain.  Difficulty dressing/grooming: Denies Difficulty climbing stairs: Reports Difficulty getting out of chair: Denies Difficulty using hands for taps, buttons, cutlery, and/or writing: Denies  Review of Systems  Constitutional: Positive for fatigue. Negative for night sweats.  HENT: Negative for mouth sores, mouth dryness and nose dryness.   Eyes: Negative for redness and dryness.  Respiratory: Negative for cough, hemoptysis, shortness of breath and difficulty breathing.   Cardiovascular: Positive for palpitations. Negative for chest pain, hypertension, irregular heartbeat and swelling in legs/feet.  Gastrointestinal: Negative for blood in stool, constipation and diarrhea.  Endocrine: Negative for increased urination.  Genitourinary: Negative for painful urination.  Musculoskeletal: Positive for arthralgias, joint pain and morning stiffness. Negative  for joint swelling, myalgias, muscle weakness, muscle tenderness and myalgias.  Skin: Negative for color change, rash, hair loss, nodules/bumps, skin tightness, ulcers and sensitivity to sunlight.  Allergic/Immunologic: Negative for susceptible to infections.  Neurological: Negative for dizziness, fainting, memory loss, night sweats and weakness.  Hematological: Negative for swollen glands.  Psychiatric/Behavioral: Negative for depressed mood and sleep disturbance. The patient is not nervous/anxious.     PMFS History:  Patient Active Problem List   Diagnosis Date Noted  . High risk medication use 06/22/2016  . History of gastroesophageal reflux (GERD) 03/12/2016  . History of hypertension 03/12/2016  . 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 Psoriasis 12/03/2015  . Psoriatic arthritis (HCC) 12/03/2015  . Bicipital tendinitis 12/03/2015  . Hypertension 12/03/2015  . GERD (gastroesophageal reflux disease) 12/03/2015  . ADHD 12/03/2015  . Genital herpes 12/03/2015  . Depression 12/03/2015  . Noncompliance 12/03/2015  . Needle phobia 12/03/2015    Past Medical History:  Diagnosis Date  . ADHD 12/03/2015  . Bicipital tendinitis 12/03/2015  . Genital herpes 12/03/2015  . GERD (gastroesophageal reflux disease) 12/03/2015  . Hypertension 12/03/2015  . Psoriasis 12/03/2015  . Psoriatic arthritis (HCC) 12/03/2015  . Sinusitis   . Tendonitis     Family History  Problem Relation Age of Onset  . Arthritis Mother   . Diabetes Mother        borderline   . Heart attack Father   . Cancer Father   .  ADD / ADHD Daughter    Past Surgical History:  Procedure Laterality Date  . VASECTOMY     Social History   Social History Narrative  . Not on file    There is no immunization history on file for this patient.   Objective: Vital Signs: There were no vitals taken for this visit.   Physical Exam Vitals signs and nursing note reviewed.   Constitutional:      Appearance: He is well-developed.  HENT:     Head: Normocephalic and atraumatic.  Eyes:     Conjunctiva/sclera: Conjunctivae normal.     Pupils: Pupils are equal, round, and reactive to light.  Neck:     Musculoskeletal: Normal range of motion and neck supple.  Cardiovascular:     Rate and Rhythm: Normal rate and regular rhythm.     Heart sounds: Normal heart sounds.  Pulmonary:     Effort: Pulmonary effort is normal.     Breath sounds: Normal breath sounds.  Abdominal:     General: Bowel sounds are normal.     Palpations: Abdomen is soft.  Skin:    General: Skin is warm and dry.     Capillary Refill: Capillary refill takes less than 2 seconds.  Neurological:     Mental Status: He is alert and oriented to person, place, and time.  Psychiatric:        Behavior: Behavior normal.      Musculoskeletal Exam: C-spine, thoracic spine, lumbar spine good range of motion.  No midline spinal tenderness.  No SI joint tenderness.  Shoulder joints, elbow joints, wrist joints, MCPs, PIPs of DIPs good range of motion no synovitis.  He has tenderness of bilateral third MCP and PIP joints but no synovitis was noted.  He has complete fist formation bilaterally.  Hip joints, knee joints, ankle joints, MTPs, PIPs, DIPs good range of motion no synovitis.  No warmth or effusion of bilateral knee joints.  No tenderness or swelling of ankle joints.  No Achilles tendinitis or plantar fasciitis.  CDAI Exam: CDAI Score: 0.8  Patient Global: 4 mm; Provider Global: 4 mm Swollen: 0 ; Tender: 0  Joint Exam   No joint exam has been documented for this visit   There is currently no information documented on the homunculus. Go to the Rheumatology activity and complete the homunculus joint exam.  Investigation: No additional findings.  Imaging: No results found.  Recent Labs: Lab Results  Component Value Date   WBC 5.3 10/03/2018   HGB 15.6 10/03/2018   PLT 296 10/03/2018   NA  139 10/03/2018   K 4.8 10/03/2018   CL 102 10/03/2018   CO2 28 10/03/2018   GLUCOSE 117 (H) 10/03/2018   BUN 10 10/03/2018   CREATININE 1.21 10/03/2018   BILITOT 0.4 10/03/2018   ALKPHOS 55 08/17/2016   AST 20 10/03/2018   ALT 31 10/03/2018   PROT 7.4 10/03/2018   ALBUMIN 4.4 08/17/2016   CALCIUM 10.1 10/03/2018   GFRAA 86 10/03/2018   QFTBGOLD NEGATIVE 10/19/2016   QFTBGOLDPLUS NEGATIVE 09/17/2017    Speciality Comments: Prior therapy: Humira and Enbrel (inadequate response)  Procedures:  No procedures performed Allergies: Dextrose and Methotrexate derivatives   Assessment / Plan:     Visit Diagnoses: Psoriatic arthritis (HCC) - Ultrasound of the right hand performed on 03/29/2018 that did not reveal any signs of synovitis or tenosynovitis: He has no synovitis or dactylitis on exam.  He has not had any recent psoriatic arthritis flares.  He has tenderness of bilateral third MCP and PIP joints but no synovitis was noted.  He has complete fist formation bilaterally.  He has no Achilles tendinitis or plantar fasciitis.  No SI joint tenderness was noted on exam.  He has no active psoriasis.  He is clinically doing well on Cosentyx 150 mg subcutaneous injections every 14 days.  He feels as though spacing the dose of Cosentyx has been beneficial.  He will continue on Cosentyx as prescribed.  He does not need any refills at this time.  He has been taking meloxicam 15 mg 1 tablet by mouth daily which has been helping with his overall discomfort and stiffness.  He requested a refill today.  We will continue to monitor lab work closely.  He was advised to notify us if he develops increased joint pain or joint swelling.  He will follow-up in the office in 5 months.  Psoriasis: He has no active psoriasis at this time.  No nail dystrophy was noted.  High risk medication use - Cosentyx 150 mg every 14 days.  Last TB gold negative 09/17/2017.  Due for TB gold day and will monitor yearly.  Most recent  CBC/CMP within normal limits on 10/03/2018.  Due for CBC/CMP in November and will monitor every 3 months.  Standing orders in place.  He has had an inadequate response to Humira and Enbrel.  He was advised to hold Cosentyx if he develops signs or symptoms of an infection and to resume once the infection has completely cleared.  We discussed the importance of social distancing and following the standard precautions recommended by the CDC.- Plan: QuantiFERON-TB Gold Plus  DDD (degenerative disc disease), thoracic: No midline spinal tenderness.  Spondylosis of lumbar region without myelopathy or radiculopathy: He has no lower back pain at this time.  He has good range of motion with no discomfort.  No midline spinal tenderness.  Other medical conditions are listed as follows:  History of depression  History of hypertension  History of ADHD  History of gastroesophageal reflux (GERD)  Needle phobia  Orders: Orders Placed This Encounter  Procedures  . QuantiFERON-TB Gold Plus   Meds ordered this encounter  Medications  . meloxicam (MOBIC) 15 MG tablet    Sig: Take 1 tablet (15 mg total) by mouth daily.    Dispense:  30 tablet    Refill:  2      Follow-Up Instructions: Return in about 5 months (around 03/19/2019) for Psoriatic arthritis, DDD.   Ofilia Neas, PA-C  Note - This record has been created using Dragon software.  Chart creation errors have been sought, but may not always  have been located. Such creation errors do not reflect on  the standard of medical care.

## 2018-10-04 LAB — CBC WITH DIFFERENTIAL/PLATELET
Absolute Monocytes: 482 cells/uL (ref 200–950)
Basophils Absolute: 42 cells/uL (ref 0–200)
Basophils Relative: 0.8 %
Eosinophils Absolute: 191 cells/uL (ref 15–500)
Eosinophils Relative: 3.6 %
HCT: 46.5 % (ref 38.5–50.0)
Hemoglobin: 15.6 g/dL (ref 13.2–17.1)
Lymphs Abs: 2088 cells/uL (ref 850–3900)
MCH: 29.9 pg (ref 27.0–33.0)
MCHC: 33.5 g/dL (ref 32.0–36.0)
MCV: 89.1 fL (ref 80.0–100.0)
MPV: 9.5 fL (ref 7.5–12.5)
Monocytes Relative: 9.1 %
Neutro Abs: 2496 cells/uL (ref 1500–7800)
Neutrophils Relative %: 47.1 %
Platelets: 296 10*3/uL (ref 140–400)
RBC: 5.22 10*6/uL (ref 4.20–5.80)
RDW: 13.1 % (ref 11.0–15.0)
Total Lymphocyte: 39.4 %
WBC: 5.3 10*3/uL (ref 3.8–10.8)

## 2018-10-04 LAB — COMPLETE METABOLIC PANEL WITH GFR
AG Ratio: 1.6 (calc) (ref 1.0–2.5)
ALT: 31 U/L (ref 9–46)
AST: 20 U/L (ref 10–40)
Albumin: 4.6 g/dL (ref 3.6–5.1)
Alkaline phosphatase (APISO): 50 U/L (ref 36–130)
BUN: 10 mg/dL (ref 7–25)
CO2: 28 mmol/L (ref 20–32)
Calcium: 10.1 mg/dL (ref 8.6–10.3)
Chloride: 102 mmol/L (ref 98–110)
Creat: 1.21 mg/dL (ref 0.60–1.35)
GFR, Est African American: 86 mL/min/{1.73_m2} (ref >=60)
GFR, Est Non African American: 74 mL/min/{1.73_m2} (ref >=60)
Globulin: 2.8 g/dL (calc) (ref 1.9–3.7)
Glucose, Bld: 117 mg/dL — ABNORMAL HIGH (ref 65–99)
Potassium: 4.8 mmol/L (ref 3.5–5.3)
Sodium: 139 mmol/L (ref 135–146)
Total Bilirubin: 0.4 mg/dL (ref 0.2–1.2)
Total Protein: 7.4 g/dL (ref 6.1–8.1)

## 2018-10-06 MED FILL — COSENTYX 300 MG DOSE-2 PENS: 150 | 28 days supply | Qty: 2 | Fill #0

## 2018-10-17 ENCOUNTER — Other Ambulatory Visit: Payer: Self-pay

## 2018-10-17 ENCOUNTER — Ambulatory Visit: Payer: BLUE CROSS/BLUE SHIELD | Admitting: Physician Assistant

## 2018-10-17 ENCOUNTER — Encounter: Payer: Self-pay | Admitting: Physician Assistant

## 2018-10-17 VITALS — BP 145/86 | HR 63 | Resp 15 | Ht 73.0 in | Wt 228.0 lb

## 2018-10-17 DIAGNOSIS — F40298 Other specified phobia: Secondary | ICD-10-CM

## 2018-10-17 DIAGNOSIS — L409 Psoriasis, unspecified: Secondary | ICD-10-CM | POA: Diagnosis not present

## 2018-10-17 DIAGNOSIS — L4050 Arthropathic psoriasis, unspecified: Secondary | ICD-10-CM | POA: Diagnosis not present

## 2018-10-17 DIAGNOSIS — Z79899 Other long term (current) drug therapy: Secondary | ICD-10-CM | POA: Diagnosis not present

## 2018-10-17 DIAGNOSIS — Z8679 Personal history of other diseases of the circulatory system: Secondary | ICD-10-CM

## 2018-10-17 DIAGNOSIS — M5134 Other intervertebral disc degeneration, thoracic region: Secondary | ICD-10-CM

## 2018-10-17 DIAGNOSIS — Z8719 Personal history of other diseases of the digestive system: Secondary | ICD-10-CM

## 2018-10-17 DIAGNOSIS — Z8659 Personal history of other mental and behavioral disorders: Secondary | ICD-10-CM

## 2018-10-17 DIAGNOSIS — M47816 Spondylosis without myelopathy or radiculopathy, lumbar region: Secondary | ICD-10-CM

## 2018-10-17 MED ORDER — MELOXICAM 15 MG PO TABS: 15.0000 mg | ORAL_TABLET | Freq: Every day | ORAL | 2 refills | Status: DC

## 2018-10-17 MED FILL — MELOXICAM 15 MG TABLET: 15 | 30 days supply | Qty: 30 | Fill #0

## 2018-10-17 NOTE — Patient Instructions (Signed)
Standing Labs We placed an order today for your standing lab work.    Please come back and get your standing labs in November and every 3 months   We have open lab daily Monday through Thursday from 8:30-12:30 PM and 1:30-4:30 PM and Friday from 8:30-12:30 PM and 1:30 -4:00 PM at the office of Dr. Shaili Deveshwar.   You may experience shorter wait times on Monday and Friday afternoons. The office is located at [address], [address], [place], [place] [address] No appointment is necessary.   Labs are drawn by [facility].  You may receive a bill from [facility] for your lab work.  If you wish to have your labs drawn at another location, please call the office 24 hours in advance to send orders.  If you have any questions regarding directions or hours of operation,  please call [phone].   Just as a reminder please drink plenty of water prior to coming for your lab work. Thanks 

## 2018-10-20 LAB — QUANTIFERON-TB GOLD PLUS
Mitogen-NIL: >10 IU/mL
NIL: 0.03 IU/mL
QuantiFERON-TB Gold Plus: NEGATIVE
TB1-NIL: 0 IU/mL
TB2-NIL: 0 IU/mL

## 2018-10-20 NOTE — Progress Notes (Signed)
TB gold negative

## 2018-11-03 ENCOUNTER — Other Ambulatory Visit: Payer: Self-pay | Admitting: Rheumatology

## 2018-11-03 NOTE — Telephone Encounter (Signed)
Last Visit: 10/17/18 Next Visit: 03/20/19 Labs: 10/03/18 Glucose is 117. Rest of CMP WNL. CBC WNL TB Gold: 10/17/18 Neg   Okay to refill per Dr. Estanislado Pandy

## 2018-11-09 MED FILL — COSENTYX 300 MG DOSE-2 PENS: 150 | 28 days supply | Qty: 2 | Fill #0

## 2018-12-07 MED FILL — COSENTYX 300 MG DOSE-2 PENS: 150 | 28 days supply | Qty: 2 | Fill #1

## 2019-01-04 MED FILL — COSENTYX 300 MG DOSE-2 PENS: 150 | 28 days supply | Qty: 2 | Fill #2

## 2019-01-04 MED FILL — MELOXICAM 15 MG TABLET: 15 | 30 days supply | Qty: 30 | Fill #1

## 2019-01-30 ENCOUNTER — Other Ambulatory Visit: Payer: Self-pay | Admitting: Rheumatology

## 2019-01-30 NOTE — Telephone Encounter (Signed)
Last Visit: 10/17/2018 Next Visit: 03/20/2019 Labs: 10/03/2018 Glucose is 117. Rest of CMP WNL. CBC WNL TB Gold:  10/17/2018 negative   Advised patient he is due to update lab work. Patient states he can update labs next Monday, he will call that morning with preferred location so orders can be released.   Okay to refill 30 day supply, per Dr. Estanislado Pandy.

## 2019-01-31 MED FILL — COSENTYX 300 MG DOSE-2 PENS: 150 | 28 days supply | Qty: 2 | Fill #0

## 2019-02-13 ENCOUNTER — Other Ambulatory Visit: Payer: Self-pay | Admitting: *Deleted

## 2019-02-13 ENCOUNTER — Telehealth: Payer: Self-pay | Admitting: Rheumatology

## 2019-02-13 DIAGNOSIS — Z79899 Other long term (current) drug therapy: Secondary | ICD-10-CM

## 2019-02-13 NOTE — Telephone Encounter (Signed)
Lab orders released.  

## 2019-02-13 NOTE — Telephone Encounter (Signed)
Patient called requesting labwork orders be sent to [facility] on [address].  

## 2019-02-17 ENCOUNTER — Telehealth: Payer: Self-pay | Admitting: Rheumatology

## 2019-02-17 ENCOUNTER — Other Ambulatory Visit: Payer: Self-pay

## 2019-02-17 DIAGNOSIS — Z79899 Other long term (current) drug therapy: Secondary | ICD-10-CM

## 2019-02-17 NOTE — Telephone Encounter (Signed)
Patient called requesting labwork orders be sent to [facility] on [address].  Patient states he has appointment on Monday, 02/20/19.

## 2019-02-17 NOTE — Telephone Encounter (Signed)
Lab orders released for [facility].  

## 2019-02-20 LAB — COMPLETE METABOLIC PANEL WITH GFR
AG Ratio: 1.6 (calc) (ref 1.0–2.5)
ALT: 22 U/L (ref 9–46)
AST: 13 U/L (ref 10–40)
Albumin: 4.6 g/dL (ref 3.6–5.1)
Alkaline phosphatase (APISO): 52 U/L (ref 36–130)
BUN: 15 mg/dL (ref 7–25)
CO2: 30 mmol/L (ref 20–32)
Calcium: 10.1 mg/dL (ref 8.6–10.3)
Chloride: 102 mmol/L (ref 98–110)
Creat: 1.23 mg/dL (ref 0.60–1.35)
GFR, Est African American: 85 mL/min/{1.73_m2} (ref >=60)
GFR, Est Non African American: 73 mL/min/{1.73_m2} (ref >=60)
Globulin: 2.9 g/dL (calc) (ref 1.9–3.7)
Glucose, Bld: 128 mg/dL — ABNORMAL HIGH (ref 65–99)
Potassium: 4.6 mmol/L (ref 3.5–5.3)
Sodium: 139 mmol/L (ref 135–146)
Total Bilirubin: 0.5 mg/dL (ref 0.2–1.2)
Total Protein: 7.5 g/dL (ref 6.1–8.1)

## 2019-02-20 LAB — CBC WITH DIFFERENTIAL/PLATELET
Absolute Monocytes: 472 cells/uL (ref 200–950)
Basophils Absolute: 41 cells/uL (ref 0–200)
Basophils Relative: 0.7 %
Eosinophils Absolute: 230 cells/uL (ref 15–500)
Eosinophils Relative: 3.9 %
HCT: 45.6 % (ref 38.5–50.0)
Hemoglobin: 16 g/dL (ref 13.2–17.1)
Lymphs Abs: 1404 cells/uL (ref 850–3900)
MCH: 30.8 pg (ref 27.0–33.0)
MCHC: 35.1 g/dL (ref 32.0–36.0)
MCV: 87.9 fL (ref 80.0–100.0)
MPV: 9.4 fL (ref 7.5–12.5)
Monocytes Relative: 8 %
Neutro Abs: 3752 cells/uL (ref 1500–7800)
Neutrophils Relative %: 63.6 %
Platelets: 305 10*3/uL (ref 140–400)
RBC: 5.19 10*6/uL (ref 4.20–5.80)
RDW: 13 % (ref 11.0–15.0)
Total Lymphocyte: 23.8 %
WBC: 5.9 10*3/uL (ref 3.8–10.8)

## 2019-02-28 ENCOUNTER — Other Ambulatory Visit: Payer: Self-pay | Admitting: Rheumatology

## 2019-02-28 NOTE — Telephone Encounter (Signed)
Last Visit: 10/17/2018 Next Visit: 03/20/2019 Labs: 02/20/19 Glucose is elevated-128. Rest of CMP WNL. CBC WNL. TB Gold:  10/17/2018 negative   Okay to refill per Dr. Corliss Skains

## 2019-03-02 MED FILL — COSENTYX 300 MG DOSE-2 PENS: 150 | 28 days supply | Qty: 2 | Fill #0

## 2019-03-14 NOTE — Progress Notes (Deleted)
Office Visit Note  Patient: Jacob Massey             Date of Birth: 1980-06-28           MRN: [id number]             PCP: Merri Brunette, MD Referring: Merri Brunette, MD Visit Date: 03/20/2019 Occupation: @GUAROCC @  Subjective:  No chief complaint on file.   History of Present Illness: Jacob Massey is a 39 y.o. male ***   Activities of Daily Living:  Patient reports morning stiffness for *** {minute/hour:19697}.   Patient {ACTIONS;DENIES/REPORTS:21021675::"Denies"} nocturnal pain.  Difficulty dressing/grooming: {ACTIONS;DENIES/REPORTS:21021675::"Denies"} Difficulty climbing stairs: {ACTIONS;DENIES/REPORTS:21021675::"Denies"} Difficulty getting out of chair: {ACTIONS;DENIES/REPORTS:21021675::"Denies"} Difficulty using hands for taps, buttons, cutlery, and/or writing: {ACTIONS;DENIES/REPORTS:21021675::"Denies"}  No Rheumatology ROS completed.   PMFS History:  Patient Active Problem List   Diagnosis Date Noted  . High risk medication use 06/22/2016  . History of gastroesophageal reflux (GERD) 03/12/2016  . History of hypertension 03/12/2016  . 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 Psoriasis 12/03/2015  . Psoriatic arthritis (HCC) 12/03/2015  . Bicipital tendinitis 12/03/2015  . Hypertension 12/03/2015  . GERD (gastroesophageal reflux disease) 12/03/2015  . ADHD 12/03/2015  . Genital herpes 12/03/2015  . Depression 12/03/2015  . Noncompliance 12/03/2015  . Needle phobia 12/03/2015    Past Medical History:  Diagnosis Date  . ADHD 12/03/2015  . Bicipital tendinitis 12/03/2015  . Genital herpes 12/03/2015  . GERD (gastroesophageal reflux disease) 12/03/2015  . Hypertension 12/03/2015  . Psoriasis 12/03/2015  . Psoriatic arthritis (HCC) 12/03/2015  . Sinusitis   . Tendonitis     Family History  Problem Relation Age of Onset  . Arthritis Mother   . Diabetes Mother        borderline   . Heart attack Father   . Cancer  Father   . ADD / ADHD Daughter    Past Surgical History:  Procedure Laterality Date  . VASECTOMY     Social History   Social History Narrative  . Not on file    There is no immunization history on file for this patient.   Objective: Vital Signs: There were no vitals taken for this visit.   Physical Exam   Musculoskeletal Exam: ***  CDAI Exam: CDAI Score: -- Patient Global: --; Provider Global: -- Swollen: --; Tender: -- Joint Exam 03/20/2019   No joint exam has been documented for this visit   There is currently no information documented on the homunculus. Go to the Rheumatology activity and complete the homunculus joint exam.  Investigation: No additional findings.  Imaging: No results found.  Recent Labs: Lab Results  Component Value Date   WBC 5.9 02/20/2019   HGB 16.0 02/20/2019   PLT 305 02/20/2019   NA 139 02/20/2019   K 4.6 02/20/2019   CL 102 02/20/2019   CO2 30 02/20/2019   GLUCOSE 128 (H) 02/20/2019   BUN 15 02/20/2019   CREATININE 1.23 02/20/2019   BILITOT 0.5 02/20/2019   ALKPHOS 55 08/17/2016   AST 13 02/20/2019   ALT 22 02/20/2019   PROT 7.5 02/20/2019   ALBUMIN 4.4 08/17/2016   CALCIUM 10.1 02/20/2019   GFRAA 85 02/20/2019   QFTBGOLD NEGATIVE 10/19/2016   QFTBGOLDPLUS NEGATIVE 10/17/2018    Speciality Comments: Prior therapy: Humira and Enbrel (inadequate response)  Procedures:  No procedures performed Allergies: Dextrose and Methotrexate derivatives   Assessment / Plan:     Visit Diagnoses: No diagnosis found.  Orders:  No orders of the defined types were placed in this encounter.  No orders of the defined types were placed in this encounter.   Face-to-face time spent with patient was *** minutes. Greater than 50% of time was spent in counseling and coordination of care.  Follow-Up Instructions: No follow-ups on file.   Earnestine Mealing, CMA  Note - This record has been created using Editor, commissioning.  Chart creation  errors have been sought, but may not always  have been located. Such creation errors do not reflect on  the standard of medical care.

## 2019-03-15 ENCOUNTER — Ambulatory Visit: Payer: BC Managed Care – PPO | Admitting: Cardiovascular Disease

## 2019-03-20 ENCOUNTER — Ambulatory Visit: Payer: BC Managed Care – PPO | Admitting: Physician Assistant

## 2019-03-26 NOTE — Progress Notes (Signed)
Cardiology Office Note:   Date:  03/27/2019  NAME:  Jacob Massey    MRN: [id number] DOB:  1980-03-21   PCP:  Merri Brunette, MD  Cardiologist:  No primary care provider on file.  Electrophysiologist:  None   Referring MD: Merri Brunette, MD   Chief Complaint  Patient presents with  . Chest Pain    History of Present Illness:   Jacob Massey is a 39 y.o. male with a hx of psoriatic arthritis who is being seen today for the evaluation of chest pain at the request of Merri Brunette, MD.  He reports 3 weeks ago he had an episode of chest tightness in his left chest while driving.  He reports associated symptoms of numbness in his left arm.  He also reports a throbbing pain in his neck.  He reports he felt he was going have to pull over but did not.  He reports the pain resolved after 10 minutes.  There was no intervention to stop the pain.  He reports that his symptoms resolved completely and he was evaluated by his primary care physician who then referred him here.  He reports over the weekend he did have an episode of left-sided soreness in his chest.  Described as dull and achy worse with movement of his left arm.  He does report it is a little achy today and tender to palpation.  EKG today demonstrates normal sinus rhythm heart rate 64 with no acute ischemic changes and no evidence of prior infarction.  He reports he was evaluated by cardiology about 2 years ago, Dr. Donnie Aho.  He reports he underwent a stress echocardiogram which was normal.  I do not have the results of that but apparently he was told by Dr. Donnie Aho he had an enlarged aorta.  He does have a history of acid reflux.  He is not take medications for it.  He reports that he modifies this with diet.  He does have psoriatic arthritis which is controlled on medication.  He is a former smoker, no diabetes.  No prior history of heart disease.  He does report that his grandparents had heart attacks.  Laboratory data from primary care  physician office show total cholesterol 206, triglycerides 221, HDL 46, LDL 116, hemoglobin 15.1, creatinine 1.2  Past Medical History: Past Medical History:  Diagnosis Date  . ADHD 12/03/2015  . Bicipital tendinitis 12/03/2015  . Genital herpes 12/03/2015  . GERD (gastroesophageal reflux disease) 12/03/2015  . Hypertension 12/03/2015  . Psoriasis 12/03/2015  . Psoriatic arthritis (HCC) 12/03/2015  . Sinusitis   . Tendonitis     Past Surgical History: Past Surgical History:  Procedure Laterality Date  . VASECTOMY      Current Medications: Current Meds  Medication Sig  . COSENTYX SENSOREADY, 300 MG, 150 MG/ML SOAJ INJECT 150 MG (1 PEN) INTO THE SKIN EVERY 14 (FOURTEEN) DAYS.  Marland Kitchen guanFACINE (TENEX) 2 MG tablet Take 2 mg by mouth every morning.  Marland Kitchen losartan (COZAAR) 50 MG tablet Take by mouth daily.  . meloxicam (MOBIC) 15 MG tablet Take 1 tablet (15 mg total) by mouth daily.  Marland Kitchen VYVANSE 40 MG capsule Take 40 mg by mouth every morning.     Allergies:    Dextrose and Methotrexate derivatives   Social History: Social History   Socioeconomic History  . Marital status: Married    Spouse name: 3  . Number of children: Not on file  . Years of education: Not on file  . Highest  education level: Not on file  Occupational History  . Not on file  Tobacco Use  . Smoking status: Former Smoker    Packs/day: 1.00    Years: 5.00    Pack years: 5.00  . Smokeless tobacco: Never Used  Substance and Sexual Activity  . Alcohol use: Yes    Comment: 6 beers per week  . Drug use: No  . Sexual activity: Not on file  Other Topics Concern  . Not on file  Social History Narrative  . Not on file   Social Determinants of Health   Financial Resource Strain:   . Difficulty of Paying Living Expenses: Not on file  Food Insecurity:   . Worried About Programme researcher, broadcasting/film/video in the Last Year: Not on file  . Ran Out of Food in the Last Year: Not on file  Transportation Needs:   . Lack of  Transportation (Medical): Not on file  . Lack of Transportation (Non-Medical): Not on file  Physical Activity:   . Days of Exercise per Week: Not on file  . Minutes of Exercise per Session: Not on file  Stress:   . Feeling of Stress : Not on file  Social Connections:   . Frequency of Communication with Friends and Family: Not on file  . Frequency of Social Gatherings with Friends and Family: Not on file  . Attends Religious Services: Not on file  . Active Member of Clubs or Organizations: Not on file  . Attends Banker Meetings: Not on file  . Marital Status: Not on file     Family History: The patient's family history includes ADD / ADHD in his daughter; Arrhythmia in his mother; Arthritis in his mother; Cancer in his father; Diabetes in his mother; Heart attack in his father and maternal grandfather.  ROS:   All other ROS reviewed and negative. Pertinent positives noted in the HPI.     EKGs/Labs/Other Studies Reviewed:   The following studies were personally reviewed by me today:  EKG:  EKG is ordered today.  The ekg ordered today demonstrates normal sinus rhythm, rate 64, no acute ST-T changes, no evidence of heart, and was personally reviewed by me.   Recent Labs: 02/20/2019: ALT 22; BUN 15; Creat 1.23; Hemoglobin 16.0; Platelets 305; Potassium 4.6; Sodium 139   Recent Lipid Panel No results found for: CHOL, TRIG, HDL, CHOLHDL, VLDL, LDLCALC, LDLDIRECT  Physical Exam:   VS:  BP (!) 142/82   Pulse 64   Temp 98.4 F (36.9 C)   Ht 6' 0.5" (1.842 m)   Wt 231 lb 12.8 oz (105.1 kg)   SpO2 98%   BMI 31.01 kg/m    Wt Readings from Last 3 Encounters:  03/27/19 231 lb 12.8 oz (105.1 kg)  10/17/18 228 lb (103.4 kg)  06/29/18 221 lb (100.2 kg)    General: Well nourished, well developed, in no acute distress Heart: Atraumatic, normal size  Eyes: PEERLA, EOMI  Neck: Supple, no JVD Endocrine: No thryomegaly Cardiac: Normal S1, S2; RRR; no murmurs, rubs, or  gallops Lungs: Clear to auscultation bilaterally, no wheezing, rhonchi or rales  Abd: Soft, nontender, no hepatomegaly  Ext: No edema, pulses 2+ Musculoskeletal: No deformities, BUE and BLE strength normal and equal Skin: Warm and dry, no rashes   Neuro: Alert and oriented to person, place, time, and situation, CNII-XII grossly intact, no focal deficits  Psych: Normal mood and affect   ASSESSMENT:   Zachary Nole is a 39 y.o. male  who presents for the following: 1. Chest pain, unspecified type   2. Gastroesophageal reflux disease without esophagitis   3. Renovascular hypertension     PLAN:   1. Chest pain, unspecified type -Atypical chest pain.  CVD risk factors include former smoking, obesity, hypertension.  EKG today without acute ischemic changes no evidence of prior infarction.  I do not have the results of his recent stress test but we will obtain those.  I think it is best to proceed with a coronary CTA to exclude effective CAD as etiology here.  He is highly concerned of the possibility of heart disease given his family history.  He does have risk factors as detailed above.  He will take 100 mg of metoprolol tartrate to our for the scan.  He will also obtain a BMP 1 week before the scan.  We will plan to see him back in 2 months after that in the office.  I will also go ahead and treat him for acid reflux in case this is the problem here.  I also informed him this could be a muscle strain.  2. Gastroesophageal reflux disease without esophagitis -Prilosec 20 mg daily  3.  Hypertension -Continue current blood pressure medications.  Disposition: Return in about 2 months (around 05/25/2019).  Medication Adjustments/Labs and Tests Ordered: Current medicines are reviewed at length with the patient today.  Concerns regarding medicines are outlined above.  Orders Placed This Encounter  Procedures  . CT CORONARY MORPH W/CTA COR W/SCORE W/CA W/CM &/OR WO/CM  . CT CORONARY FRACTIONAL  FLOW RESERVE DATA PREP  . CT CORONARY FRACTIONAL FLOW RESERVE FLUID ANALYSIS  . Basic metabolic panel  . EKG 12-Lead   Meds ordered this encounter  Medications  . metoprolol tartrate (LOPRESSOR) 50 MG tablet    Sig: Take 2 tablets (100mg ) by mouth once for procedure.    Dispense:  2 tablet    Refill:  0  . omeprazole (PRILOSEC) 20 MG capsule    Sig: Take 1 capsule (20 mg total) by mouth daily.    Dispense:  90 capsule    Refill:  3    Patient Instructions  Medication Instructions:  Start Prilosec 20 mg daily Take Metoprolol 100 mg 2 hours before CT when scheduled *If you need a refill on your cardiac medications before your next appointment, please call your pharmacy*  Lab Work: BMET one week before CT when scheduled If you have labs (blood work) drawn today and your tests are completely normal, you will receive your results only by: MyChart Message (if you have MyChart) OR . A paper copy in the mail If you have any lab test that is abnormal or we need to change your treatment, we will call you to review the results.  Testing/Procedures: Your physician has requested that you have cardiac CT. Cardiac computed tomography (CT) is a painless test that uses an x-ray machine to take clear, detailed pictures of your heart. For further information please visit Marland Kitchen. Please follow instruction sheet as given.   Follow-Up: At [facility], you and your health needs are our priority.  As part of our continuing mission to provide you with exceptional heart care, we have created designated Provider Care Teams.  These Care Teams include your primary Cardiologist (physician) and Advanced Practice Providers (APPs -  Physician Assistants and Nurse Practitioners) who all work together to provide you with the care you need, when you need it.  Your next appointment:   2 month(s)  The format for your next appointment:   In Person  Provider:   Eleonore Chiquito, MD  Other  Instructions Your cardiac CT will be scheduled at one of the below locations:   [facility] [address] [place], [place] [address] [phone]  If scheduled at [facility], please arrive at the [facility] main entrance of [facility] 30 minutes prior to test start time.  Proceed to the [facility] Radiology Department (first floor) to check-in and test prep. Please follow these instructions carefully (unless otherwise directed):  Hold all erectile dysfunction medications at least 3 days (72 hrs) prior to test.  On the Night Before the Test: . Be sure to Drink plenty of water. . Do not consume any caffeinated/decaffeinated beverages or chocolate 12 hours prior to your test. . Do not take any antihistamines 12 hours prior to your test. . If you take Metformin do not take 24 hours prior to test.  On the Day of the Test: . Drink plenty of water. Do not drink any water within one hour of the test. . Do not eat any food 4 hours prior to the test. . You may take your regular medications prior to the test.  . Take metoprolol (Lopressor) two hours prior to test. . HOLD Furosemide/Hydrochlorothiazide morning of the test. . FEMALES- please wear underwire-free bra if available       After the Test: . Drink plenty of water. . After receiving IV contrast, you may experience a mild flushed feeling. This is normal. . On occasion, you may experience a mild rash up to 24 hours after the test. This is not dangerous. If this occurs, you can take Benadryl 25 mg and increase your fluid intake. . If you experience trouble breathing, this can be serious. If it is severe call 911 IMMEDIATELY. If it is mild, please call our office. . If you take any of these medications: Glipizide/Metformin, Avandament, Glucavance, please do not take 48 hours after completing test unless otherwise instructed.   Once we have confirmed authorization from your insurance company, we will  call you to set up a date and time for your test.   For non-scheduling related questions, please contact the cardiac imaging nurse navigator should you have any questions/concerns: Marchia Bond, RN Navigator Cardiac Imaging [facility] [phone] mobile       Signed, Addison Naegeli. Audie Box, [place]  [address] [place], [place] [address] [phone]  03/27/2019 3:15 PM

## 2019-03-27 ENCOUNTER — Other Ambulatory Visit: Payer: Self-pay

## 2019-03-27 ENCOUNTER — Ambulatory Visit: Payer: BC Managed Care – PPO | Admitting: Cardiovascular Disease

## 2019-03-27 ENCOUNTER — Encounter: Payer: Self-pay | Admitting: Cardiovascular Disease

## 2019-03-27 VITALS — BP 142/82 | HR 64 | Temp 98.4°F | Ht 72.5 in | Wt 231.8 lb

## 2019-03-27 DIAGNOSIS — I150 Renovascular hypertension: Secondary | ICD-10-CM | POA: Diagnosis not present

## 2019-03-27 DIAGNOSIS — R079 Chest pain, unspecified: Secondary | ICD-10-CM | POA: Diagnosis not present

## 2019-03-27 DIAGNOSIS — K219 Gastro-esophageal reflux disease without esophagitis: Secondary | ICD-10-CM | POA: Diagnosis not present

## 2019-03-27 MED ORDER — OMEPRAZOLE 20 MG PO CPDR: 20.0000 mg | DELAYED_RELEASE_CAPSULE | Freq: Every day | ORAL | 3 refills | Status: DC

## 2019-03-27 MED ORDER — METOPROLOL TARTRATE 50 MG PO TABS: ORAL_TABLET | ORAL | 0 refills | Status: DC

## 2019-03-27 MED FILL — OMEPRAZOLE 20 MG CAP: 20 | 30 days supply | Qty: 30 | Fill #0

## 2019-03-27 MED FILL — METOPROLOL TARTRATE 50 MG T: 50 | 1 days supply | Qty: 2 | Fill #0

## 2019-03-27 NOTE — Patient Instructions (Addendum)
Medication Instructions:  Start Prilosec 20 mg daily Take Metoprolol 100 mg 2 hours before CT when scheduled *If you need a refill on your cardiac medications before your next appointment, please call your pharmacy*  Lab Work: BMET one week before CT when scheduled If you have labs (blood work) drawn today and your tests are completely normal, you will receive your results only by: Marland Kitchen MyChart Message (if you have MyChart) OR . A paper copy in the mail If you have any lab test that is abnormal or we need to change your treatment, we will call you to review the results.  Testing/Procedures: Your physician has requested that you have cardiac CT. Cardiac computed tomography (CT) is a painless test that uses an x-ray machine to take clear, detailed pictures of your heart. For further information please visit [email or web address] Please follow instruction sheet as given.   Follow-Up: At [facility], you and your health needs are our priority.  As part of our continuing mission to provide you with exceptional heart care, we have created designated Provider Care Teams.  These Care Teams include your primary Cardiologist (physician) and Advanced Practice Providers (APPs -  Physician Assistants and Nurse Practitioners) who all work together to provide you with the care you need, when you need it.  Your next appointment:   2 month(s)  The format for your next appointment:   In Person  Provider:   Lennie Odor, MD  Other Instructions Your cardiac CT will be scheduled at one of the below locations:   [facility] [address] [place], [place] [address] [phone]  If scheduled at [facility], please arrive at the [facility] main entrance of [facility] 30 minutes prior to test start time.  Proceed to the [facility] Radiology Department (first floor) to check-in and test prep. Please follow these instructions carefully (unless otherwise directed):  Hold  all erectile dysfunction medications at least 3 days (72 hrs) prior to test.  On the Night Before the Test: . Be sure to Drink plenty of water. . Do not consume any caffeinated/decaffeinated beverages or chocolate 12 hours prior to your test. . Do not take any antihistamines 12 hours prior to your test. . If you take Metformin do not take 24 hours prior to test.  On the Day of the Test: . Drink plenty of water. Do not drink any water within one hour of the test. . Do not eat any food 4 hours prior to the test. . You may take your regular medications prior to the test.  . Take metoprolol (Lopressor) two hours prior to test. . HOLD Furosemide/Hydrochlorothiazide morning of the test. . FEMALES- please wear underwire-free bra if available       After the Test: . Drink plenty of water. . After receiving IV contrast, you may experience a mild flushed feeling. This is normal. . On occasion, you may experience a mild rash up to 24 hours after the test. This is not dangerous. If this occurs, you can take Benadryl 25 mg and increase your fluid intake. . If you experience trouble breathing, this can be serious. If it is severe call 911 IMMEDIATELY. If it is mild, please call our office. . If you take any of these medications: Glipizide/Metformin, Avandament, Glucavance, please do not take 48 hours after completing test unless otherwise instructed.   Once we have confirmed authorization from your insurance company, we will call you to set up a date and time  for your test.   For non-scheduling related questions, please contact the cardiac imaging nurse navigator should you have any questions/concerns: Marchia Bond, RN Navigator Cardiac Imaging [facility] [phone] mobile

## 2019-03-28 ENCOUNTER — Telehealth: Payer: Self-pay | Admitting: Rheumatology

## 2019-03-28 NOTE — Telephone Encounter (Signed)
Patient left a voicemail requesting prescription refill of Cosentyx.   

## 2019-03-28 NOTE — Telephone Encounter (Signed)
Patient advised a 90 day supply has been sent to the pharmacy on 02/27/18. Patient provided pharmacy number to contact pharmacy and set up shipment.

## 2019-03-29 MED FILL — COSENTYX 300 MG DOSE-2 PENS: 150 | 28 days supply | Qty: 2 | Fill #1

## 2019-04-17 NOTE — Progress Notes (Signed)
Office Visit Note  Patient: Jacob Massey             Date of Birth: [birth year]           MRN: [id number]             PCP: Deland Pretty, MD Referring: Deland Pretty, MD Visit Date: 04/24/2019 Occupation: @GUAROCC @  Subjective:  Pain in both hands   History of Present Illness: Jacob Massey is a 39 y.o. male with history of psoriatic arthritis.  Patient is on Cosentyx 150 mg of days injections every 14 days.  He has not missed any doses of Cosentyx recently.  He continues to have severe pain in both hands.  He has occasional nocturnal pain in both hands.  He has tried wearing compression gloves which provides temporary relief.  He is also tried topical agents in the past which were ineffective.  He did not notice any improvement taking meloxicam.  He denies any other joint pain or joint swelling at this time.  He continues to have morning stiffness in his lower back and both SI joints.  He denies any symptoms of radiculopathy.   Activities of Daily Living:  Patient reports morning stiffness for 10 minutes.   Patient Reports nocturnal pain.  Difficulty dressing/grooming: Denies Difficulty climbing stairs: Denies Difficulty getting out of chair: Denies Difficulty using hands for taps, buttons, cutlery, and/or writing: Reports  Review of Systems  Constitutional: Positive for fatigue. Negative for night sweats.  HENT: Positive for mouth dryness. Negative for mouth sores and nose dryness.   Eyes: Negative for redness, itching and dryness.  Respiratory: Negative for shortness of breath and difficulty breathing.   Cardiovascular: Negative for palpitations, hypertension, irregular heartbeat and swelling in legs/feet.  Gastrointestinal: Negative for blood in stool, constipation and diarrhea.  Endocrine: Negative for increased urination.  Genitourinary: Negative for difficulty urinating and painful urination.  Musculoskeletal: Positive for arthralgias, joint pain, joint swelling and  morning stiffness. Negative for myalgias, muscle weakness, muscle tenderness and myalgias.  Skin: Negative for color change, rash, hair loss, nodules/bumps, redness, skin tightness, ulcers and sensitivity to sunlight.  Allergic/Immunologic: Negative for susceptible to infections.  Neurological: Negative for dizziness, fainting, numbness, headaches, memory loss, night sweats and weakness.  Hematological: Negative for bruising/bleeding tendency and swollen glands.  Psychiatric/Behavioral: Negative for depressed mood, confusion and sleep disturbance. The patient is not nervous/anxious.     PMFS History:  Patient Active Problem List   Diagnosis Date Noted  . High risk medication use 06/22/2016  . History of gastroesophageal reflux (GERD) 03/12/2016  . History of hypertension 03/12/2016  . 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 Psoriasis 12/03/2015  . Psoriatic arthritis ([place]) 12/03/2015  . Bicipital tendinitis 12/03/2015  . Hypertension 12/03/2015  . GERD (gastroesophageal reflux disease) 12/03/2015  . ADHD 12/03/2015  . Genital herpes 12/03/2015  . Depression 12/03/2015  . Noncompliance 12/03/2015  . Needle phobia 12/03/2015    Past Medical History:  Diagnosis Date  . ADHD 12/03/2015  . Bicipital tendinitis 12/03/2015  . Genital herpes 12/03/2015  . GERD (gastroesophageal reflux disease) 12/03/2015  . Hypertension 12/03/2015  . Psoriasis 12/03/2015  . Psoriatic arthritis ([place]) 12/03/2015  . Sinusitis   . Tendonitis     Family History  Problem Relation Age of Onset  . Arthritis Mother   . Diabetes Mother        borderline   . Arrhythmia Mother   . Heart attack Father   . Cancer Father   .  ADD / ADHD Daughter   . Heart attack Maternal Grandfather    Past Surgical History:  Procedure Laterality Date  . VASECTOMY     Social History   Social History Narrative  . Not on file    There is no immunization history on file for this  patient.   Objective: Vital Signs: BP (!) 138/93 (BP Location: Left Arm, Patient Position: Sitting, Cuff Size: Normal)   Pulse 71   Resp 15   Ht 6' (1.829 m)   Wt 231 lb (104.8 kg)   BMI 31.33 kg/m    Physical Exam Vitals and nursing note reviewed.  Constitutional:      Appearance: He is well-developed.  HENT:     Head: Normocephalic and atraumatic.  Eyes:     Conjunctiva/sclera: Conjunctivae normal.     Pupils: Pupils are equal, round, and reactive to light.  Pulmonary:     Effort: Pulmonary effort is normal.  Abdominal:     General: Bowel sounds are normal.     Palpations: Abdomen is soft.  Musculoskeletal:     Cervical back: Normal range of motion and neck supple.  Skin:    General: Skin is warm and dry.     Capillary Refill: Capillary refill takes less than 2 seconds.  Neurological:     Mental Status: He is alert and oriented to person, place, and time.  Psychiatric:        Behavior: Behavior normal.      Musculoskeletal Exam: C-spine, thoracic spine, lumbar spine good range of motion.  He has tenderness over bilateral SI joints.  Midline spinal tenderness in the lumbar region.  Shoulder joints, elbow joints, wrist joints have good range of motion with no discomfort or inflammation.  He has tenderness of all PIP joints.  Hip joints have good range of motion with no discomfort.  Knee joints have good range of motion no warmth or effusion.  Ankle joints have good range of motion no tenderness or synovitis  CDAI Exam: CDAI Score: -- Patient Global: --; Provider Global: -- Swollen: 0 ; Tender: 8  Joint Exam 04/24/2019      Right  Left  PIP 2   Tender   Tender  PIP 3   Tender   Tender  PIP 4   Tender   Tender  PIP 5   Tender   Tender     Investigation: No additional findings.  Imaging: No results found.  Recent Labs: Lab Results  Component Value Date   WBC 5.9 02/20/2019   HGB 16.0 02/20/2019   PLT 305 02/20/2019   NA 139 02/20/2019   K 4.6 02/20/2019    CL 102 02/20/2019   CO2 30 02/20/2019   GLUCOSE 128 (H) 02/20/2019   BUN 15 02/20/2019   CREATININE 1.23 02/20/2019   BILITOT 0.5 02/20/2019   ALKPHOS 55 08/17/2016   AST 13 02/20/2019   ALT 22 02/20/2019   PROT 7.5 02/20/2019   ALBUMIN 4.4 08/17/2016   CALCIUM 10.1 02/20/2019   GFRAA 85 02/20/2019   QFTBGOLD NEGATIVE 10/19/2016   QFTBGOLDPLUS NEGATIVE 10/17/2018    Speciality Comments: Prior therapy: Humira and Enbrel (inadequate response)  Procedures:  No procedures performed Allergies: Dextrose and Methotrexate derivatives   Assessment / Plan:     Visit Diagnoses: Psoriatic arthritis (HCC) - Ultrasound of the right hand performed on 03/29/2018 negative for synovitis: He has no synovitis on exam.  He has chronic, severe pain in both hands.  He has tenderness of  the second through fifth PIP joints bilaterally on exam.  He is able to make a complete fist bilaterally.  He has been experiencing severe pain and difficulties with ADLs.  X-rays of both hands were reviewed from 03/22/2017.  Ultrasound results were also reviewed from 03/29/2018.  He has tried taking meloxicam without any relief.  He has also tried using compression gloves but did not notice any improvement.  We will refer him to Dr. Alvester Morin for a nerve conduction study.  He has no other joint pain or joint swelling at this time.  He has no Achilles tendinitis or plantar fasciitis.  He has tenderness over bilateral SI joints.  A future order for sed rate was placed today.  He will continue on Cosentyx 150 mg sq injections every 14 days.  He was advised to notify us if he develops increased joint pain or joint swelling.  He will follow up in 6 months.   Psoriasis: He has no psoriasis at this time.  High risk medication use - Cosentyx 150 mg sq injections once every 14 days.  CBC and CMP were within normal limits on 03/13/2019.  TB gold negative on 10/17/2018.  He will return for lab work in May and every 3 months.  Pain in both  hands - He has chronic, severe pain in both hands.  He has tenderness of the second through fifth PIP joints on exam.  No synovitis or dactylitis was noted.  He has tried using topical agents without any relief.  He is also tried taking meloxicam but did not notice any improvement.  He has been wearing compression gloves which provide temporary relief.  We discussed starting him on gabapentin at bedtime, but he has an allergy to dextrose.  [employer], Teacher, music, discussed this reaction with the patient.  We will start him on low dose gabapentin 100 mg at bedtime and if he tolerates it he can increase to 300 mg at bedtime.  He was advised to avoid taking gabapentin if he has to drive. We will also refer him to Dr. Alvester Morin for a nerve conduction study. Plan: Ambulatory referral to Physical Medicine Rehab  DDD (degenerative disc disease), thoracic: No midline spinal tenderness.  Spondylosis of lumbar region without myelopathy or radiculopathy: He experiences discomfort and stiffness in his lower back first thing in the morning.  He has midline spinal tenderness in lumbar region and over bilateral SI joints.  Other medical conditions are listed as follows:  History of depression  History of ADHD  History of hypertension  History of gastroesophageal reflux (GERD)  Needle phobia    Orders: Orders Placed This Encounter  Procedures  . Sedimentation rate  . Ambulatory referral to Physical Medicine Rehab   No orders of the defined types were placed in this encounter.   Face-to-face time spent with patient was 30 minutes. Greater than 50% of time was spent in counseling and coordination of care.  Follow-Up Instructions: Return in about 3 months (around 07/25/2019) for Psoriatic arthritis.   Sherron Ales, PA-C  I examined and evaluated the patient with Sherron Ales PA.  Patient had no synovitis on examination.  He had tenderness on palpation over his PIPs.  We will schedule nerve  conduction velocities to look for carpal tunnel syndrome.  We we also discussed gabapentin as a trial.  He was in agreement.  We will call in gabapentin 300 mg p.o. nightly.  Side effects were discussed at length.  He has been advised not to  drive after taking gabapentin.  The plan of care was discussed as noted above.  Pollyann Savoy, MD  Note - This record has been created using Animal nutrition[employer].  Chart creation errors have been sought, but may not always  have been located. Such creation errors do not reflect on  the standard of medical care.

## 2019-04-24 ENCOUNTER — Encounter: Payer: Self-pay | Admitting: Physician Assistant

## 2019-04-24 ENCOUNTER — Other Ambulatory Visit: Payer: Self-pay

## 2019-04-24 ENCOUNTER — Ambulatory Visit: Payer: BC Managed Care – PPO | Admitting: Physician Assistant

## 2019-04-24 VITALS — BP 138/93 | HR 71 | Resp 15 | Ht 72.0 in | Wt 231.0 lb

## 2019-04-24 DIAGNOSIS — F40298 Other specified phobia: Secondary | ICD-10-CM

## 2019-04-24 DIAGNOSIS — Z8679 Personal history of other diseases of the circulatory system: Secondary | ICD-10-CM

## 2019-04-24 DIAGNOSIS — M5134 Other intervertebral disc degeneration, thoracic region: Secondary | ICD-10-CM | POA: Diagnosis not present

## 2019-04-24 DIAGNOSIS — L409 Psoriasis, unspecified: Secondary | ICD-10-CM | POA: Diagnosis not present

## 2019-04-24 DIAGNOSIS — Z79899 Other long term (current) drug therapy: Secondary | ICD-10-CM | POA: Diagnosis not present

## 2019-04-24 DIAGNOSIS — L4050 Arthropathic psoriasis, unspecified: Secondary | ICD-10-CM

## 2019-04-24 DIAGNOSIS — M79641 Pain in right hand: Secondary | ICD-10-CM

## 2019-04-24 DIAGNOSIS — M79642 Pain in left hand: Secondary | ICD-10-CM

## 2019-04-24 DIAGNOSIS — Z8719 Personal history of other diseases of the digestive system: Secondary | ICD-10-CM

## 2019-04-24 DIAGNOSIS — M47816 Spondylosis without myelopathy or radiculopathy, lumbar region: Secondary | ICD-10-CM

## 2019-04-24 DIAGNOSIS — Z8659 Personal history of other mental and behavioral disorders: Secondary | ICD-10-CM

## 2019-04-24 MED ORDER — GABAPENTIN 100 MG PO CAPS: ORAL_CAPSULE | ORAL | 0 refills | Status: DC

## 2019-04-24 NOTE — Addendum Note (Signed)
Addended by: Ellen Henri on: 04/24/2019 02:44 PM   Modules accepted: Orders

## 2019-04-24 NOTE — Patient Instructions (Signed)
Standing Labs We placed an order today for your standing lab work.    Please come back and get your standing labs in May every 3 months   We have open lab daily Monday through Thursday from 8:30-12:30 PM and 1:30-4:30 PM and Friday from 8:30-12:30 PM and 1:30-4:00 PM at the office of Dr. Pollyann Savoy.   You may experience shorter wait times on Monday and Friday afternoons. The office is located at [address], [address], [place], [place] [address] No appointment is necessary.   Labs are drawn by [facility].  You may receive a bill from [facility] for your lab work.  If you wish to have your labs drawn at another location, please call the office 24 hours in advance to send orders.  If you have any questions regarding directions or hours of operation,  please call [phone].   Just as a reminder please drink plenty of water prior to coming for your lab work. Thanks!

## 2019-04-27 MED FILL — COSENTYX 300 MG DOSE-2 PENS: 150 | 28 days supply | Qty: 2 | Fill #2

## 2019-05-04 ENCOUNTER — Ambulatory Visit (HOSPITAL_COMMUNITY): Payer: BC Managed Care – PPO

## 2019-05-05 ENCOUNTER — Ambulatory Visit (HOSPITAL_COMMUNITY)
Admission: RE | Admit: 2019-05-05 | Discharge: 2019-05-05 | Disposition: A | Discharge status: Home / Self Care | Payer: BC Managed Care – PPO | Source: Ambulatory Visit | Attending: Cardiovascular Disease | Admitting: Cardiovascular Disease

## 2019-05-05 ENCOUNTER — Other Ambulatory Visit: Payer: Self-pay

## 2019-05-05 DIAGNOSIS — R079 Chest pain, unspecified: Secondary | ICD-10-CM | POA: Diagnosis present

## 2019-05-05 MED ORDER — METOPROLOL TARTRATE 5 MG/5ML IV SOLN
INTRAVENOUS | Status: AC
  Filled 2019-05-05: qty 15

## 2019-05-05 MED ORDER — METOPROLOL TARTRATE 5 MG/5ML IV SOLN: 5.0000 mg | INTRAVENOUS | Status: DC | PRN

## 2019-05-05 MED ORDER — IOHEXOL 350 MG/ML SOLN
80.0000 mL | Freq: Once | INTRAVENOUS | Status: AC | PRN
  Administered 2019-05-05: 80 mL via INTRAVENOUS

## 2019-05-05 MED ORDER — NITROGLYCERIN 0.4 MG SL SUBL
SUBLINGUAL_TABLET | SUBLINGUAL | Status: AC
  Filled 2019-05-05: qty 2

## 2019-05-05 MED ORDER — NITROGLYCERIN 0.4 MG SL SUBL
0.8000 mg | SUBLINGUAL_TABLET | Freq: Once | SUBLINGUAL | Status: AC
  Administered 2019-05-05: 0.8 mg via SUBLINGUAL

## 2019-05-19 ENCOUNTER — Other Ambulatory Visit: Payer: Self-pay

## 2019-05-19 ENCOUNTER — Ambulatory Visit (INDEPENDENT_AMBULATORY_CARE_PROVIDER_SITE_OTHER): Payer: BC Managed Care – PPO | Admitting: Physical Medicine and Rehabilitation

## 2019-05-19 ENCOUNTER — Encounter: Payer: Self-pay | Admitting: Physical Medicine and Rehabilitation

## 2019-05-19 DIAGNOSIS — R202 Paresthesia of skin: Secondary | ICD-10-CM | POA: Diagnosis not present

## 2019-05-19 NOTE — Progress Notes (Signed)
Pt states pain and numbness in right hand (middle and ring finger) and left hand (middle and ring finger). Pt states symptoms started years ago and has gotten worse over the past year. Pt states wearing compression gloves makes it worse. Nothing helps with symptoms. Right hand dominant. Pt hands washed.  .Numeric Pain Rating Scale and Functional Assessment Average Pain 6   In the last MONTH (on 0-10 scale) has pain interfered with the following?  1. General activity like being  able to carry out your everyday physical activities such as walking, climbing stairs, carrying groceries, or moving a chair?  Rating(7)

## 2019-05-22 ENCOUNTER — Other Ambulatory Visit: Payer: Self-pay | Admitting: Rheumatology

## 2019-05-22 NOTE — Telephone Encounter (Signed)
Last Visit: 04/24/19 Next Visit: 07/24/19 Labs: 03/13/19 WNL TB Gold: 10/17/18 Neg   Current Dose: Cosentyx 150 mg sq injections every 14 days.   Okay to refill per Dr. Corliss Skains

## 2019-05-22 NOTE — Progress Notes (Signed)
Jacob Massey - 39 y.o. male MRN [id number]  Date of birth: 08-23-1980  Office Visit Note: Visit Date: 05/19/2019 PCP: Deland Pretty, MD Referred by: Deland Pretty, MD  Subjective: Chief Complaint  Patient presents with  . Right Hand - Pain, Numbness  . Left Hand - Pain, Numbness   HPI: Jacob Massey is a 39 y.o. male who comes in today At the request of Jacob Sams, PA-C for electrodiagnostic study of both upper limbs.  Patient is right-hand dominant.  He reports 6 out of 10 pain.  He reports pain numbness and tingling in the right hand mostly in the middle and ring fingers as well as the left hand middle and ring fingers.  These reports the symptoms started many years ago.  He does have psoriatic arthritis with multiple joint pain. Patient is on Cosentyx 150 mg of days injections every 14 days.  He also reports some nocturnal complaints.  He has more pain than numbness.  No frank radicular symptoms.  No prior electrodiagnostic studies.  ROS Otherwise per HPI.  Assessment & Plan: Visit Diagnoses:  1. Paresthesia of skin     Plan: Impression: Essentially NORMAL electrodiagnostic study of both upper limbs.  There is no significant electrodiagnostic evidence of nerve entrapment, brachial plexopathy or cervical radiculopathy.    As you know, purely sensory or demyelinating radiculopathies and chemical radiculitis may not be detected with this particular electrodiagnostic study.  Recommendations: 1.  Follow-up with referring physician. 2.  Continue current management of symptoms.  Meds & Orders: No orders of the defined types were placed in this encounter.   Orders Placed This Encounter  Procedures  . NCV with EMG (electromyography)    Follow-up: Return if symptoms worsen or fail to improve, for Jacob Sams, PA-C.   Procedures: No procedures performed  EMG & NCV Findings: All nerve conduction studies (as indicated in the following tables) were within normal limits.  Left  vs. Right side comparison data for the ulnar motor nerve indicates abnormal L-R velocity difference (A Elbow-B Elbow, 18 m/s).  The ulnar sensory nerve indicates abnormal L-R latency difference (1.1 ms).  All remaining left vs. right side differences were within normal limits.    All examined muscles (as indicated in the following table) showed no evidence of electrical instability.    Impression: Essentially NORMAL electrodiagnostic study of both upper limbs.  There is no significant electrodiagnostic evidence of nerve entrapment, brachial plexopathy or cervical radiculopathy.    As you know, purely sensory or demyelinating radiculopathies and chemical radiculitis may not be detected with this particular electrodiagnostic study.  Recommendations: 1.  Follow-up with referring physician. 2.  Continue current management of symptoms.  ___________________________ Laurence Spates FAAPMR Board Certified, American Board of Physical Medicine and Rehabilitation    Nerve Conduction Studies Anti Sensory Summary Table   Stim Site NR Peak (ms) Norm Peak (ms) P-T Amp (V) Norm P-T Amp Site1 Site2 Delta-P (ms) Dist (cm) Vel (m/s) Norm Vel (m/s)  Left Median Acr Palm Anti Sensory (2nd Digit)  30.7C  Wrist    3.6 <3.6 53.1 >10 Wrist Palm 1.8 0.0    Palm    1.8 <2.0 49.3         Right Median Acr Palm Anti Sensory (2nd Digit)  30.6C  Wrist    3.5 <3.6 30.3 >10 Wrist Palm 1.6 0.0    Palm    1.9 <2.0 17.6         Left Radial Anti Sensory (Base 1st Digit)  31.3C  Wrist    3.1 <3.1 22.5  Wrist Base 1st Digit 3.1 0.0    Right Radial Anti Sensory (Base 1st Digit)  30.8C  Wrist    2.0 <3.1 26.0  Wrist Base 1st Digit 2.0 0.0    Left Ulnar Anti Sensory (5th Digit)  31.1C    radial nerve  Wrist    2.0 <3.7 43.2 >15.0 Wrist 5th Digit 2.0 14.0 70 >38  Right Ulnar Anti Sensory (5th Digit)  30.8C  Wrist    3.1 <3.7 27.5 >15.0 Wrist 5th Digit 3.1 14.0 45 >38   Motor Summary Table   Stim Site NR Onset (ms)  Norm Onset (ms) O-P Amp (mV) Norm O-P Amp Site1 Site2 Delta-0 (ms) Dist (cm) Vel (m/s) Norm Vel (m/s)  Left Median Motor (Abd Poll Brev)  31.2C  Wrist    3.4 <4.2 9.0 >5 Elbow Wrist 4.1 23.2 57 >50  Elbow    7.5  7.8         Right Median Motor (Abd Poll Brev)  31C  Wrist    3.6 <4.2 9.4 >5 Elbow Wrist 4.4 23.0 52 >50  Elbow    8.0  2.9         Left Ulnar Motor (Abd Dig Min)  31.3C  Wrist    3.0 <4.2 9.1 >3 B Elbow Wrist 3.9 22.0 56 >53  B Elbow    6.9  8.1  A Elbow B Elbow 1.7 10.0 59 >53  A Elbow    8.6  7.8         Right Ulnar Motor (Abd Dig Min)  31C  Wrist    3.5 <4.2 10.5 >3 B Elbow Wrist 4.0 23.0 58 >53  B Elbow    7.5  9.4  A Elbow B Elbow 1.3 10.0 77 >53  A Elbow    8.8  9.4          EMG   Side Muscle Nerve Root Ins Act Fibs Psw Amp Dur Poly Recrt Int Dennie Bible Comment  Right 1stDorInt Ulnar C8-T1 Nml Nml Nml Nml Nml 0 Nml Nml   Right PronatorTeres Median C6-7 Nml Nml Nml Nml Nml 0 Nml Nml   Right Biceps Musculocut C5-6 Nml Nml Nml Nml Nml 0 Nml Nml   Right Deltoid Axillary C5-6 Nml Nml Nml Nml Nml 0 Nml Nml     Nerve Conduction Studies Anti Sensory Left/Right Comparison   Stim Site L Lat (ms) R Lat (ms) L-R Lat (ms) L Amp (V) R Amp (V) L-R Amp (%) Site1 Site2 L Vel (m/s) R Vel (m/s) L-R Vel (m/s)  Median Acr Palm Anti Sensory (2nd Digit)  30.7C  Wrist 3.6 3.5 0.1 53.1 30.3 42.9 Wrist Palm     Palm 1.8 1.9 0.1 49.3 17.6 64.3       Radial Anti Sensory (Base 1st Digit)  31.3C  Wrist 3.1 2.0 1.1 22.5 26.0 13.5 Wrist Base 1st Digit     Ulnar Anti Sensory (5th Digit)  31.1C    radial nerve  Wrist 2.0 3.1 *1.1 43.2 27.5 36.3 Wrist 5th Digit 70 45 25   Motor Left/Right Comparison   Stim Site L Lat (ms) R Lat (ms) L-R Lat (ms) L Amp (mV) R Amp (mV) L-R Amp (%) Site1 Site2 L Vel (m/s) R Vel (m/s) L-R Vel (m/s)  Median Motor (Abd Poll Brev)  31.2C  Wrist 3.4 3.6 0.2 9.0 9.4 4.3 Elbow Wrist 57 52 5  Elbow 7.5 8.0 0.5 7.8  2.9 62.8       Ulnar Motor (Abd Dig Min)  31.3C    Wrist 3.0 3.5 0.5 9.1 10.5 13.3 B Elbow Wrist 56 58 2  B Elbow 6.9 7.5 0.6 8.1 9.4 13.8 A Elbow B Elbow 59 77 *18  A Elbow 8.6 8.8 0.2 7.8 9.4 17.0          Waveforms:                      Clinical History: No specialty comments available.   He reports that he has quit smoking. He has a 5.00 pack-year smoking history. He has never used smokeless tobacco. No results for input(s): HGBA1C, LABURIC in the last 8760 hours.  Objective:  VS:  HT:    WT:   BMI:     BP:   HR: bpm  TEMP: ( )  RESP:  Physical Exam Musculoskeletal:        General: No tenderness.     Comments: Inspection reveals no atrophy of the bilateral APB or FDI or hand intrinsics. There is no swelling, color changes, allodynia or dystrophic changes. There is 5 out of 5 strength in the bilateral wrist extension, finger abduction and long finger flexion. There is intact sensation to light touch in all dermatomal and peripheral nerve distributions.  There is a negative Tinel's test at the bilateral wrist and elbow. There is a negative Phalen's test bilaterally. There is a negative Hoffmann's test bilaterally.  Skin:    General: Skin is warm and dry.     Findings: No erythema or rash.  Neurological:     General: No focal deficit present.     Mental Status: He is alert and oriented to person, place, and time.     Sensory: No sensory deficit.     Motor: No weakness or abnormal muscle tone.     Coordination: Coordination normal.     Gait: Gait normal.  Psychiatric:        Mood and Affect: Mood normal.        Behavior: Behavior normal.        Thought Content: Thought content normal.     Ortho Exam Imaging: No results found.  Past Medical/Family/Surgical/Social History: Medications & Allergies reviewed per EMR, new medications updated. Patient Active Problem List   Diagnosis Date Noted  . High risk medication use 06/22/2016  . History of gastroesophageal reflux (GERD) 03/12/2016  . History of hypertension  03/12/2016  . 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 Psoriasis 12/03/2015  . Psoriatic arthritis (HCC) 12/03/2015  . Bicipital tendinitis 12/03/2015  . Hypertension 12/03/2015  . GERD (gastroesophageal reflux disease) 12/03/2015  . ADHD 12/03/2015  . Genital herpes 12/03/2015  . Depression 12/03/2015  . Noncompliance 12/03/2015  . Needle phobia 12/03/2015   Past Medical History:  Diagnosis Date  . ADHD 12/03/2015  . Bicipital tendinitis 12/03/2015  . Genital herpes 12/03/2015  . GERD (gastroesophageal reflux disease) 12/03/2015  . Hypertension 12/03/2015  . Psoriasis 12/03/2015  . Psoriatic arthritis (HCC) 12/03/2015  . Sinusitis   . Tendonitis    Family History  Problem Relation Age of Onset  . Arthritis Mother   . Diabetes Mother        borderline   . Arrhythmia Mother   . Heart attack Father   . Cancer Father   . ADD / ADHD Daughter   . Heart attack Maternal Grandfather    Past Surgical History:  Procedure  Laterality Date  . VASECTOMY     Social History   Occupational History  . Not on file  Tobacco Use  . Smoking status: Former Smoker    Packs/day: 1.00    Years: 5.00    Pack years: 5.00  . Smokeless tobacco: Never Used  Vaping Use  . Vaping Use: Never used  Substance and Sexual Activity  . Alcohol use: Yes    Comment: 6 beers per week  . Drug use: No  . Sexual activity: Not on file

## 2019-05-22 NOTE — Procedures (Signed)
EMG & NCV Findings: All nerve conduction studies (as indicated in the following tables) were within normal limits.  Left vs. Right side comparison data for the ulnar motor nerve indicates abnormal L-R velocity difference (A Elbow-B Elbow, 18 m/s).  The ulnar sensory nerve indicates abnormal L-R latency difference (1.1 ms).  All remaining left vs. right side differences were within normal limits.    All examined muscles (as indicated in the following table) showed no evidence of electrical instability.    Impression: Essentially NORMAL electrodiagnostic study of both upper limbs.  There is no significant electrodiagnostic evidence of nerve entrapment, brachial plexopathy or cervical radiculopathy.    As you know, purely sensory or demyelinating radiculopathies and chemical radiculitis may not be detected with this particular electrodiagnostic study.  Recommendations: 1.  Follow-up with referring physician. 2.  Continue current management of symptoms.  ___________________________ Laurence Spates FAAPMR Board Certified, American Board of Physical Medicine and Rehabilitation    Nerve Conduction Studies Anti Sensory Summary Table   Stim Site NR Peak (ms) Norm Peak (ms) P-T Amp (V) Norm P-T Amp Site1 Site2 Delta-P (ms) Dist (cm) Vel (m/s) Norm Vel (m/s)  Left Median Acr Palm Anti Sensory (2nd Digit)  30.7C  Wrist    3.6 <3.6 53.1 >10 Wrist Palm 1.8 0.0    Palm    1.8 <2.0 49.3         Right Median Acr Palm Anti Sensory (2nd Digit)  30.6C  Wrist    3.5 <3.6 30.3 >10 Wrist Palm 1.6 0.0    Palm    1.9 <2.0 17.6         Left Radial Anti Sensory (Base 1st Digit)  31.3C  Wrist    3.1 <3.1 22.5  Wrist Base 1st Digit 3.1 0.0    Right Radial Anti Sensory (Base 1st Digit)  30.8C  Wrist    2.0 <3.1 26.0  Wrist Base 1st Digit 2.0 0.0    Left Ulnar Anti Sensory (5th Digit)  31.1C    radial nerve  Wrist    2.0 <3.7 43.2 >15.0 Wrist 5th Digit 2.0 14.0 70 >38  Right Ulnar Anti Sensory (5th Digit)   30.8C  Wrist    3.1 <3.7 27.5 >15.0 Wrist 5th Digit 3.1 14.0 45 >38   Motor Summary Table   Stim Site NR Onset (ms) Norm Onset (ms) O-P Amp (mV) Norm O-P Amp Site1 Site2 Delta-0 (ms) Dist (cm) Vel (m/s) Norm Vel (m/s)  Left Median Motor (Abd Poll Brev)  31.2C  Wrist    3.4 <4.2 9.0 >5 Elbow Wrist 4.1 23.2 57 >50  Elbow    7.5  7.8         Right Median Motor (Abd Poll Brev)  31C  Wrist    3.6 <4.2 9.4 >5 Elbow Wrist 4.4 23.0 52 >50  Elbow    8.0  2.9         Left Ulnar Motor (Abd Dig Min)  31.3C  Wrist    3.0 <4.2 9.1 >3 B Elbow Wrist 3.9 22.0 56 >53  B Elbow    6.9  8.1  A Elbow B Elbow 1.7 10.0 59 >53  A Elbow    8.6  7.8         Right Ulnar Motor (Abd Dig Min)  31C  Wrist    3.5 <4.2 10.5 >3 B Elbow Wrist 4.0 23.0 58 >53  B Elbow    7.5  9.4  A Elbow B Elbow 1.3 10.0  77 >53  A Elbow    8.8  9.4          EMG   Side Muscle Nerve Root Ins Act Fibs Psw Amp Dur Poly Recrt Int Dennie Bible Comment  Right 1stDorInt Ulnar C8-T1 Nml Nml Nml Nml Nml 0 Nml Nml   Right PronatorTeres Median C6-7 Nml Nml Nml Nml Nml 0 Nml Nml   Right Biceps Musculocut C5-6 Nml Nml Nml Nml Nml 0 Nml Nml   Right Deltoid Axillary C5-6 Nml Nml Nml Nml Nml 0 Nml Nml     Nerve Conduction Studies Anti Sensory Left/Right Comparison   Stim Site L Lat (ms) R Lat (ms) L-R Lat (ms) L Amp (V) R Amp (V) L-R Amp (%) Site1 Site2 L Vel (m/s) R Vel (m/s) L-R Vel (m/s)  Median Acr Palm Anti Sensory (2nd Digit)  30.7C  Wrist 3.6 3.5 0.1 53.1 30.3 42.9 Wrist Palm     Palm 1.8 1.9 0.1 49.3 17.6 64.3       Radial Anti Sensory (Base 1st Digit)  31.3C  Wrist 3.1 2.0 1.1 22.5 26.0 13.5 Wrist Base 1st Digit     Ulnar Anti Sensory (5th Digit)  31.1C    radial nerve  Wrist 2.0 3.1 *1.1 43.2 27.5 36.3 Wrist 5th Digit 70 45 25   Motor Left/Right Comparison   Stim Site L Lat (ms) R Lat (ms) L-R Lat (ms) L Amp (mV) R Amp (mV) L-R Amp (%) Site1 Site2 L Vel (m/s) R Vel (m/s) L-R Vel (m/s)  Median Motor (Abd Poll Brev)  31.2C  Wrist  3.4 3.6 0.2 9.0 9.4 4.3 Elbow Wrist 57 52 5  Elbow 7.5 8.0 0.5 7.8 2.9 62.8       Ulnar Motor (Abd Dig Min)  31.3C  Wrist 3.0 3.5 0.5 9.1 10.5 13.3 B Elbow Wrist 56 58 2  B Elbow 6.9 7.5 0.6 8.1 9.4 13.8 A Elbow B Elbow 59 77 *18  A Elbow 8.6 8.8 0.2 7.8 9.4 17.0          Waveforms:

## 2019-05-25 ENCOUNTER — Ambulatory Visit (INDEPENDENT_AMBULATORY_CARE_PROVIDER_SITE_OTHER): Payer: BC Managed Care – PPO | Admitting: Cardiovascular Disease

## 2019-05-25 ENCOUNTER — Other Ambulatory Visit: Payer: Self-pay

## 2019-05-25 ENCOUNTER — Encounter: Payer: Self-pay | Admitting: Cardiovascular Disease

## 2019-05-25 VITALS — BP 124/76 | HR 93 | Ht 72.0 in | Wt 231.2 lb

## 2019-05-25 DIAGNOSIS — R079 Chest pain, unspecified: Secondary | ICD-10-CM | POA: Diagnosis not present

## 2019-05-25 MED FILL — COSENTYX 300 MG DOSE-2 PENS: 150 | 28 days supply | Qty: 2 | Fill #0

## 2019-05-25 NOTE — Patient Instructions (Signed)
Medication Instructions:  The current medical regimen is effective;  continue present plan and medications.  *If you need a refill on your cardiac medications before your next appointment, please call your pharmacy*    Follow-Up: At [facility], you and your health needs are our priority.  As part of our continuing mission to provide you with exceptional heart care, we have created designated Provider Care Teams.  These Care Teams include your primary Cardiologist (physician) and Advanced Practice Providers (APPs -  Physician Assistants and Nurse Practitioners) who all work together to provide you with the care you need, when you need it.  We recommend signing up for the patient portal called "MyChart".  Sign up information is provided on this After Visit Summary.  MyChart is used to connect with patients for Virtual Visits (Telemedicine).  Patients are able to view lab/test results, encounter notes, upcoming appointments, etc.  Non-urgent messages can be sent to your provider as well.   To learn more about what you can do with MyChart, go to [email or web address].    Your next appointment:   As needed  The format for your next appointment:   In Person  Provider:   [place] O'Neal, MD      

## 2019-05-25 NOTE — Progress Notes (Signed)
Cardiology Office Note:   Date:  05/25/2019  NAME:  Jacob Massey    MRN: [id number] DOB:  [birth year]   PCP:  Merri Brunette, MD  Cardiologist:  No primary care provider on file.   Referring MD: Merri Brunette, MD   Chief Complaint  Patient presents with  . Follow-up    History of Present Illness:   Jacob Massey is a 39 y.o. male with a hx of psoriatic arthritis who presents for follow-up of chest pain. Had atypical chest pain. CCTA normal coronaries.  He reports he still gets intermittent tightness in his left chest.  He does have some left arm numbness as well.  He is waiting seen orthopedics about this.  He is also recently had his acid reflux medication increased by his primary care physician up to 40 mg of Prilosec.  Still can get symptoms do not occur every day but do occur quite often.  Not really associated with exertion or alleviated by rest.  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  I did review his CT scan with him in office which shows no evidence of CAD.  I have a low suspicion for any cardiac etiology.  Past Medical History: Past Medical History:  Diagnosis Date  . ADHD 12/03/2015  . Bicipital tendinitis 12/03/2015  . Genital herpes 12/03/2015  . GERD (gastroesophageal reflux disease) 12/03/2015  . Hypertension 12/03/2015  . Psoriasis 12/03/2015  . Psoriatic arthritis (HCC) 12/03/2015  . Sinusitis   . Tendonitis     Past Surgical History: Past Surgical History:  Procedure Laterality Date  . VASECTOMY      Current Medications: Current Meds  Medication Sig  . COSENTYX SENSOREADY, 300 MG, 150 MG/ML SOAJ INJECT 150 MG (1 PEN) INTO THE SKIN EVERY 14 (FOURTEEN) DAYS.  Marland Kitchen gabapentin (NEURONTIN) 100 MG capsule Take 100mg  po at bedtime for 1 week, then 200mg  po at bedtime for 1 week, then 300mg  po at bedtime.  guanFACINE (TENEX) 2 MG tablet Take 2 mg by mouth every morning.  losartan (COZAAR) 50 MG tablet Take by mouth  daily.  . pantoprazole (PROTONIX) 40 MG tablet Take 40 mg by mouth daily.  VYVANSE 40 MG capsule Take 40 mg by mouth every morning.     Allergies:    Adderall [amphetamine-dextroamphetamine] and Methotrexate derivatives   Social History: Social History   Socioeconomic History  . Marital status: Married    Spouse name: 3  . Number of children: Not on file  . Years of education: Not on file  . Highest education level: Not on file  Occupational History  . Not on file  Tobacco Use  . Smoking status: Former Smoker    Packs/day: 1.00    Years: 5.00    Pack years: 5.00  . Smokeless tobacco: Never Used  Substance and Sexual Activity  . Alcohol use: Yes    Comment: 6 beers per week  . Drug use: No  . Sexual activity: Not on file  Other Topics Concern  . Not on file  Social History Narrative  . Not on file   Social Determinants of Health   Financial Resource Strain:   . Difficulty of Paying Living Expenses:   Food Insecurity:   . Worried About Marland Kitchen in the Last Year:   . Marland Kitchen in the Last Year:   Transportation Needs:   . Marland Kitchen (Medical):   Programme researcher, broadcasting/film/video Lack  of Transportation (Non-Medical):   Physical Activity:   . Days of Exercise per Week:   . Minutes of Exercise per Session:   Stress:   . Feeling of Stress :   Social Connections:   . Frequency of Communication with Friends and Family:   . Frequency of Social Gatherings with Friends and Family:   . Attends Religious Services:   . Active Member of Clubs or Organizations:   . Attends Archivist Meetings:   Marland Kitchen Marital Status:      Family History: The patient's family history includes ADD / ADHD in his daughter; Arrhythmia in his mother; Arthritis in his mother; Cancer in his father; Diabetes in his mother; Heart attack in his father and maternal grandfather.  ROS:   All other ROS reviewed and negative. Pertinent positives noted in the HPI.     EKGs/Labs/Other Studies  Reviewed:   The following studies were personally reviewed by me today:  CCTA 05/05/2019 IMPRESSION: 1. Coronary calcium score of 0.  2. Normal coronary origin with right dominance.  3. No evidence of CAD in the visualized segments.  4. Evaluation of the distal LAD and RCA is incomplete as the field of view was too short on this study.  RECOMMENDATIONS: 1. No evidence of CAD (0%) with limitations noted above in the distal segments. Consider non-atherosclerotic causes of chest pain.  Recent Labs: 02/20/2019: ALT 22; BUN 15; Creat 1.23; Hemoglobin 16.0; Platelets 305; Potassium 4.6; Sodium 139   Recent Lipid Panel No results found for: CHOL, TRIG, HDL, CHOLHDL, VLDL, LDLCALC, LDLDIRECT  Physical Exam:   VS:  BP 124/76   Pulse 93   Ht 6' (1.829 m)   Wt 231 lb 3.2 oz (104.9 kg)   SpO2 96%   BMI 31.36 kg/m    Wt Readings from Last 3 Encounters:  05/25/19 231 lb 3.2 oz (104.9 kg)  04/24/19 231 lb (104.8 kg)  03/27/19 231 lb 12.8 oz (105.1 kg)    General: Well nourished, well developed, in no acute distress Heart: Atraumatic, normal size  Eyes: PEERLA, EOMI  Neck: Supple, no JVD Endocrine: No thryomegaly Cardiac: Normal S1, S2; RRR; no murmurs, rubs, or gallops Lungs: Clear to auscultation bilaterally, no wheezing, rhonchi or rales  Abd: Soft, nontender, no hepatomegaly  Ext: No edema, pulses 2+ Musculoskeletal: No deformities, BUE and BLE strength normal and equal Skin: Warm and dry, no rashes   Neuro: Alert and oriented to person, place, time, and situation, CNII-XII grossly intact, no focal deficits  Psych: Normal mood and affect   ASSESSMENT:   Jacob Massey is a 39 y.o. male who presents for the following: 1. Chest pain, unspecified type     PLAN:   1. Chest pain, unspecified type -Atypical chest pain.  I suspect this is musculoskeletal in nature.  This also could be GERD related.  His recent coronary CTA shows no evidence of CAD.  I think we will see him on  an as-needed basis.  I have encouraged him to take his acid reflux medicine as prescribed.  He also will see an orthopedist about any possible musculoskeletal issue he could be having.  Disposition: Return if symptoms worsen or fail to improve.  Medication Adjustments/Labs and Tests Ordered: Current medicines are reviewed at length with the patient today.  Concerns regarding medicines are outlined above.  No orders of the defined types were placed in this encounter.  No orders of the defined types were placed in this encounter.   Patient  Instructions  Medication Instructions:  The current medical regimen is effective;  continue present plan and medications.  *If you need a refill on your cardiac medications before your next appointment, please call your pharmacy*    Follow-Up: At [facility], you and your health needs are our priority.  As part of our continuing mission to provide you with exceptional heart care, we have created designated Provider Care Teams.  These Care Teams include your primary Cardiologist (physician) and Advanced Practice Providers (APPs -  Physician Assistants and Nurse Practitioners) who all work together to provide you with the care you need, when you need it.  We recommend signing up for the patient portal called "MyChart".  Sign up information is provided on this After Visit Summary.  MyChart is used to connect with patients for Virtual Visits (Telemedicine).  Patients are able to view lab/test results, encounter notes, upcoming appointments, etc.  Non-urgent messages can be sent to your provider as well.   To learn more about what you can do with MyChart, go to [email or web address].    Your next appointment:   As needed  The format for your next appointment:   In Person  Provider:   Lennie Odor, MD        Time Spent with Patient: I have spent a total of 15 minutes with patient reviewing hospital notes, telemetry, EKGs, labs and examining the  patient as well as establishing an assessment and plan that was discussed with the patient.  > 50% of time was spent in direct patient care.  Signed, Lenna Gilford. Flora Lipps, MD [facility]  [address] [place], [place] [address] [phone]  05/25/2019 3:12 PM

## 2019-06-22 MED FILL — COSENTYX 300 MG DOSE-2 PENS: 150 | 28 days supply | Qty: 2 | Fill #1

## 2019-07-10 NOTE — Progress Notes (Deleted)
Office Visit Note  Patient: Jacob Massey             Date of Birth: [birth year]           MRN: [id number]             PCP: Merri Brunette, MD Referring: Merri Brunette, MD Visit Date: 07/24/2019 Occupation: @GUAROCC @  Subjective:  No chief complaint on file.   History of Present Illness: Jacob Massey is a 39 y.o. male ***   Activities of Daily Living:  Patient reports morning stiffness for *** {minute/hour:19697}.   Patient {ACTIONS;DENIES/REPORTS:21021675::"Denies"} nocturnal pain.  Difficulty dressing/grooming: {ACTIONS;DENIES/REPORTS:21021675::"Denies"} Difficulty climbing stairs: {ACTIONS;DENIES/REPORTS:21021675::"Denies"} Difficulty getting out of chair: {ACTIONS;DENIES/REPORTS:21021675::"Denies"} Difficulty using hands for taps, buttons, cutlery, and/or writing: {ACTIONS;DENIES/REPORTS:21021675::"Denies"}  No Rheumatology ROS completed.   PMFS History:  Patient Active Problem List   Diagnosis Date Noted  . High risk medication use 06/22/2016  . History of gastroesophageal reflux (GERD) 03/12/2016  . History of hypertension 03/12/2016  . 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 Psoriasis 12/03/2015  . Psoriatic arthritis (HCC) 12/03/2015  . Bicipital tendinitis 12/03/2015  . Hypertension 12/03/2015  . GERD (gastroesophageal reflux disease) 12/03/2015  . ADHD 12/03/2015  . Genital herpes 12/03/2015  . Depression 12/03/2015  . Noncompliance 12/03/2015  . Needle phobia 12/03/2015    Past Medical History:  Diagnosis Date  . ADHD 12/03/2015  . Bicipital tendinitis 12/03/2015  . Genital herpes 12/03/2015  . GERD (gastroesophageal reflux disease) 12/03/2015  . Hypertension 12/03/2015  . Psoriasis 12/03/2015  . Psoriatic arthritis (HCC) 12/03/2015  . Sinusitis   . Tendonitis     Family History  Problem Relation Age of Onset  . Arthritis Mother   . Diabetes Mother        borderline   . Arrhythmia Mother   . Heart  attack Father   . Cancer Father   . ADD / ADHD Daughter   . Heart attack Maternal Grandfather    Past Surgical History:  Procedure Laterality Date  . VASECTOMY     Social History   Social History Narrative  . Not on file    There is no immunization history on file for this patient.   Objective: Vital Signs: There were no vitals taken for this visit.   Physical Exam   Musculoskeletal Exam: ***  CDAI Exam: CDAI Score: -- Patient Global: --; Provider Global: -- Swollen: --; Tender: -- Joint Exam 07/24/2019   No joint exam has been documented for this visit   There is currently no information documented on the homunculus. Go to the Rheumatology activity and complete the homunculus joint exam.  Investigation: No additional findings.  Imaging: No results found.  Recent Labs: Lab Results  Component Value Date   WBC 5.9 02/20/2019   HGB 16.0 02/20/2019   PLT 305 02/20/2019   NA 139 02/20/2019   K 4.6 02/20/2019   CL 102 02/20/2019   CO2 30 02/20/2019   GLUCOSE 128 (H) 02/20/2019   BUN 15 02/20/2019   CREATININE 1.23 02/20/2019   BILITOT 0.5 02/20/2019   ALKPHOS 55 08/17/2016   AST 13 02/20/2019   ALT 22 02/20/2019   PROT 7.5 02/20/2019   ALBUMIN 4.4 08/17/2016   CALCIUM 10.1 02/20/2019   GFRAA 85 02/20/2019   QFTBGOLD NEGATIVE 10/19/2016   QFTBGOLDPLUS NEGATIVE 10/17/2018    Speciality Comments: Prior therapy: Humira and Enbrel (inadequate response)  Procedures:  No procedures performed Allergies: Adderall [amphetamine-dextroamphetamine] and Methotrexate derivatives   Assessment /  Plan:     Visit Diagnoses: No diagnosis found.  Orders: No orders of the defined types were placed in this encounter.  No orders of the defined types were placed in this encounter.   Face-to-face time spent with patient was *** minutes. Greater than 50% of time was spent in counseling and coordination of care.  Follow-Up Instructions: No follow-ups on  file.   Earnestine Mealing, CMA  Note - This record has been created using Editor, commissioning.  Chart creation errors have been sought, but may not always  have been located. Such creation errors do not reflect on  the standard of medical care.

## 2019-07-24 ENCOUNTER — Ambulatory Visit: Payer: BC Managed Care – PPO | Admitting: Physician Assistant

## 2019-07-24 MED FILL — COSENTYX 300 MG DOSE-2 PENS: 150 | 28 days supply | Qty: 2 | Fill #2

## 2019-08-02 NOTE — Progress Notes (Signed)
Office Visit Note  Patient: Jacob Massey             Date of Birth: 09/14/1980           MRN: [id number]             PCP: Merri Brunette, MD Referring: Merri Brunette, MD Visit Date: 08/14/2019 Occupation: @GUAROCC @  Subjective:  Medication management   History of Present Illness: Derel Mcglasson is a 39 y.o. male with history of psoriatic arthritis and psoriasis.  He states he continues to have some pain and stiffness in his hands.  He also describes some discomfort in his SI joints.  He denies any joint swelling.  He states he is taking a new medication for ADHD which is causing urinary frequency.  He has come off some of the other medications.  He denies any psoriasis lesions.  There is no history of Achilles tendinitis, plantar fasciitis or uveitis.  Activities of Daily Living:  Patient reports morning stiffness for 3 minutes.   Patient Denies nocturnal pain.  Difficulty dressing/grooming: Denies Difficulty climbing stairs: Denies Difficulty getting out of chair: Denies Difficulty using hands for taps, buttons, cutlery, and/or writing: Denies  Review of Systems  Constitutional: Positive for fatigue. Negative for night sweats.  HENT: Positive for mouth dryness. Negative for mouth sores and nose dryness.   Eyes: Negative for redness and dryness.  Respiratory: Negative for shortness of breath and difficulty breathing.   Cardiovascular: Negative for chest pain, palpitations, hypertension, irregular heartbeat and swelling in legs/feet.  Gastrointestinal: Negative for constipation and diarrhea.  Endocrine: Positive for increased urination.       Related to medication  Genitourinary: Negative for decreased urine output.  Musculoskeletal: Positive for arthralgias, joint pain, morning stiffness and muscle tenderness. Negative for joint swelling, myalgias, muscle weakness and myalgias.  Skin: Negative for color change, rash, hair loss, nodules/bumps, skin tightness, ulcers and  sensitivity to sunlight.  Allergic/Immunologic: Negative for susceptible to infections.  Neurological: Negative for dizziness, fainting, numbness, memory loss, night sweats and weakness ( ).  Hematological: Negative for bruising/bleeding tendency and swollen glands.  Psychiatric/Behavioral: Negative for depressed mood and sleep disturbance. The patient is not nervous/anxious.     PMFS History:  Patient Active Problem List   Diagnosis Date Noted  . High risk medication use 06/22/2016  . History of gastroesophageal reflux (GERD) 03/12/2016  . History of hypertension 03/12/2016  . 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 Psoriasis 12/03/2015  . Psoriatic arthritis (HCC) 12/03/2015  . Bicipital tendinitis 12/03/2015  . Hypertension 12/03/2015  . GERD (gastroesophageal reflux disease) 12/03/2015  . ADHD 12/03/2015  . Genital herpes 12/03/2015  . Depression 12/03/2015  . Noncompliance 12/03/2015  . Needle phobia 12/03/2015    Past Medical History:  Diagnosis Date  . ADHD 12/03/2015  . Bicipital tendinitis 12/03/2015  . Genital herpes 12/03/2015  . GERD (gastroesophageal reflux disease) 12/03/2015  . Hypertension 12/03/2015  . Psoriasis 12/03/2015  . Psoriatic arthritis (HCC) 12/03/2015  . Sinusitis   . Tendonitis     Family History  Problem Relation Age of Onset  . Arthritis Mother   . Diabetes Mother        borderline   . Arrhythmia Mother   . Heart attack Father   . Cancer Father   . ADD / ADHD Daughter   . Heart attack Maternal Grandfather    Past Surgical History:  Procedure Laterality Date  . VASECTOMY     Social History  Social History Narrative  . Not on file    There is no immunization history on file for this patient.   Objective: Vital Signs: BP 117/70 (BP Location: Left Arm, Patient Position: Sitting, Cuff Size: Normal)   Pulse (!) 59   Resp 14   Ht 6\' 1"  (1.854 m)   Wt 230 lb 3.2 oz (104.4 kg)   BMI 30.37  kg/m    Physical Exam Vitals and nursing note reviewed.  Constitutional:      Appearance: He is well-developed.  HENT:     Head: Normocephalic and atraumatic.  Eyes:     Conjunctiva/sclera: Conjunctivae normal.     Pupils: Pupils are equal, round, and reactive to light.  Cardiovascular:     Rate and Rhythm: Normal rate and regular rhythm.     Heart sounds: Normal heart sounds.  Pulmonary:     Effort: Pulmonary effort is normal.     Breath sounds: Normal breath sounds.  Abdominal:     General: Bowel sounds are normal.     Palpations: Abdomen is soft.  Musculoskeletal:     Cervical back: Normal range of motion and neck supple.  Skin:    General: Skin is warm and dry.     Capillary Refill: Capillary refill takes less than 2 seconds.  Neurological:     Mental Status: He is alert and oriented to person, place, and time.  Psychiatric:        Behavior: Behavior normal.      Musculoskeletal Exam: C-spine thoracic and lumbar spine with good range of motion.  She had tenderness on palpation of bilateral SI joints.  Shoulder joints, elbow joints, wrist joints, MCPs PIPs and DIPs with good range of motion.  He is thickening of right third and fourth PIP joints with no synovitis.  Hip joints, knee joints, ankles, MTPs and PIPs with good range of motion.  There was no evidence of Achilles tendinitis or plantar fasciitis.  CDAI Exam: CDAI Score: 2.6  Patient Global: 4 mm; Provider Global: 2 mm Swollen: 0 ; Tender: 4  Joint Exam 08/14/2019      Right  Left  PIP 3   Tender     PIP 4   Tender     Sacroiliac   Tender   Tender     Investigation: No additional findings.  Imaging: XR Foot 2 Views Left  Result Date: 08/14/2019 PIP and DIP narrowing was noted.  No MTP, intertarsal, tibiotalar or subtalar joint space narrowing was noted.  No erosive changes were noted.  Small posterior calcaneal spur was noted.  Impression: These findings are consistent with osteoarthritis of the  foot.  XR Foot 2 Views Right  Result Date: 08/14/2019 PIP and DIP narrowing was noted.  No MTP, intertarsal, tibiotalar or subtalar joint space narrowing was noted.  No erosive changes were noted.  Small posterior calcaneal spur was noted.  Impression: These findings are consistent with osteoarthritis of the foot.  XR Hand 2 View Left  Result Date: 08/14/2019 PIP and DIP narrowing was noted.  No MCP, intercarpal or radiocarpal joint space narrowing was noted.  No erosive changes were noted.  No radiographic progression was noted when compared to the films of 2019. 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  XR Hand 2 View Right  Result Date: 08/14/2019 PIP and DIP narrowing was noted.  No MCP, intercarpal or radiocarpal joint space narrowing was noted.  No erosive changes were noted.  No radiographic  progression was noted when compared to the films of 2019. 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   Recent Labs: Lab Results  Component Value Date   WBC 5.9 02/20/2019   HGB 16.0 02/20/2019   PLT 305 02/20/2019   NA 139 02/20/2019   K 4.6 02/20/2019   CL 102 02/20/2019   CO2 30 02/20/2019   GLUCOSE 128 (H) 02/20/2019   BUN 15 02/20/2019   CREATININE 1.23 02/20/2019   BILITOT 0.5 02/20/2019   ALKPHOS 55 08/17/2016   AST 13 02/20/2019   ALT 22 02/20/2019   PROT 7.5 02/20/2019   ALBUMIN 4.4 08/17/2016   CALCIUM 10.1 02/20/2019   GFRAA 85 02/20/2019   QFTBGOLD NEGATIVE 10/19/2016   QFTBGOLDPLUS NEGATIVE 10/17/2018    Speciality Comments: Prior therapy: Humira and Enbrel (inadequate response) MTX- fatigue  Procedures:  No procedures performed Allergies: Adderall [amphetamine-dextroamphetamine] and Methotrexate derivatives   Assessment / Plan:     Visit Diagnoses: Psoriatic arthritis (HCC) - Ultrasound of the right hand performed on 03/29/2018 negative for synovitis -he  complains of ongoing discomfort in his hands and feet.  He had no synovitis on examination.  He has some thickening of the PIP joints.  He also has some underlying osteoarthritis.  The x-rays obtained today were reviewed with the patient.  He had no radiographic progression compared to the previous x-rays of his hands.  I will also obtain a sed rate.  He will continue on Cosentyx.  Plan: XR Hand 2 View Right, XR Hand 2 View Left, XR Foot 2 Views Right, XR Foot 2 Views Left, Sedimentation rate  Psoriasis-he had no active psoriasis lesions.  High risk medication use - Cosentyx 150 mg sq injections once every 14 days.  -His labs are past due.  He states he is unable to come for labs on a regular basis due to his [employer] schedule.  Need for having labs on a regular basis was emphasized.  He was advised to get labs every 3 months to monitor for drug toxicity.  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  Spondylosis of lumbar region without myelopathy or radiculopathy-he has pain off and on.  He also had some tenderness over SI joints.  History of hypertension-his blood pressure is well controlled.  History of ADHD-followed by his PCP.  History of gastroesophageal reflux (GERD)  Needle phobia  History of depression  Orders: Orders Placed This Encounter  Procedures  . XR Hand 2 View Right  . XR Hand 2 View Left  . XR Foot 2 Views Right  . XR Foot 2 Views Left  . CBC with Differential/Platelet  . COMPLETE METABOLIC PANEL WITH GFR  . CBC with Differential/Platelet  . COMPLETE METABOLIC PANEL WITH GFR  . QuantiFERON-TB Gold Plus  . Sedimentation rate   Meds ordered this encounter  Medications  . Secukinumab, 300 MG Dose, (COSENTYX SENSOREADY, 300 MG,) 150 MG/ML SOAJ    Sig: INJECT 150 MG  (1 PEN) INTO THE SKIN EVERY 14 (FOURTEEN) DAYS.    Dispense:  6 mL    Refill:  0     Follow-Up Instructions: Return for Psoriatic arthritis.   Pollyann Savoy, MD  Note - This record has been created using Animal nutritionist.  Chart creation errors have been sought, but may not always  have been located. Such  creation errors do not reflect on  the standard of medical care.

## 2019-08-14 ENCOUNTER — Ambulatory Visit: Payer: Self-pay

## 2019-08-14 ENCOUNTER — Other Ambulatory Visit: Payer: Self-pay

## 2019-08-14 ENCOUNTER — Ambulatory Visit: Payer: BC Managed Care – PPO | Admitting: Rheumatology

## 2019-08-14 ENCOUNTER — Encounter: Payer: Self-pay | Admitting: Physician Assistant

## 2019-08-14 VITALS — BP 117/70 | HR 59 | Resp 14 | Ht 73.0 in | Wt 230.2 lb

## 2019-08-14 DIAGNOSIS — L4050 Arthropathic psoriasis, unspecified: Secondary | ICD-10-CM | POA: Diagnosis not present

## 2019-08-14 DIAGNOSIS — L409 Psoriasis, unspecified: Secondary | ICD-10-CM

## 2019-08-14 DIAGNOSIS — Z8719 Personal history of other diseases of the digestive system: Secondary | ICD-10-CM

## 2019-08-14 DIAGNOSIS — M5134 Other intervertebral disc degeneration, thoracic region: Secondary | ICD-10-CM

## 2019-08-14 DIAGNOSIS — F40298 Other specified phobia: Secondary | ICD-10-CM

## 2019-08-14 DIAGNOSIS — Z8659 Personal history of other mental and behavioral disorders: Secondary | ICD-10-CM

## 2019-08-14 DIAGNOSIS — M47816 Spondylosis without myelopathy or radiculopathy, lumbar region: Secondary | ICD-10-CM

## 2019-08-14 DIAGNOSIS — M79642 Pain in left hand: Secondary | ICD-10-CM

## 2019-08-14 DIAGNOSIS — M79641 Pain in right hand: Secondary | ICD-10-CM

## 2019-08-14 DIAGNOSIS — Z79899 Other long term (current) drug therapy: Secondary | ICD-10-CM | POA: Diagnosis not present

## 2019-08-14 DIAGNOSIS — M79672 Pain in left foot: Secondary | ICD-10-CM | POA: Diagnosis not present

## 2019-08-14 DIAGNOSIS — M79671 Pain in right foot: Secondary | ICD-10-CM

## 2019-08-14 DIAGNOSIS — Z8679 Personal history of other diseases of the circulatory system: Secondary | ICD-10-CM

## 2019-08-14 MED ORDER — COSENTYX SENSOREADY (300 MG) 150 MG/ML ~~LOC~~ SOAJ: SUBCUTANEOUS | 0 refills | Status: DC

## 2019-08-14 NOTE — Patient Instructions (Signed)
Standing Labs We placed an order today for your standing lab work.   Please have your standing labs drawn in October and every 3 months  If possible, please have your labs drawn 2 weeks prior to your appointment so that the provider can discuss your results at your appointment.  We have open lab daily Monday through Thursday from 8:30-12:30 PM and 1:30-4:30 PM and Friday from 8:30-12:30 PM and 1:30-4:00 PM at the office of Dr. Saragrace Selke, [facility].   Please be advised, patients with office appointments requiring lab work will take precedents over walk-in lab work.  If possible, please come for your lab work on Monday and Friday afternoons, as you may experience shorter wait times. The office is located at [address], [address], [place], [place] [address] No appointment is necessary.   Labs are drawn by [facility]. Please bring your co-pay at the time of your lab draw.  You may receive a bill from [facility] for your lab work.  If you wish to have your labs drawn at another location, please call the office 24 hours in advance to send orders.  If you have any questions regarding directions or hours of operation,  please call [phone].   As a reminder, please drink plenty of water prior to coming for your lab work. Thanks!  

## 2019-08-15 NOTE — Progress Notes (Signed)
CBC and CMP normal.  Sed rate is 2 and does not indicate active disease.

## 2019-08-16 LAB — COMPLETE METABOLIC PANEL WITH GFR
AG Ratio: 1.8 (calc) (ref 1.0–2.5)
ALT: 34 U/L (ref 9–46)
AST: 17 U/L (ref 10–40)
Albumin: 4.6 g/dL (ref 3.6–5.1)
Alkaline phosphatase (APISO): 64 U/L (ref 36–130)
BUN: 15 mg/dL (ref 7–25)
CO2: 27 mmol/L (ref 20–32)
Calcium: 9.5 mg/dL (ref 8.6–10.3)
Chloride: 104 mmol/L (ref 98–110)
Creat: 1.24 mg/dL (ref 0.60–1.35)
GFR, Est African American: 83 mL/min/{1.73_m2} (ref >=60)
GFR, Est Non African American: 72 mL/min/{1.73_m2} (ref >=60)
Globulin: 2.5 g/dL (calc) (ref 1.9–3.7)
Glucose, Bld: 99 mg/dL (ref 65–99)
Potassium: 4.8 mmol/L (ref 3.5–5.3)
Sodium: 137 mmol/L (ref 135–146)
Total Bilirubin: 0.4 mg/dL (ref 0.2–1.2)
Total Protein: 7.1 g/dL (ref 6.1–8.1)

## 2019-08-16 LAB — CBC WITH DIFFERENTIAL/PLATELET
Absolute Monocytes: 615 cells/uL (ref 200–950)
Basophils Absolute: 30 cells/uL (ref 0–200)
Basophils Relative: 0.7 %
Eosinophils Absolute: 202 cells/uL (ref 15–500)
Eosinophils Relative: 4.7 %
HCT: 45.6 % (ref 38.5–50.0)
Hemoglobin: 15.7 g/dL (ref 13.2–17.1)
Lymphs Abs: 1587 cells/uL (ref 850–3900)
MCH: 30.8 pg (ref 27.0–33.0)
MCHC: 34.4 g/dL (ref 32.0–36.0)
MCV: 89.4 fL (ref 80.0–100.0)
MPV: 9.3 fL (ref 7.5–12.5)
Monocytes Relative: 14.3 %
Neutro Abs: 1866 cells/uL (ref 1500–7800)
Neutrophils Relative %: 43.4 %
Platelets: 293 10*3/uL (ref 140–400)
RBC: 5.1 10*6/uL (ref 4.20–5.80)
RDW: 13.1 % (ref 11.0–15.0)
Total Lymphocyte: 36.9 %
WBC: 4.3 10*3/uL (ref 3.8–10.8)

## 2019-08-16 LAB — SEDIMENTATION RATE: Sed Rate: 2 mm/h (ref 0–15)

## 2019-08-16 LAB — QUANTIFERON-TB GOLD PLUS
Mitogen-NIL: >10 IU/mL
NIL: 0.04 IU/mL
QuantiFERON-TB Gold Plus: NEGATIVE
TB1-NIL: <0 IU/mL
TB2-NIL: 0.01 IU/mL

## 2019-08-16 NOTE — Progress Notes (Signed)
TB Gold is negative.

## 2019-08-21 MED FILL — COSENTYX 300 MG DOSE-2 PENS: 150 | 28 days supply | Qty: 2 | Fill #0

## 2019-09-12 ENCOUNTER — Telehealth: Payer: Self-pay | Admitting: Pharmacy Technician

## 2019-09-12 NOTE — Telephone Encounter (Signed)
Submitted a Prior Authorization request to [facility] for COSENTYX via Fax. Will update once we receive a response.   PA# [id number] Phone# [phone]

## 2019-09-14 NOTE — Telephone Encounter (Signed)
Received notification from [facility] regarding a prior authorization for COSENTYX. Authorization has been APPROVED from 09/13/2019 to 09/12/2020.   Authorization # [id number]   Verlin Fester, PharmD, BCACP, CPP Clinical Specialty Pharmacist (Rheumatology and Pulmonology)  09/14/2019 9:14 AM

## 2019-09-18 MED FILL — COSENTYX 300 MG DOSE-2 PENS: 150 | 28 days supply | Qty: 2 | Fill #1

## 2019-09-20 ENCOUNTER — Telehealth: Payer: Self-pay | Admitting: *Deleted

## 2019-09-20 NOTE — Telephone Encounter (Signed)
Lab Results received from [facility].  Drawn on 09/04/2019 Reviewed by Sherron Ales, PA-C  CBC/CMP WNL  Lipid Panel: Cholesterol 201          Triglycerides 215          Calculated VLDL 43          Total Chol. 5.3

## 2019-10-16 MED FILL — COSENTYX 300 MG DOSE-2 PENS: 150 | 28 days supply | Qty: 2 | Fill #2

## 2019-11-10 ENCOUNTER — Other Ambulatory Visit: Payer: Self-pay | Admitting: Rheumatology

## 2019-11-10 NOTE — Telephone Encounter (Signed)
Last Visit: 08/14/2019 Next Visit: 12/18/2019 Labs: 09/04/2019 CBC/CMP WNL TB Gold: 08/14/2019 Neg    Current Dose per office note 08/14/2019: Cosentyx 150 mg sq injections once every 14 days.  DX: Psoriatic arthritis   Okay to refill per Dr. Corliss Skains

## 2019-11-14 MED FILL — COSENTYX 300 MG DOSE-2 PENS: 150 | 28 days supply | Qty: 2 | Fill #0

## 2019-12-04 NOTE — Progress Notes (Signed)
Office Visit Note  Patient: Jacob Massey             Date of Birth: [birth year]           MRN: [id number]             PCP: Merri Brunette, MD Referring: Merri Brunette, MD Visit Date: 12/05/2019 Occupation: @GUAROCC @  Subjective:  Pain in both hands.   History of Present Illness: Santi Troung is a 39 y.o. male with history of psoriatic arthritis and psoriasis.  He states he has been taking Cosentyx every other week.  He denies any psoriasis lesions.  He states he continues to have pain and stiffness in his hands.  None of the other joints are painful.  He denies any history of plantar fasciitis or Achilles tendinitis.  Activities of Daily Living:  Patient reports morning stiffness for 2 hours.   Patient Denies nocturnal pain.  Difficulty dressing/grooming: Denies Difficulty climbing stairs: Reports Difficulty getting out of chair: Denies Difficulty using hands for taps, buttons, cutlery, and/or writing: Reports  Review of Systems  Constitutional: Positive for fatigue.  HENT: Positive for mouth dryness.   Eyes: Negative for dryness.  Cardiovascular: Negative for swelling in legs/feet.  Gastrointestinal: Positive for constipation.  Endocrine: Positive for cold intolerance, excessive thirst and increased urination.  Genitourinary: Negative for difficulty urinating.  Musculoskeletal: Positive for arthralgias, joint pain, joint swelling, muscle weakness and morning stiffness.  Skin: Negative for rash.  Allergic/Immunologic: Negative for susceptible to infections.  Neurological: Positive for numbness.  Hematological: Negative for bruising/bleeding tendency.  Psychiatric/Behavioral: Positive for sleep disturbance.    PMFS History:  Patient Active Problem List   Diagnosis Date Noted  . High risk medication use 06/22/2016  . History of gastroesophageal reflux (GERD) 03/12/2016  . History of hypertension 03/12/2016  . Spondylosis of lumbar region without myelopathy or  radiculopathy 03/12/2016  . Psoriasis 12/03/2015  . Psoriatic arthritis (HCC) 12/03/2015  . Bicipital tendinitis 12/03/2015  . Hypertension 12/03/2015  . GERD (gastroesophageal reflux disease) 12/03/2015  . ADHD 12/03/2015  . Genital herpes 12/03/2015  . Depression 12/03/2015  . Noncompliance 12/03/2015  . Needle phobia 12/03/2015    Past Medical History:  Diagnosis Date  . ADHD 12/03/2015  . Bicipital tendinitis 12/03/2015  . Genital herpes 12/03/2015  . GERD (gastroesophageal reflux disease) 12/03/2015  . Hypertension 12/03/2015  . Psoriasis 12/03/2015  . Psoriatic arthritis (HCC) 12/03/2015  . Sinusitis   . Tendonitis     Family History  Problem Relation Age of Onset  . Arthritis Mother   . Diabetes Mother        borderline   . Arrhythmia Mother   . Heart attack Father   . Cancer Father   . ADD / ADHD Daughter   . Heart attack Maternal Grandfather    Past Surgical History:  Procedure Laterality Date  . VASECTOMY     Social History   Social History Narrative  . Not on file    There is no immunization history on file for this patient.   Objective: Vital Signs: BP (!) 172/102 (BP Location: Left Arm, Patient Position: Sitting, Cuff Size: Normal)   Pulse (!) 123   Resp 14   Ht 6' 0.5" (1.842 m)   Wt 223 lb (101.2 kg)   BMI 29.83 kg/m    Physical Exam Vitals and nursing note reviewed.  Constitutional:      Appearance: He is well-developed.  HENT:     Head: Normocephalic and atraumatic.  Eyes:  Conjunctiva/sclera: Conjunctivae normal.     Pupils: Pupils are equal, round, and reactive to light.  Cardiovascular:     Rate and Rhythm: Normal rate and regular rhythm.     Heart sounds: Normal heart sounds.  Pulmonary:     Effort: Pulmonary effort is normal.     Breath sounds: Normal breath sounds.  Abdominal:     General: Bowel sounds are normal.     Palpations: Abdomen is soft.  Musculoskeletal:     Cervical back: Normal range of motion and neck  supple.  Skin:    General: Skin is warm and dry.     Capillary Refill: Capillary refill takes less than 2 seconds.  Neurological:     Mental Status: He is alert and oriented to person, place, and time.  Psychiatric:        Behavior: Behavior normal.      Musculoskeletal Exam: C-spine thoracic and lumbar spine were in good range of motion.  He had no SI joint tenderness.  He had right hamstrings.  Shoulder joints, elbow joints, wrist joints were in good range of motion with no synovitis.  He had no synovitis over MCPs PIPs and DIPs.  PIP and DIP thickening was noted.  Hip joints, knee joints, ankles were in good range of motion.  He had no tenderness over MTPs.  CDAI Exam: CDAI Score: -- Patient Global: --; Provider Global: -- Swollen: --; Tender: -- Joint Exam 12/05/2019   No joint exam has been documented for this visit   There is currently no information documented on the homunculus. Go to the Rheumatology activity and complete the homunculus joint exam.  Investigation: No additional findings.  Imaging: No results found.  Recent Labs: Lab Results  Component Value Date   WBC 4.3 08/14/2019   HGB 15.7 08/14/2019   PLT 293 08/14/2019   NA 137 08/14/2019   K 4.8 08/14/2019   CL 104 08/14/2019   CO2 27 08/14/2019   GLUCOSE 99 08/14/2019   BUN 15 08/14/2019   CREATININE 1.24 08/14/2019   BILITOT 0.4 08/14/2019   ALKPHOS 55 08/17/2016   AST 17 08/14/2019   ALT 34 08/14/2019   PROT 7.1 08/14/2019   ALBUMIN 4.4 08/17/2016   CALCIUM 9.5 08/14/2019   GFRAA 83 08/14/2019   QFTBGOLD NEGATIVE 10/19/2016   QFTBGOLDPLUS NEGATIVE 08/14/2019    Speciality Comments: Prior therapy: Humira and Enbrel (inadequate response) MTX- fatigue  Procedures:  No procedures performed Allergies: Adderall [amphetamine-dextroamphetamine] and Methotrexate derivatives   Assessment / Plan:     Visit Diagnoses: Psoriatic arthritis (HCC) - Ultrasound of the right hand performed on 03/29/2018  negative for synovitis.  He continues to complain of discomfort in his bilateral hands.  No synovitis was noted.  Psoriasis-he had no active psoriasis on my examination today.  High risk medication use - Cosentyx 150 mg sq injections once every 14 days.   - Plan: CBC with Differential/Platelet, COMPLETE METABOLIC PANEL WITH GFR today and then every 3 months to monitor for drug toxicity.  TB gold was negative on August 14, 2019.  DDD (degenerative disc disease), thoracic-he has off-and-on discomfort.  Spondylosis of lumbar region without myelopathy or radiculopathy-he had good range of motion of his lumbar spine.  He had intermittent discomfort.  History of hypertension-his blood pressure is very high today.  He states he has been seeing a cardiologist and they are working with his blood pressure.  I have advised him to continue to monitor his blood pressure closely.  Need for regular exercise and heart healthy diet was discussed.  Instructions were placed in the AVS.  History of gastroesophageal reflux (GERD)  History of ADHD - followed by his PCP.  History of depression  Needle phobia  He has not had COVID-19 vaccination.  He is not interested in getting the vaccination.  I have placed the instructions in the AVS.  I also discussed use of mask, social distancing and hand hygiene.  Use of monoclonal antibodies were discussed in case he develops COVID-19 infection.  Orders: Orders Placed This Encounter  Procedures  . CBC with Differential/Platelet  . COMPLETE METABOLIC PANEL WITH GFR   No orders of the defined types were placed in this encounter.     Follow-Up Instructions: Return in about 5 months (around 05/04/2020) for Psoriatic arthritis.   Pollyann Savoy, MD  Note - This record has been created using Animal nutritionist.  Chart creation errors have been sought, but may not always  have been located. Such creation errors do not reflect on  the standard of medical care.

## 2019-12-05 ENCOUNTER — Ambulatory Visit: Payer: BC Managed Care – PPO | Admitting: Rheumatology

## 2019-12-05 ENCOUNTER — Encounter: Payer: Self-pay | Admitting: Rheumatology

## 2019-12-05 ENCOUNTER — Other Ambulatory Visit: Payer: Self-pay

## 2019-12-05 VITALS — BP 172/102 | HR 123 | Resp 14 | Ht 72.5 in | Wt 223.0 lb

## 2019-12-05 DIAGNOSIS — Z79899 Other long term (current) drug therapy: Secondary | ICD-10-CM

## 2019-12-05 DIAGNOSIS — Z8659 Personal history of other mental and behavioral disorders: Secondary | ICD-10-CM

## 2019-12-05 DIAGNOSIS — F40298 Other specified phobia: Secondary | ICD-10-CM

## 2019-12-05 DIAGNOSIS — L409 Psoriasis, unspecified: Secondary | ICD-10-CM

## 2019-12-05 DIAGNOSIS — L4050 Arthropathic psoriasis, unspecified: Secondary | ICD-10-CM

## 2019-12-05 DIAGNOSIS — Z8679 Personal history of other diseases of the circulatory system: Secondary | ICD-10-CM

## 2019-12-05 DIAGNOSIS — Z8719 Personal history of other diseases of the digestive system: Secondary | ICD-10-CM

## 2019-12-05 DIAGNOSIS — M5134 Other intervertebral disc degeneration, thoracic region: Secondary | ICD-10-CM

## 2019-12-05 DIAGNOSIS — M47816 Spondylosis without myelopathy or radiculopathy, lumbar region: Secondary | ICD-10-CM

## 2019-12-05 NOTE — Patient Instructions (Addendum)
COVID-19 vaccine recommendations:   COVID-19 vaccine is recommended for everyone (unless you are allergic to a vaccine component), even if you are on a medication that suppresses your immune system.    Do not take Tylenol or any anti-inflammatory medications (NSAIDs) 24 hours prior to the COVID-19 vaccination.   There is no direct evidence about the efficacy of the COVID-19 vaccine in individuals who are on medications that suppress the immune system.   Even if you are fully vaccinated, and you are on any medications that suppress your immune system, please continue to wear a mask, maintain at least six feet social distance and practice hand hygiene.   If you develop a COVID-19 infection, please contact your PCP or our office to determine if you need monoclonal antibody infusion.  The booster vaccine is now available for immunocompromised patients.   Please see the following web sites for updated information.   [email or web address]   Standing Labs We placed an order today for your standing lab work.   Please have your standing labs drawn in February and every 3 months  If possible, please have your labs drawn 2 weeks prior to your appointment so that the provider can discuss your results at your appointment.  We have open lab daily Monday through Thursday from 8:30-12:30 PM and 1:30-4:30 PM and Friday from 8:30-12:30 PM and 1:30-4:00 PM at the office of Dr. Pollyann Savoy, [facility].   Please be advised, patients with office appointments requiring lab work will take precedents over walk-in lab work.  If possible, please come for your lab work on Monday and Friday afternoons, as you may experience shorter wait times. The office is located at [address], [place], [place] [address] No appointment is necessary.   Labs are drawn by [facility]. Please bring your co-pay at the time of your lab  draw.  You may receive a bill from [facility] for your lab work.  If you wish to have your labs drawn at another location, please call the office 24 hours in advance to send orders.  If you have any questions regarding directions or hours of operation,  please call [phone].   As a reminder, please drink plenty of water prior to coming for your lab work. Thanks!  Heart Disease Prevention   Your inflammatory disease increases your risk of heart disease which includes heart attack, stroke, atrial fibrillation (irregular heartbeats), high blood pressure, heart failure and atherosclerosis (plaque in the arteries).  It is important to reduce your risk by:   Keep blood pressure, cholesterol, and blood sugar at healthy levels   Smoking Cessation   Maintain a healthy weight   BMI 20-25   Eat a healthy diet   Plenty of fresh fruit, vegetables, and whole grains   Limit saturated fats, foods high in sodium, and added sugars   DASH and Mediterranean diet   Increase physical activity   Recommend moderate physically activity for 150 minutes per week/ 30 minutes a day for five days a week These can be broken up into three separate ten-minute sessions during the day.   Reduce Stress   Meditation, slow breathing exercises, yoga, coloring books   Dental visits twice a year

## 2019-12-06 ENCOUNTER — Other Ambulatory Visit: Payer: Self-pay | Admitting: Rheumatology

## 2019-12-06 ENCOUNTER — Other Ambulatory Visit: Payer: Self-pay | Admitting: Physician Assistant

## 2019-12-06 LAB — CBC WITH DIFFERENTIAL/PLATELET
Absolute Monocytes: 415 cells/uL (ref 200–950)
Basophils Absolute: 50 cells/uL (ref 0–200)
Basophils Relative: 1 %
Eosinophils Absolute: 140 cells/uL (ref 15–500)
Eosinophils Relative: 2.8 %
HCT: 48.3 % (ref 38.5–50.0)
Hemoglobin: 16.7 g/dL (ref 13.2–17.1)
Lymphs Abs: 1380 cells/uL (ref 850–3900)
MCH: 30.3 pg (ref 27.0–33.0)
MCHC: 34.6 g/dL (ref 32.0–36.0)
MCV: 87.5 fL (ref 80.0–100.0)
MPV: 9.3 fL (ref 7.5–12.5)
Monocytes Relative: 8.3 %
Neutro Abs: 3015 cells/uL (ref 1500–7800)
Neutrophils Relative %: 60.3 %
Platelets: 335 10*3/uL (ref 140–400)
RBC: 5.52 10*6/uL (ref 4.20–5.80)
RDW: 12.8 % (ref 11.0–15.0)
Total Lymphocyte: 27.6 %
WBC: 5 10*3/uL (ref 3.8–10.8)

## 2019-12-06 LAB — COMPLETE METABOLIC PANEL WITH GFR
AG Ratio: 1.7 (calc) (ref 1.0–2.5)
ALT: 48 U/L — ABNORMAL HIGH (ref 9–46)
AST: 20 U/L (ref 10–40)
Albumin: 5.1 g/dL (ref 3.6–5.1)
Alkaline phosphatase (APISO): 56 U/L (ref 36–130)
BUN: 10 mg/dL (ref 7–25)
CO2: 29 mmol/L (ref 20–32)
Calcium: 10.8 mg/dL — ABNORMAL HIGH (ref 8.6–10.3)
Chloride: 99 mmol/L (ref 98–110)
Creat: 1.09 mg/dL (ref 0.60–1.35)
GFR, Est African American: 97 mL/min/{1.73_m2} (ref >=60)
GFR, Est Non African American: 84 mL/min/{1.73_m2} (ref >=60)
Globulin: 3 g/dL (calc) (ref 1.9–3.7)
Glucose, Bld: 111 mg/dL — ABNORMAL HIGH (ref 65–99)
Potassium: 4.6 mmol/L (ref 3.5–5.3)
Sodium: 136 mmol/L (ref 135–146)
Total Bilirubin: 0.6 mg/dL (ref 0.2–1.2)
Total Protein: 8.1 g/dL (ref 6.1–8.1)

## 2019-12-06 NOTE — Progress Notes (Signed)
CBC is normal.  LFTs are mildly elevated.  Calcium is mildly elevated.  Please advise patient not to take any NSAIDs or drink alcohol.

## 2019-12-06 NOTE — Telephone Encounter (Signed)
Last Visit: 12/06/2019 Next Visit: 05/01/2020 Labs: 12/05/2019 CBC is normal. LFTs are mildly elevated. Calcium is mildly elevated.  TB Gold: 08/14/2019 Neg   Current Dose per office note 12/05/2019:  Cosentyx 150 mg sq injections once every 14 days  DX: Psoriatic arthritis   Okay to refill Cosentyx?

## 2019-12-13 MED FILL — COSENTYX 300 MG DOSE-2 PENS: 150 | 28 days supply | Qty: 2 | Fill #0

## 2019-12-18 ENCOUNTER — Ambulatory Visit: Payer: BC Managed Care – PPO | Admitting: Physician Assistant

## 2020-01-11 MED FILL — COSENTYX 300 MG DOSE-2 PENS: 150 | 28 days supply | Qty: 2 | Fill #1

## 2020-02-08 MED FILL — COSENTYX 300 MG DOSE-2 PENS: 150 | 28 days supply | Qty: 2 | Fill #2

## 2020-03-07 ENCOUNTER — Other Ambulatory Visit: Payer: Self-pay | Admitting: Physician Assistant

## 2020-03-07 NOTE — Telephone Encounter (Signed)
Last Visit: 12/05/2019 Next Visit:05/01/2020 Labs: 12/05/2019 TB Gold: 08/14/2019,negative  Current Dose per office note 12/05/2019,  Cosentyx 150 mg sq injections once every 14 days  DX: Psoriatic arthritis   Okay to refill Cosentyx?

## 2020-03-11 MED FILL — COSENTYX 300 MG DOSE-2 PENS: 150 | 28 days supply | Qty: 2 | Fill #0

## 2020-04-10 MED FILL — COSENTYX 300 MG DOSE-2 PENS: 150 | 28 days supply | Qty: 2 | Fill #1

## 2020-04-19 NOTE — Progress Notes (Signed)
Office Visit Note  Patient: Jacob Massey             Date of Birth: 03/13/1980           MRN: [id number]             PCP: Merri Brunette, MD Referring: Merri Brunette, MD Visit Date: 05/01/2020 Occupation: @GUAROCC @  Subjective:  Medication monitoring   History of Present Illness: Jacob Massey is a 40 y.o. male with history of psoriatic arthritis and osteoarthritis.  He is on cosentyx 150 mg sq injections every 14 days.  According to the patient about 3 weeks ago he had to postpone his Cosentyx dose by 1 week due to having a viral infection.  He would like to have his Covid antibodies checked today.  He denies any other recent infections.  He denies any psoriatic arthritis flares recently.  He has not had any Achilles denies or plantar fasciitis.  He denies any SI joint discomfort.  According to the patient and may 2021 he fell in a hole and injured his left knee.  He states that he was evaluated by Dr. June 2021 who performed a cortisone injection which alleviated his discomfort.  His pain returned in November 2021 so he followed back up with Dr. December 2021 recently and he was diagnosed with patellar tendinitis. He was given a knee brace as well as a topical agent to use twice a day which has alleviated some of his discomfort.  He denies any joint swelling in his knees currently.  He denies any active psoriasis currently.    Activities of Daily Living:  Patient reports morning stiffness for 15 minutes.   Patient Denies nocturnal pain.  Difficulty dressing/grooming: Denies Difficulty climbing stairs: Reports Difficulty getting out of chair: Denies Difficulty using hands for taps, buttons, cutlery, and/or writing: Reports  Review of Systems  Constitutional: Negative for fatigue.  HENT: Positive for mouth dryness and nose dryness. Negative for mouth sores.   Eyes: Positive for itching and dryness. Negative for pain.  Respiratory: Negative for shortness of breath and difficulty breathing.    Cardiovascular: Negative for chest pain and palpitations.  Gastrointestinal: Negative for blood in stool, constipation and diarrhea.  Endocrine: Negative for increased urination.  Genitourinary: Negative for difficulty urinating.  Musculoskeletal: Positive for arthralgias, joint pain, joint swelling and morning stiffness. Negative for myalgias, muscle tenderness and myalgias.  Skin: Negative for color change, rash and redness.  Allergic/Immunologic: Negative for susceptible to infections.  Neurological: Negative for dizziness, numbness, headaches, memory loss and weakness.  Hematological: Negative for bruising/bleeding tendency.  Psychiatric/Behavioral: Negative for confusion.    PMFS History:  Patient Active Problem List   Diagnosis Date Noted  . High risk medication use 06/22/2016  . History of gastroesophageal reflux (GERD) 03/12/2016  . History of hypertension 03/12/2016  . 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 Psoriasis 12/03/2015  . Psoriatic arthritis (HCC) 12/03/2015  . Bicipital tendinitis 12/03/2015  . Hypertension 12/03/2015  . GERD (gastroesophageal reflux disease) 12/03/2015  . ADHD 12/03/2015  . Genital herpes 12/03/2015  . Depression 12/03/2015  . Noncompliance 12/03/2015  . Needle phobia 12/03/2015    Past Medical History:  Diagnosis Date  . ADHD 12/03/2015  . Bicipital tendinitis 12/03/2015  . Genital herpes 12/03/2015  . GERD (gastroesophageal reflux disease) 12/03/2015  . Hypertension 12/03/2015  . Psoriasis 12/03/2015  . Psoriatic arthritis (HCC) 12/03/2015  . Sinusitis   . Tendonitis     Family History  Problem Relation  Age of Onset  . Arthritis Mother   . Diabetes Mother        borderline   . Arrhythmia Mother   . Heart attack Father   . Cancer Father   . ADD / ADHD Daughter   . Heart attack Maternal Grandfather    Past Surgical History:  Procedure Laterality Date  . VASECTOMY     Social History    Social History Narrative  . Not on file    There is no immunization history on file for this patient.   Objective: Vital Signs: BP (!) 163/109 (BP Location: Left Arm, Patient Position: Sitting, Cuff Size: Normal)   Pulse 76   Resp 14   Ht 6' 0.5" (1.842 m)   Wt 232 lb (105.2 kg)   BMI 31.03 kg/m    Physical Exam Vitals and nursing note reviewed.  Constitutional:      Appearance: He is well-developed.  HENT:     Head: Normocephalic and atraumatic.  Eyes:     Conjunctiva/sclera: Conjunctivae normal.     Pupils: Pupils are equal, round, and reactive to light.  Pulmonary:     Effort: Pulmonary effort is normal.  Abdominal:     Palpations: Abdomen is soft.  Musculoskeletal:     Cervical back: Normal range of motion and neck supple.  Skin:    General: Skin is warm and dry.     Capillary Refill: Capillary refill takes less than 2 seconds.  Neurological:     Mental Status: He is alert and oriented to person, place, and time.  Psychiatric:        Behavior: Behavior normal.      Musculoskeletal Exam: C-spine, thoracic spine, and lumbar spine good ROM.  No midline spinal tenderness.  No SI joint tenderness.  Shoulder joints, elbow joints, wrist joints, MCPs, PIPs, and DIPs good ROM with no synovitis.  Mild subluxation of the right second DIP joint.  Hip joints, knee joints, and ankle joints good ROM with no discomfort. No warmth or effusion of knee joints.  Tenderness over the left patellar tendon.  Ankle joints good ROM with no discomfort.  No achilles tendonitis or plantar fascitis.   CDAI Exam: CDAI Score: -- Patient Global: --; Provider Global: -- Swollen: --; Tender: -- Joint Exam 05/01/2020   No joint exam has been documented for this visit   There is currently no information documented on the homunculus. Go to the Rheumatology activity and complete the homunculus joint exam.  Investigation: No additional findings.  Imaging: No results found.  Recent  Labs: Lab Results  Component Value Date   WBC 5.0 12/05/2019   HGB 16.7 12/05/2019   PLT 335 12/05/2019   NA 136 12/05/2019   K 4.6 12/05/2019   CL 99 12/05/2019   CO2 29 12/05/2019   GLUCOSE 111 (H) 12/05/2019   BUN 10 12/05/2019   CREATININE 1.09 12/05/2019   BILITOT 0.6 12/05/2019   ALKPHOS 55 08/17/2016   AST 20 12/05/2019   ALT 48 (H) 12/05/2019   PROT 8.1 12/05/2019   ALBUMIN 4.4 08/17/2016   CALCIUM 10.8 (H) 12/05/2019   GFRAA 97 12/05/2019   QFTBGOLD NEGATIVE 10/19/2016   QFTBGOLDPLUS NEGATIVE 08/14/2019    Speciality Comments: Prior therapy: Humira and Enbrel (inadequate response) MTX- fatigue  Procedures:  No procedures performed Allergies: Adderall [amphetamine-dextroamphetamine] and Methotrexate derivatives   Assessment / Plan:     Visit Diagnoses: Psoriatic arthritis (HCC) - Ultrasound of the right hand performed on 03/29/2018 negative for  synovitis: He has no synovitis or dactylitis on exam.  He has not had any recent psoriatic arthritis flares.  He is clinically doing well on Cosentyx 150 mg sq injections every 14 days.  He is not experiencing any Achilles denies or plantar fasciitis.  He has no SI joint tenderness palpation.  He is currently being treated by Dr. Aundria Rud for left patellar tendinitis following an injury May 2021.  He has been wearing a brace and using a topical agent twice daily which has been alleviating his discomfort.  He continues to have occasional arthralgias and joint stiffness but has not noticed any inflammation.  We discussed the importance of joint protection and muscle strengthening.  He was given a handout of hand exercises to perform since he has noticed decreased grip strength bilaterally.  He will continue on the current regimen.  He does not need any refills of Cosentyx at this time.  He was advised to notify us if he develops increased joint pain or joint swelling.  He will follow-up in the office in 5 months.  - Plan: SARS-CoV-2  Antibody(IgG)Spike,Semi-Quantitative  Psoriasis: He has no active psoriasis at this time.  High risk medication use - Cosentyx 150 mg sq injections once every 14 days.  CBC and CMP updated on 12/05/19.  He is due to update lab work today.  Orders for CBC and CMP placed today.  His next lab work will be due in June and every 3 months to monitor for drug toxicity.  Standing orders for CBC and CMP are in place. TB gold negative on 08/14/19 and will continue to monitor yearly.  Future order for TB gold was placed today.   He had a possible exposure to COVID-19 about 3 weeks ago.  He has not received the COVID-19 vaccinations.  He would like to have Covid antibodies checked today.  - Plan: CBC with Differential/Platelet, COMPLETE METABOLIC PANEL WITH GFR, CBC with Differential/Platelet, COMPLETE METABOLIC PANEL WITH GFR, SARS-CoV-2 Antibody(IgG)Spike,Semi-Quantitative, QuantiFERON-TB Gold Plus  At increased risk of exposure to COVID-19 virus -according to the patient his son and wife were sick about 3 weeks ago.  He is unsure if it was a COVID-19 infection.  He is unvaccinated.  He would like to check for COVID-19 antibodies today.  plan: SARS-CoV-2 Antibody(IgG)Spike,Semi-Quantitative  Screening for tuberculosis -  Future order for TB gold placed today. Plan: QuantiFERON-TB Gold Plus  DDD (degenerative disc disease), thoracic: No midline spinal tenderness.  Spondylosis of lumbar region without myelopathy or radiculopathy: He is not experiencing any lower back pain at this time.  He has no symptoms of radiculopathy.  No midline spinal tenderness.  Other medical conditions are listed as follows:  History of hypertension  History of gastroesophageal reflux (GERD)  History of ADHD  History of depression  Needle phobia   Orders: Orders Placed This Encounter  Procedures  . CBC with Differential/Platelet  . COMPLETE METABOLIC PANEL WITH GFR  . CBC with Differential/Platelet  . COMPLETE METABOLIC  PANEL WITH GFR  . SARS-CoV-2 Antibody(IgG)Spike,Semi-Quantitative  . QuantiFERON-TB Gold Plus   No orders of the defined types were placed in this encounter.    Follow-Up Instructions: Return in about 5 months (around 10/01/2020) for Psoriatic arthritis, Osteoarthritis.   Gearldine Bienenstock, PA-C  Note - This record has been created using Dragon software.  Chart creation errors have been sought, but may not always  have been located. Such creation errors do not reflect on  the standard of medical care.

## 2020-04-30 ENCOUNTER — Other Ambulatory Visit (HOSPITAL_COMMUNITY): Payer: Self-pay

## 2020-05-01 ENCOUNTER — Encounter: Payer: Self-pay | Admitting: Physician Assistant

## 2020-05-01 ENCOUNTER — Other Ambulatory Visit: Payer: Self-pay

## 2020-05-01 ENCOUNTER — Ambulatory Visit: Payer: BC Managed Care – PPO | Admitting: Physician Assistant

## 2020-05-01 VITALS — BP 163/109 | HR 76 | Resp 14 | Ht 72.5 in | Wt 232.0 lb

## 2020-05-01 DIAGNOSIS — Z8679 Personal history of other diseases of the circulatory system: Secondary | ICD-10-CM

## 2020-05-01 DIAGNOSIS — Z111 Encounter for screening for respiratory tuberculosis: Secondary | ICD-10-CM

## 2020-05-01 DIAGNOSIS — L4050 Arthropathic psoriasis, unspecified: Secondary | ICD-10-CM | POA: Diagnosis not present

## 2020-05-01 DIAGNOSIS — M47816 Spondylosis without myelopathy or radiculopathy, lumbar region: Secondary | ICD-10-CM

## 2020-05-01 DIAGNOSIS — M5134 Other intervertebral disc degeneration, thoracic region: Secondary | ICD-10-CM | POA: Diagnosis not present

## 2020-05-01 DIAGNOSIS — Z9189 Other specified personal risk factors, not elsewhere classified: Secondary | ICD-10-CM

## 2020-05-01 DIAGNOSIS — Z8659 Personal history of other mental and behavioral disorders: Secondary | ICD-10-CM

## 2020-05-01 DIAGNOSIS — L409 Psoriasis, unspecified: Secondary | ICD-10-CM | POA: Diagnosis not present

## 2020-05-01 DIAGNOSIS — Z8719 Personal history of other diseases of the digestive system: Secondary | ICD-10-CM

## 2020-05-01 DIAGNOSIS — Z79899 Other long term (current) drug therapy: Secondary | ICD-10-CM | POA: Diagnosis not present

## 2020-05-01 DIAGNOSIS — F40298 Other specified phobia: Secondary | ICD-10-CM

## 2020-05-01 NOTE — Patient Instructions (Signed)
Standing Labs We placed an order today for your standing lab work.   Please have your standing labs drawn in July and every 3 months   If possible, please have your labs drawn 2 weeks prior to your appointment so that the provider can discuss your results at your appointment.  We have open lab daily Monday through Thursday from 1:30-4:30 PM and Friday from 1:30-4:00 PM at the office of Dr. Pollyann Savoy, [facility].   Please be advised, all patients with office appointments requiring lab work will take precedents over walk-in lab work.  If possible, please come for your lab work on Monday and Friday afternoons, as you may experience shorter wait times. The office is located at [address], [place], [place] [address] No appointment is necessary.   Labs are drawn by [facility]. Please bring your co-pay at the time of your lab draw.  You may receive a bill from [facility] for your lab work.  If you wish to have your labs drawn at another location, please call the office 24 hours in advance to send orders.  If you have any questions regarding directions or hours of operation,  please call [phone].   As a reminder, please drink plenty of water prior to coming for your lab work. Thanks!    Hand Exercises Hand exercises can be helpful for almost anyone. These exercises can strengthen the hands, improve flexibility and movement, and increase blood flow to the hands. These results can make work and daily tasks easier. Hand exercises can be especially helpful for people who have joint pain from arthritis or have nerve damage from overuse (carpal tunnel syndrome). These exercises can also help people who have injured a hand. Exercises Most of these hand exercises are gentle stretching and motion exercises. It is usually safe to do them often throughout the day. Warming up your hands before exercise may help to reduce stiffness. You can do this with gentle massage or by  placing your hands in warm water for 10-15 minutes. It is normal to feel some stretching, pulling, tightness, or mild discomfort as you begin new exercises. This will gradually improve. Stop an exercise right away if you feel sudden, severe pain or your pain gets worse. Ask your health care provider which exercises are best for you. Knuckle bend or "claw" fist 1. Stand or sit with your arm, hand, and all five fingers pointed straight up. Make sure to keep your wrist straight during the exercise. 2. Gently bend your fingers down toward your palm until the tips of your fingers are touching the top of your palm. Keep your big knuckle straight and just bend the small knuckles in your fingers. 3. Hold this position for __________ seconds. 4. Straighten (extend) your fingers back to the starting position. Repeat this exercise 5-10 times with each hand. Full finger fist 1. Stand or sit with your arm, hand, and all five fingers pointed straight up. Make sure to keep your wrist straight during the exercise. 2. Gently bend your fingers into your palm until the tips of your fingers are touching the middle of your palm. 3. Hold this position for __________ seconds. 4. Extend your fingers back to the starting position, stretching every joint fully. Repeat this exercise 5-10 times with each hand. Straight fist 1. Stand or sit with your arm, hand, and all five fingers pointed straight up. Make sure to keep your wrist straight during the exercise. 2. Gently bend your fingers at the  big knuckle, where your fingers meet your hand, and the middle knuckle. Keep the knuckle at the tips of your fingers straight and try to touch the bottom of your palm. 3. Hold this position for __________ seconds. 4. Extend your fingers back to the starting position, stretching every joint fully. Repeat this exercise 5-10 times with each hand. Tabletop 1. Stand or sit with your arm, hand, and all five fingers pointed straight up.  Make sure to keep your wrist straight during the exercise. 2. Gently bend your fingers at the big knuckle, where your fingers meet your hand, as far down as you can while keeping the small knuckles in your fingers straight. Think of forming a tabletop with your fingers. 3. Hold this position for __________ seconds. 4. Extend your fingers back to the starting position, stretching every joint fully. Repeat this exercise 5-10 times with each hand. Finger spread 1. Place your hand flat on a table with your palm facing down. Make sure your wrist stays straight as you do this exercise. 2. Spread your fingers and thumb apart from each other as far as you can until you feel a gentle stretch. Hold this position for __________ seconds. 3. Bring your fingers and thumb tight together again. Hold this position for __________ seconds. Repeat this exercise 5-10 times with each hand. Making circles 1. Stand or sit with your arm, hand, and all five fingers pointed straight up. Make sure to keep your wrist straight during the exercise. 2. Make a circle by touching the tip of your thumb to the tip of your index finger. 3. Hold for __________ seconds. Then open your hand wide. 4. Repeat this motion with your thumb and each finger on your hand. Repeat this exercise 5-10 times with each hand. Thumb motion 1. Sit with your forearm resting on a table and your wrist straight. Your thumb should be facing up toward the ceiling. Keep your fingers relaxed as you move your thumb. 2. Lift your thumb up as high as you can toward the ceiling. Hold for __________ seconds. 3. Bend your thumb across your palm as far as you can, reaching the tip of your thumb for the small finger (pinkie) side of your palm. Hold for __________ seconds. Repeat this exercise 5-10 times with each hand. Grip strengthening 1. Hold a stress ball or other soft ball in the middle of your hand. 2. Slowly increase the pressure, squeezing the ball as much  as you can without causing pain. Think of bringing the tips of your fingers into the middle of your palm. All of your finger joints should bend when doing this exercise. 3. Hold your squeeze for __________ seconds, then relax. Repeat this exercise 5-10 times with each hand.   Contact a health care provider if:  Your hand pain or discomfort gets much worse when you do an exercise.  Your hand pain or discomfort does not improve within 2 hours after you exercise. If you have any of these problems, stop doing these exercises right away. Do not do them again unless your health care provider says that you can. Get help right away if:  You develop sudden, severe hand pain or swelling. If this happens, stop doing these exercises right away. Do not do them again unless your health care provider says that you can. This information is not intended to replace advice given to you by your health care provider. Make sure you discuss any questions you have with your health care provider. Document  Revised: 05/12/2018 Document Reviewed: 01/20/2018 Elsevier Patient Education  2021 [employer].

## 2020-05-02 LAB — CBC WITH DIFFERENTIAL/PLATELET
Absolute Monocytes: 525 cells/uL (ref 200–950)
Basophils Absolute: 42 cells/uL (ref 0–200)
Basophils Relative: 0.8 %
Eosinophils Absolute: 192 cells/uL (ref 15–500)
Eosinophils Relative: 3.7 %
HCT: 47.7 % (ref 38.5–50.0)
Hemoglobin: 16.1 g/dL (ref 13.2–17.1)
Lymphs Abs: 1596 cells/uL (ref 850–3900)
MCH: 29.7 pg (ref 27.0–33.0)
MCHC: 33.8 g/dL (ref 32.0–36.0)
MCV: 87.8 fL (ref 80.0–100.0)
MPV: 9.4 fL (ref 7.5–12.5)
Monocytes Relative: 10.1 %
Neutro Abs: 2844 cells/uL (ref 1500–7800)
Neutrophils Relative %: 54.7 %
Platelets: 403 10*3/uL — ABNORMAL HIGH (ref 140–400)
RBC: 5.43 10*6/uL (ref 4.20–5.80)
RDW: 13.1 % (ref 11.0–15.0)
Total Lymphocyte: 30.7 %
WBC: 5.2 10*3/uL (ref 3.8–10.8)

## 2020-05-02 LAB — COMPLETE METABOLIC PANEL WITH GFR
AG Ratio: 1.8 (calc) (ref 1.0–2.5)
ALT: 73 U/L — ABNORMAL HIGH (ref 9–46)
AST: 27 U/L (ref 10–40)
Albumin: 4.6 g/dL (ref 3.6–5.1)
Alkaline phosphatase (APISO): 56 U/L (ref 36–130)
BUN: 12 mg/dL (ref 7–25)
CO2: 31 mmol/L (ref 20–32)
Calcium: 10.1 mg/dL (ref 8.6–10.3)
Chloride: 105 mmol/L (ref 98–110)
Creat: 1.02 mg/dL (ref 0.60–1.35)
GFR, Est African American: 105 mL/min/{1.73_m2} (ref >=60)
GFR, Est Non African American: 91 mL/min/{1.73_m2} (ref >=60)
Globulin: 2.6 g/dL (calc) (ref 1.9–3.7)
Glucose, Bld: 85 mg/dL (ref 65–99)
Potassium: 4.8 mmol/L (ref 3.5–5.3)
Sodium: 140 mmol/L (ref 135–146)
Total Bilirubin: 0.4 mg/dL (ref 0.2–1.2)
Total Protein: 7.2 g/dL (ref 6.1–8.1)

## 2020-05-02 LAB — SARS-COV-2 ANTIBODY(IGG)SPIKE,SEMI-QUANTITATIVE: SARS COV1 AB(IGG)SPIKE,SEMI QN: <1 index (ref <1.00)

## 2020-05-02 NOTE — Progress Notes (Signed)
Platelet is borderline elevated-403.  Rest of CBC WNL.  ALT is elevated and trending up.  AST WNL.  Please clarify if the patient has been taking any tylenol, NSAIDs, or alcohol use.  He should avoid all of these things.  Please forward lab work to PCP.

## 2020-05-02 NOTE — Progress Notes (Signed)
Covid-19 antibody test was negative.

## 2020-05-08 ENCOUNTER — Other Ambulatory Visit (HOSPITAL_COMMUNITY): Payer: Self-pay

## 2020-05-09 ENCOUNTER — Other Ambulatory Visit (HOSPITAL_COMMUNITY): Payer: Self-pay

## 2020-05-09 MED FILL — Secukinumab Subcutaneous Auto-inj 150 MG/ML (300 MG Dose): SUBCUTANEOUS | 28 days supply | Qty: 2 | Fill #0 | Status: AC

## 2020-05-13 ENCOUNTER — Other Ambulatory Visit (HOSPITAL_COMMUNITY): Payer: Self-pay

## 2020-05-15 ENCOUNTER — Other Ambulatory Visit (HOSPITAL_COMMUNITY): Payer: Self-pay

## 2020-06-10 ENCOUNTER — Other Ambulatory Visit (HOSPITAL_COMMUNITY): Payer: Self-pay

## 2020-06-10 ENCOUNTER — Other Ambulatory Visit: Payer: Self-pay | Admitting: Physician Assistant

## 2020-06-10 MED ORDER — COSENTYX SENSOREADY (300 MG) 150 MG/ML ~~LOC~~ SOAJ
SUBCUTANEOUS | 2 refills | Status: DC
  Filled 2020-06-10: qty 2, 28d supply, fill #0
  Filled 2020-07-08: qty 2, 28d supply, fill #1
  Filled 2020-08-02: qty 2, 28d supply, fill #2

## 2020-06-10 NOTE — Telephone Encounter (Signed)
Next Visit: 10/01/2020  Last Visit: 05/01/2020  Last Fill: 03/07/2020  DX: Psoriatic arthritis   Current Dose per office note 05/01/2020, Cosentyx 150 mg sq injections once every 14 days  Labs: 05/01/2020, Platelet is borderline elevated-403. Rest of CBC WNL. ALT is elevated and trending up. AST WNL. Please clarify if the patient has been taking any tylenol, NSAIDs, or alcohol use. He should avoid all of these things.  Please forward lab work to PCP.   TB Gold: 08/13/2020, negative  Okay to refill Cosentyx?

## 2020-06-12 ENCOUNTER — Other Ambulatory Visit (HOSPITAL_COMMUNITY): Payer: Self-pay

## 2020-07-08 ENCOUNTER — Other Ambulatory Visit (HOSPITAL_COMMUNITY): Payer: Self-pay

## 2020-07-10 ENCOUNTER — Other Ambulatory Visit (HOSPITAL_COMMUNITY): Payer: Self-pay

## 2020-07-16 ENCOUNTER — Telehealth: Payer: Self-pay | Admitting: Rheumatology

## 2020-07-16 NOTE — Telephone Encounter (Signed)
Patient wanted to see if he can have a PRP injection in his knee along with medication ( Cosentyx) he is on here? Please call to advise.

## 2020-07-16 NOTE — Telephone Encounter (Signed)
Patient advised there is no contraindication to PRP injections while he is on Cosentyx.

## 2020-07-16 NOTE — Telephone Encounter (Signed)
There is no contraindication to PRP injections while he is on Cosentyx.

## 2020-08-02 ENCOUNTER — Other Ambulatory Visit (HOSPITAL_COMMUNITY): Payer: Self-pay

## 2020-08-07 ENCOUNTER — Other Ambulatory Visit (HOSPITAL_COMMUNITY): Payer: Self-pay

## 2020-08-29 ENCOUNTER — Telehealth: Payer: Self-pay

## 2020-08-29 NOTE — Telephone Encounter (Signed)
Submitted a faxed Prior Authorization request to [facility] for COSENTYX. Will update once we receive a response.   Phone# [phone]

## 2020-08-31 ENCOUNTER — Other Ambulatory Visit: Payer: Self-pay | Admitting: Physician Assistant

## 2020-08-31 ENCOUNTER — Other Ambulatory Visit (HOSPITAL_COMMUNITY): Payer: Self-pay

## 2020-09-02 ENCOUNTER — Other Ambulatory Visit (HOSPITAL_COMMUNITY): Payer: Self-pay

## 2020-09-03 ENCOUNTER — Other Ambulatory Visit: Payer: Self-pay | Admitting: *Deleted

## 2020-09-03 DIAGNOSIS — Z79899 Other long term (current) drug therapy: Secondary | ICD-10-CM

## 2020-09-03 DIAGNOSIS — Z111 Encounter for screening for respiratory tuberculosis: Secondary | ICD-10-CM

## 2020-09-03 NOTE — Telephone Encounter (Signed)
Received notification from [facility] regarding a prior authorization for COSENTYX. Authorization has been APPROVED from 08/30/2020 to 08/30/2021.   Patient can continue to fill through [facility]: [phone]   Authorization # [id number]

## 2020-09-04 ENCOUNTER — Other Ambulatory Visit: Payer: Self-pay | Admitting: *Deleted

## 2020-09-04 ENCOUNTER — Other Ambulatory Visit (HOSPITAL_COMMUNITY): Payer: Self-pay

## 2020-09-04 MED ORDER — COSENTYX SENSOREADY (300 MG) 150 MG/ML ~~LOC~~ SOAJ
SUBCUTANEOUS | 2 refills | Status: DC
  Filled 2020-09-04: qty 2, 28d supply, fill #0
  Filled 2020-09-27: qty 2, 28d supply, fill #1
  Filled 2020-12-03: qty 2, 28d supply, fill #2

## 2020-09-04 NOTE — Progress Notes (Signed)
Glucose is 110.  Rest of CMP WNL.  CBC WNL.

## 2020-09-04 NOTE — Telephone Encounter (Signed)
-----   Message from Gearldine Bienenstock, PA-C sent at 09/04/2020  8:03 AM EDT ----- Glucose is 110. Rest of CMP WNL.  CBC WNL.

## 2020-09-04 NOTE — Telephone Encounter (Signed)
Next Visit: 10/01/2020  Last Visit: 05/01/2020  Last Fill: 06/10/2020  [id number] arthritis  Current Dose per office note 05/01/2020: Cosentyx 150 mg sq injections once every 14 days.   Labs: 09/03/2020 glucose is 110. Rest of CMP WNL.  CBC WNL.   TB Gold: 08/14/2019 Neg ( patient update 09/03/2020, results are pending)   Patient due for next dose of Cosentyx on 09/06/2020.   Okay to refill Cosentyx?

## 2020-09-05 ENCOUNTER — Other Ambulatory Visit (HOSPITAL_COMMUNITY): Payer: Self-pay

## 2020-09-05 LAB — CBC WITH DIFFERENTIAL/PLATELET
Absolute Monocytes: 479 cells/uL (ref 200–950)
Basophils Absolute: 61 cells/uL (ref 0–200)
Basophils Relative: 1.1 %
Eosinophils Absolute: 132 cells/uL (ref 15–500)
Eosinophils Relative: 2.4 %
HCT: 46.3 % (ref 38.5–50.0)
Hemoglobin: 15.6 g/dL (ref 13.2–17.1)
Lymphs Abs: 1931 cells/uL (ref 850–3900)
MCH: 30.3 pg (ref 27.0–33.0)
MCHC: 33.7 g/dL (ref 32.0–36.0)
MCV: 89.9 fL (ref 80.0–100.0)
MPV: 9 fL (ref 7.5–12.5)
Monocytes Relative: 8.7 %
Neutro Abs: 2899 cells/uL (ref 1500–7800)
Neutrophils Relative %: 52.7 %
Platelets: 334 10*3/uL (ref 140–400)
RBC: 5.15 10*6/uL (ref 4.20–5.80)
RDW: 13.2 % (ref 11.0–15.0)
Total Lymphocyte: 35.1 %
WBC: 5.5 10*3/uL (ref 3.8–10.8)

## 2020-09-05 LAB — COMPLETE METABOLIC PANEL WITH GFR
AG Ratio: 1.7 (calc) (ref 1.0–2.5)
ALT: 44 U/L (ref 9–46)
AST: 23 U/L (ref 10–40)
Albumin: 4.9 g/dL (ref 3.6–5.1)
Alkaline phosphatase (APISO): 63 U/L (ref 36–130)
BUN: 10 mg/dL (ref 7–25)
CO2: 29 mmol/L (ref 20–32)
Calcium: 9.9 mg/dL (ref 8.6–10.3)
Chloride: 100 mmol/L (ref 98–110)
Creat: 1.2 mg/dL (ref 0.60–1.29)
Globulin: 2.9 g/dL (calc) (ref 1.9–3.7)
Glucose, Bld: 110 mg/dL — ABNORMAL HIGH (ref 65–99)
Potassium: 4.8 mmol/L (ref 3.5–5.3)
Sodium: 137 mmol/L (ref 135–146)
Total Bilirubin: 0.4 mg/dL (ref 0.2–1.2)
Total Protein: 7.8 g/dL (ref 6.1–8.1)
eGFR: 77 mL/min/{1.73_m2} (ref >=60)

## 2020-09-05 LAB — QUANTIFERON-TB GOLD PLUS
Mitogen-NIL: >10 IU/mL
NIL: 0.02 IU/mL
QuantiFERON-TB Gold Plus: NEGATIVE
TB1-NIL: 0.02 IU/mL
TB2-NIL: 0 IU/mL

## 2020-09-05 NOTE — Progress Notes (Signed)
TB gold negative

## 2020-09-17 NOTE — Progress Notes (Deleted)
Office Visit Note  Patient: Jacob Massey             Date of Birth: [birth year]           MRN: [id number]             PCP: Merri Brunette, MD Referring: Merri Brunette, MD Visit Date: 10/01/2020 Occupation: @GUAROCC @  Subjective:  No chief complaint on file.   History of Present Illness: Jacob Massey is a 40 y.o. male ***   Activities of Daily Living:  Patient reports morning stiffness for *** {minute/hour:19697}.   Patient {ACTIONS;DENIES/REPORTS:21021675::"Denies"} nocturnal pain.  Difficulty dressing/grooming: {ACTIONS;DENIES/REPORTS:21021675::"Denies"} Difficulty climbing stairs: {ACTIONS;DENIES/REPORTS:21021675::"Denies"} Difficulty getting out of chair: {ACTIONS;DENIES/REPORTS:21021675::"Denies"} Difficulty using hands for taps, buttons, cutlery, and/or writing: {ACTIONS;DENIES/REPORTS:21021675::"Denies"}  No Rheumatology ROS completed.   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    Past Medical History:  Diagnosis Date   ADHD 12/03/2015   Bicipital tendinitis 12/03/2015   Genital herpes 12/03/2015   GERD (gastroesophageal reflux disease) 12/03/2015   Hypertension 12/03/2015   Psoriasis 12/03/2015   Psoriatic arthritis (HCC) 12/03/2015   Sinusitis    Tendonitis     Family History  Problem Relation Age of Onset   Arthritis Mother    Diabetes Mother        borderline    Arrhythmia Mother    Heart attack Father    Cancer  Father    ADD / ADHD Daughter    Heart attack Maternal Grandfather    Past Surgical History:  Procedure Laterality Date   VASECTOMY     Social History   Social History Narrative   Not on file    There is no immunization history on file for this patient.   Objective: Vital Signs: There were no vitals taken for this visit.   Physical Exam   Musculoskeletal Exam: ***  CDAI Exam: CDAI Score: -- Patient Global: --; Provider Global: -- Swollen: --; Tender: -- Joint Exam 10/01/2020   No joint exam has been documented for this visit   There is currently no information documented on the homunculus. Go to the Rheumatology activity and complete the homunculus joint exam.  Investigation: No additional findings.  Imaging: No results found.  Recent Labs: Lab Results  Component Value Date   WBC 5.5 09/03/2020   HGB 15.6 09/03/2020   PLT 334 09/03/2020   NA 137 09/03/2020   K 4.8 09/03/2020   CL 100 09/03/2020   CO2 29 09/03/2020   GLUCOSE 110 (H) 09/03/2020   BUN 10 09/03/2020   CREATININE 1.20 09/03/2020   BILITOT 0.4 09/03/2020   ALKPHOS 55 08/17/2016   AST 23 09/03/2020   ALT 44 09/03/2020   PROT 7.8 09/03/2020   ALBUMIN 4.4 08/17/2016   CALCIUM 9.9 09/03/2020   GFRAA 105 05/01/2020   QFTBGOLD NEGATIVE 10/19/2016   QFTBGOLDPLUS NEGATIVE 09/03/2020    Speciality Comments: Prior therapy: Humira and Enbrel (inadequate response) MTX- fatigue  Procedures:  No procedures performed Allergies: Adderall [amphetamine-dextroamphetamine] and Methotrexate derivatives  Assessment / Plan:     Visit Diagnoses: No diagnosis found.  Orders: No orders of the defined types were placed in this encounter.  No orders of the defined types were placed in this encounter.   Face-to-face time spent with patient was *** minutes. Greater than 50% of time was spent in counseling and coordination of care.  Follow-Up Instructions: No follow-ups on file.   Jacob Massey,  CMA  Note - This record has been created using Animal nutritionist.  Chart creation errors have been sought, but may not always  have been located. Such creation errors do not reflect on  the standard of medical care.

## 2020-09-27 ENCOUNTER — Other Ambulatory Visit (HOSPITAL_COMMUNITY): Payer: Self-pay

## 2020-09-27 NOTE — Progress Notes (Signed)
Office Visit Note  Patient: Jacob Massey             Date of Birth: 09/09/1980           MRN: [id number]             PCP: Merri Brunette, MD Referring: Merri Brunette, MD Visit Date: 10/01/2020 Occupation: @GUAROCC @  Subjective:  Medication monitoring   History of Present Illness: Jacob Massey is a 40 y.o. male with history of psoriatic arthritis and DDD.  Patient is prescribed Cosentyx 150 mg sqinjections every 14 days.  His last dose of cosentyx was on 09/22/20.  He is currently holding Cosentyx in anticipation for left knee arthroscopic surgery on 10/28/2020 with Dr. 10/30/2020.  Patient reports that he has a meniscal tear in the left knee and his pain has been about a 5 out of 10 on a daily basis.  He has been using a brace as needed for support.  He states that he is nervous about being off of Cosentyx for a prolonged period of time prior to and after surgery.  He has no active psoriasis at this time.  He denies any Achilles tendinitis or plantar fasciitis.  He experiences some SI joint stiffness bilaterally first thing in the morning which typically resolves within 5 to 10 minutes.  He experiences occasional pain and stiffness in both hands but denies any joint swelling. He denies any recent infections.    Activities of Daily Living:  Patient reports morning stiffness for 5-10 minutes  Patient Denies nocturnal pain.  Difficulty dressing/grooming: Denies Difficulty climbing stairs: Denies Difficulty getting out of chair: Denies Difficulty using hands for taps, buttons, cutlery, and/or writing: Denies  Review of Systems  Constitutional:  Positive for fatigue. Negative for night sweats.  HENT:  Positive for mouth dryness. Negative for mouth sores and nose dryness.   Eyes:  Negative for redness and dryness.  Respiratory:  Negative for cough, shortness of breath and difficulty breathing.   Cardiovascular:  Positive for palpitations. Negative for chest pain, hypertension, irregular  heartbeat and swelling in legs/feet.  Gastrointestinal:  Positive for constipation. Negative for diarrhea.  Endocrine: Negative for increased urination.  Genitourinary:  Negative for painful urination.  Musculoskeletal:  Positive for joint pain, joint pain and morning stiffness. Negative for joint swelling, myalgias, muscle weakness, muscle tenderness and myalgias.  Skin:  Negative for color change, rash, hair loss, nodules/bumps, skin tightness, ulcers and sensitivity to sunlight.  Allergic/Immunologic: Negative for susceptible to infections.  Neurological:  Negative for dizziness, fainting, memory loss, night sweats and weakness.  Hematological:  Negative for swollen glands.  Psychiatric/Behavioral:  Positive for sleep disturbance. Negative for depressed mood. The patient is not nervous/anxious.    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    Past Medical History:  Diagnosis Date   ADHD 12/03/2015   Bicipital tendinitis 12/03/2015   Genital herpes 12/03/2015   GERD (gastroesophageal reflux disease) 12/03/2015   Hypertension 12/03/2015   Psoriasis 12/03/2015   Psoriatic arthritis (  HCC) 12/03/2015   Sinusitis    Tendonitis     Family History  Problem Relation Age of Onset   Arthritis Mother    Diabetes Mother        borderline    Arrhythmia Mother    Heart attack Father    Cancer Father    ADD / ADHD Daughter    Heart attack Maternal Grandfather     Past Surgical History:  Procedure Laterality Date   VASECTOMY     Social History   Social History Narrative   Not on file    There is no immunization history on file for this patient.   Objective: Vital Signs: Ht 6\' 1"  (1.854 m)   BMI 30.61 kg/m    Physical Exam Vitals and nursing note reviewed.  Constitutional:      Appearance: He is well-developed.  HENT:     Head: Normocephalic and atraumatic.  Eyes:     Conjunctiva/sclera: Conjunctivae normal.     Pupils: Pupils are equal, round, and reactive to light.  Pulmonary:     Effort: Pulmonary effort is normal.  Abdominal:     Palpations: Abdomen is soft.  Musculoskeletal:     Cervical back: Normal range of motion and neck supple.  Skin:    General: Skin is warm and dry.     Capillary Refill: Capillary refill takes less than 2 seconds.  Neurological:     Mental Status: He is alert and oriented to person, place, and time.  Psychiatric:        Behavior: Behavior normal.     Musculoskeletal Exam: C-spine, thoracic spine, and lumbar spine have good ROM.  No midline spinal tenderness or SI joint tenderness.  Shoulder joints, elbow joints, wrist joints, MCPs, PIPs, DIPs have good range of motion with no synovitis.  He was able to make a complete fist bilaterally.  Hip joints have good range of motion with no discomfort.  Some painful ROM of the left knee. Medial joint line tenderness of the left knee noted.  No warmth or effusion of knee joints.  Ankle joints have good ROM with no tenderness or joint swelling.  No evidence of achilles tendonitis or plantar fasciitis.   CDAI Exam: CDAI Score: -- Patient Global: --; Provider Global: -- Swollen: --; Tender: -- Joint Exam 10/01/2020   No joint exam has been documented for this visit   There is currently no information documented on the homunculus. Go to the Rheumatology activity and complete the homunculus joint exam.  Investigation: No additional findings.  Imaging: No  results found.  Recent Labs: Lab Results  Component Value Date   WBC 5.5 09/03/2020   HGB 15.6 09/03/2020   PLT 334 09/03/2020   NA 137 09/03/2020   K 4.8 09/03/2020   CL 100 09/03/2020   CO2 29 09/03/2020   GLUCOSE 110 (H) 09/03/2020   BUN 10 09/03/2020   CREATININE 1.20 09/03/2020   BILITOT 0.4 09/03/2020   ALKPHOS 55 08/17/2016   AST 23 09/03/2020   ALT 44 09/03/2020   PROT 7.8 09/03/2020   ALBUMIN 4.4 08/17/2016   CALCIUM 9.9 09/03/2020   GFRAA 105 05/01/2020   QFTBGOLD NEGATIVE 10/19/2016   QFTBGOLDPLUS NEGATIVE 09/03/2020    Speciality Comments: Prior therapy: Humira and Enbrel (inadequate response) MTX- fatigue  Procedures:  No procedures performed Allergies: Adderall [amphetamine-dextroamphetamine] and Methotrexate derivatives   Assessment / Plan:     Visit Diagnoses: Psoriatic arthritis (HCC) - Ultrasound of the right hand performed on  03/29/2018 negative for synovitis: He has no synovitis or dactylitis on exam.  He has not had any recent psoriatic arthritis flares.  He is prescribed Cosentyx 150 mg subcutaneous injections every 14 days.  His most recent Cosentyx dose was administered on 09/22/2020.  He is scheduled for upcoming left knee arthroscopic surgery which will be performed by Dr. Aundria Rud on 10/28/2020.  He is currently holding Cosentyx prior to surgery.  He is aware that he will need Dr. Aundria Rud to clear him after surgery to resume Cosentyx injections.  He has no active psoriasis at this time.  No evidence of Achilles tendinitis or plantar fasciitis was noted.  He experiences occasional SI joint discomfort and stiffness but has no tenderness to palpation on examination today.  He was advised to notify us if he develops signs or symptoms of a flare while off of Cosentyx.  He will return for his routine follow-up in 5 months.  Psoriasis: He has no active psoriasis at this time.  High risk medication use - Cosentyx 150 mg sq injections once every 14 days. CBC and CMP  updated on 09/03/2020.  His next lab work will be due in November and every 3 months to monitor for drug toxicity. LFTs returned to WNL.  Standing orders for CBC and CMP are in place.  TB Gold negative on 09/03/2020. He has not had any recent infections.  Discussed the importance of holding cosentyx if he develops signs or symptoms of an infection and to resume once the infection has completely cleared.   DDD (degenerative disc disease), thoracic: No midline spinal tenderness noted.  Spondylosis of lumbar region without myelopathy or radiculopathy: No midline spinal tenderness.  No symptoms of radiculopathy.  History of meniscal tear: Left knee-He is scheduled for arthroscopic surgery which will be performed by Dr. Aundria Rud on 10/28/2020.  He is currently holding Cosentyx 1 month prior to surgery.  He is aware that he will require clearance by Dr. Aundria Rud to resume Cosentyx during the postoperative period.    Other medical conditions are listed as follows:  History of gastroesophageal reflux (GERD)  History of depression  History of hypertension  History of ADHD  Needle phobia  Orders: No orders of the defined types were placed in this encounter.  No orders of the defined types were placed in this encounter.     Follow-Up Instructions: Return in about 5 months (around 03/03/2021) for Psoriatic arthritis, DDD.   Gearldine Bienenstock, PA-C  Note - This record has been created using Dragon software.  Chart creation errors have been sought, but may not always  have been located. Such creation errors do not reflect on  the standard of medical care.

## 2020-10-01 ENCOUNTER — Other Ambulatory Visit (HOSPITAL_COMMUNITY): Payer: Self-pay

## 2020-10-01 ENCOUNTER — Other Ambulatory Visit: Payer: Self-pay

## 2020-10-01 ENCOUNTER — Ambulatory Visit: Payer: BC Managed Care – PPO | Admitting: Physician Assistant

## 2020-10-01 ENCOUNTER — Ambulatory Visit: Payer: BC Managed Care – PPO | Admitting: Rheumatology

## 2020-10-01 ENCOUNTER — Encounter: Payer: Self-pay | Admitting: Physician Assistant

## 2020-10-01 VITALS — BP 147/99 | HR 112 | Resp 12 | Ht 73.0 in | Wt 229.4 lb

## 2020-10-01 DIAGNOSIS — Z8719 Personal history of other diseases of the digestive system: Secondary | ICD-10-CM

## 2020-10-01 DIAGNOSIS — Z8679 Personal history of other diseases of the circulatory system: Secondary | ICD-10-CM

## 2020-10-01 DIAGNOSIS — M5134 Other intervertebral disc degeneration, thoracic region: Secondary | ICD-10-CM | POA: Diagnosis not present

## 2020-10-01 DIAGNOSIS — F40298 Other specified phobia: Secondary | ICD-10-CM

## 2020-10-01 DIAGNOSIS — Z79899 Other long term (current) drug therapy: Secondary | ICD-10-CM | POA: Diagnosis not present

## 2020-10-01 DIAGNOSIS — Z87828 Personal history of other (healed) physical injury and trauma: Secondary | ICD-10-CM

## 2020-10-01 DIAGNOSIS — L4050 Arthropathic psoriasis, unspecified: Secondary | ICD-10-CM

## 2020-10-01 DIAGNOSIS — M47816 Spondylosis without myelopathy or radiculopathy, lumbar region: Secondary | ICD-10-CM

## 2020-10-01 DIAGNOSIS — L409 Psoriasis, unspecified: Secondary | ICD-10-CM | POA: Diagnosis not present

## 2020-10-01 DIAGNOSIS — Z8659 Personal history of other mental and behavioral disorders: Secondary | ICD-10-CM

## 2020-10-01 NOTE — Patient Instructions (Signed)
Standing Labs We placed an order today for your standing lab work.   Please have your standing labs drawn in November and every 3 months  If possible, please have your labs drawn 2 weeks prior to your appointment so that the provider can discuss your results at your appointment.  Please note that you may see your imaging and lab results in MyChart before we have reviewed them. We may be awaiting multiple results to interpret others before contacting you. Please allow our office up to 72 hours to thoroughly review all of the results before contacting the office for clarification of your results.  We have open lab daily: Monday through Thursday from 1:30-4:30 PM and Friday from 1:30-4:00 PM at the office of Dr. Shaili Deveshwar, [facility].   Please be advised, all patients with office appointments requiring lab work will take precedent over walk-in lab work.  If possible, please come for your lab work on Monday and Friday afternoons, as you may experience shorter wait times. The office is located at [address], [address], [place], [place] [address] No appointment is necessary.   Labs are drawn by [facility]. Please bring your co-pay at the time of your lab draw.  You may receive a bill from [facility] for your lab work.  If you wish to have your labs drawn at another location, please call the office 24 hours in advance to send orders.  If you have any questions regarding directions or hours of operation,  please call [phone].   As a reminder, please drink plenty of water prior to coming for your lab work. Thanks!  

## 2020-10-25 ENCOUNTER — Other Ambulatory Visit (HOSPITAL_COMMUNITY): Payer: Self-pay

## 2020-11-25 ENCOUNTER — Other Ambulatory Visit (HOSPITAL_COMMUNITY): Payer: Self-pay

## 2020-12-03 ENCOUNTER — Other Ambulatory Visit (HOSPITAL_COMMUNITY): Payer: Self-pay

## 2020-12-27 ENCOUNTER — Other Ambulatory Visit (HOSPITAL_COMMUNITY): Payer: Self-pay

## 2020-12-27 ENCOUNTER — Other Ambulatory Visit: Payer: Self-pay | Admitting: Physician Assistant

## 2020-12-30 ENCOUNTER — Other Ambulatory Visit (HOSPITAL_COMMUNITY): Payer: Self-pay

## 2020-12-30 MED ORDER — COSENTYX SENSOREADY (300 MG) 150 MG/ML ~~LOC~~ SOAJ
SUBCUTANEOUS | 0 refills | Status: DC
Start: 2020-12-30 — End: 2021-01-28
  Filled 2020-12-30: qty 2, 28d supply, fill #0

## 2020-12-30 NOTE — Telephone Encounter (Signed)
Next Visit: 03/03/2021  Last Visit: 10/01/2020  Last Fill: 09/04/2020  DX: Psoriatic arthritis   Current Dose per office note 10/01/2020: Cosentyx 150 mg sq injections once every 14 days.  Labs: 09/03/2020 Glucose is 110. Rest of CMP WNL.  CBC WNL.   TB Gold: 09/03/2020 Neg   Patient advised he is due to update labs. Patient will update tomorrow.   Okay to refill Cosentyx?

## 2020-12-31 ENCOUNTER — Other Ambulatory Visit: Payer: Self-pay

## 2020-12-31 DIAGNOSIS — Z79899 Other long term (current) drug therapy: Secondary | ICD-10-CM

## 2021-01-01 ENCOUNTER — Other Ambulatory Visit (HOSPITAL_COMMUNITY): Payer: Self-pay

## 2021-01-01 LAB — CBC WITH DIFFERENTIAL/PLATELET
Absolute Monocytes: 481 cells/uL (ref 200–950)
Basophils Absolute: 49 cells/uL (ref 0–200)
Basophils Relative: 0.9 %
Eosinophils Absolute: 81 cells/uL (ref 15–500)
Eosinophils Relative: 1.5 %
HCT: 44 % (ref 38.5–50.0)
Hemoglobin: 15.1 g/dL (ref 13.2–17.1)
Lymphs Abs: 1993 cells/uL (ref 850–3900)
MCH: 31 pg (ref 27.0–33.0)
MCHC: 34.3 g/dL (ref 32.0–36.0)
MCV: 90.3 fL (ref 80.0–100.0)
MPV: 9.3 fL (ref 7.5–12.5)
Monocytes Relative: 8.9 %
Neutro Abs: 2797 cells/uL (ref 1500–7800)
Neutrophils Relative %: 51.8 %
Platelets: 308 10*3/uL (ref 140–400)
RBC: 4.87 10*6/uL (ref 4.20–5.80)
RDW: 13.1 % (ref 11.0–15.0)
Total Lymphocyte: 36.9 %
WBC: 5.4 10*3/uL (ref 3.8–10.8)

## 2021-01-01 LAB — COMPLETE METABOLIC PANEL WITH GFR
AG Ratio: 1.9 (calc) (ref 1.0–2.5)
ALT: 75 U/L — ABNORMAL HIGH (ref 9–46)
AST: 32 U/L (ref 10–40)
Albumin: 4.9 g/dL (ref 3.6–5.1)
Alkaline phosphatase (APISO): 51 U/L (ref 36–130)
BUN: 9 mg/dL (ref 7–25)
CO2: 27 mmol/L (ref 20–32)
Calcium: 10 mg/dL (ref 8.6–10.3)
Chloride: 101 mmol/L (ref 98–110)
Creat: 1.11 mg/dL (ref 0.60–1.29)
Globulin: 2.6 g/dL (calc) (ref 1.9–3.7)
Glucose, Bld: 111 mg/dL — ABNORMAL HIGH (ref 65–99)
Potassium: 4.4 mmol/L (ref 3.5–5.3)
Sodium: 138 mmol/L (ref 135–146)
Total Bilirubin: 0.6 mg/dL (ref 0.2–1.2)
Total Protein: 7.5 g/dL (ref 6.1–8.1)
eGFR: 85 mL/min/{1.73_m2} (ref >=60)

## 2021-01-01 NOTE — Progress Notes (Signed)
Glucose is 111.  ALT is elevated-75.   AST WNL.  Please clarify if he is taking any tylenol, NSAIDs, or alcohol use.  He should avoid these things and repeat hepatic function panel in 1 month.  CBC WNL.

## 2021-01-02 ENCOUNTER — Telehealth: Payer: Self-pay | Admitting: *Deleted

## 2021-01-02 DIAGNOSIS — Z79899 Other long term (current) drug therapy: Secondary | ICD-10-CM

## 2021-01-02 NOTE — Telephone Encounter (Signed)
-----   Message from Gearldine Bienenstock, PA-C sent at 01/01/2021 12:02 PM EST ----- Glucose is 111.  ALT is elevated-75.   AST WNL.  Please clarify if he is taking any tylenol, NSAIDs, or alcohol use.  He should avoid these things and repeat hepatic function panel in 1 month.  CBC WNL.

## 2021-01-28 ENCOUNTER — Other Ambulatory Visit: Payer: Self-pay | Admitting: Physician Assistant

## 2021-01-28 ENCOUNTER — Other Ambulatory Visit (HOSPITAL_COMMUNITY): Payer: Self-pay

## 2021-01-28 ENCOUNTER — Other Ambulatory Visit: Payer: Self-pay | Admitting: *Deleted

## 2021-01-28 DIAGNOSIS — Z79899 Other long term (current) drug therapy: Secondary | ICD-10-CM

## 2021-01-28 MED ORDER — COSENTYX SENSOREADY (300 MG) 150 MG/ML ~~LOC~~ SOAJ
SUBCUTANEOUS | 2 refills | Status: DC
  Filled 2021-01-28: qty 2, 28d supply, fill #0
  Filled 2021-03-12: qty 2, 28d supply, fill #1
  Filled 2021-04-14: qty 2, 28d supply, fill #2

## 2021-01-28 NOTE — Telephone Encounter (Signed)
Next Visit: 03/03/2021   Last Visit: 10/01/2020   Last Fill: 09/04/2020   DX: Psoriatic arthritis    Current Dose per office note 10/01/2020: Cosentyx 150 mg sq injections once every 14 days.   Labs: 12/31/2020 Glucose is 111.  ALT is elevated-75.   AST WNL. CBC WNL.   TB Gold: 09/03/2020 Neg   Okay to refill Cosentyx?

## 2021-01-29 LAB — HEPATIC FUNCTION PANEL
AG Ratio: 1.7 (calc) (ref 1.0–2.5)
ALT: 47 U/L — ABNORMAL HIGH (ref 9–46)
AST: 21 U/L (ref 10–40)
Albumin: 4.7 g/dL (ref 3.6–5.1)
Alkaline phosphatase (APISO): 51 U/L (ref 36–130)
Bilirubin, Direct: 0.1 mg/dL (ref 0.0–0.2)
Globulin: 2.8 g/dL (calc) (ref 1.9–3.7)
Indirect Bilirubin: 0.3 mg/dL (calc) (ref 0.2–1.2)
Total Bilirubin: 0.4 mg/dL (ref 0.2–1.2)
Total Protein: 7.5 g/dL (ref 6.1–8.1)

## 2021-01-29 NOTE — Progress Notes (Signed)
ALT is borderline elevated but has improved-47.  46 is WNL.  Rest of hepatic function panel is WNL. Recommend continuing the avoidance of excessive use of tylenol and alcohol use.

## 2021-01-30 ENCOUNTER — Other Ambulatory Visit (HOSPITAL_COMMUNITY): Payer: Self-pay

## 2021-02-17 NOTE — Progress Notes (Signed)
Office Visit Note  Patient: Jacob Massey             Date of Birth: 1980-04-02           MRN: [id number]             PCP: Deland Pretty, MD Referring: Deland Pretty, MD Visit Date: 03/03/2021 Occupation: @GUAROCC @  Subjective:  Medication management   History of Present Illness: Joniel Kokoszka is a 41 y.o. male with history of psoriatic arthritis, psoriasis and osteoarthritis.  He states he has been tolerating Cosentyx well without any side effects.  He continues to have intermittent pain in his hands.  He also had recent problem with the left foot [place] fasciitis which has improved.  None of the other joints are painful.  Activities of Daily Living:  Patient reports morning stiffness for 0 minutes.   Patient Denies nocturnal pain.  Difficulty dressing/grooming: Denies Difficulty climbing stairs: Denies Difficulty getting out of chair: Denies Difficulty using hands for taps, buttons, cutlery, and/or writing: Reports  Review of Systems  Constitutional:  Positive for fatigue.  HENT:  Negative for mouth sores, mouth dryness and nose dryness.   Eyes:  Positive for itching. Negative for pain and dryness.  Respiratory:  Negative for shortness of breath and difficulty breathing.   Cardiovascular:  Negative for chest pain and palpitations.  Gastrointestinal:  Positive for constipation. Negative for blood in stool and diarrhea.  Endocrine: Positive for increased urination.  Genitourinary:  Negative for difficulty urinating.  Musculoskeletal:  Negative for joint pain, joint pain, joint swelling, myalgias, morning stiffness, muscle tenderness and myalgias.  Skin:  Negative for color change, rash, redness and sensitivity to sunlight.  Allergic/Immunologic: Negative for susceptible to infections.  Neurological:  Negative for dizziness, numbness, headaches, memory loss and weakness.  Hematological:  Negative for bruising/bleeding tendency.  Psychiatric/Behavioral:  Positive for  depressed mood. Negative for confusion and sleep disturbance. The patient is not nervous/anxious.    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   Psoriasis 12/03/2015   Psoriatic arthritis ([place]) 12/03/2015   Bicipital tendinitis 12/03/2015   Hypertension 12/03/2015   GERD (gastroesophageal reflux disease) 12/03/2015   ADHD 12/03/2015   Genital herpes 12/03/2015   Depression 12/03/2015   Noncompliance 12/03/2015   Needle phobia 12/03/2015    Past Medical History:  Diagnosis Date   ADHD 12/03/2015   Bicipital tendinitis 12/03/2015   Genital herpes 12/03/2015   GERD (gastroesophageal reflux disease) 12/03/2015   Hypertension 12/03/2015   Psoriasis 12/03/2015   Psoriatic arthritis ([place]) 12/03/2015   Sinusitis    Tendonitis     Family History  Problem Relation Age of Onset   Arthritis Mother    Diabetes Mother        borderline    Arrhythmia Mother    Heart attack Father    Cancer Father    ADD / ADHD Daughter    Heart attack Maternal Grandfather    Past Surgical History:  Procedure Laterality Date   KNEE ARTHROSCOPY Left    VASECTOMY     Social History   Social History Narrative   Not on file    There is no immunization history on file for this patient.   Objective: Vital Signs: BP (!) 141/101 (BP Location: Left Arm, Patient Position:  Sitting, Cuff Size: Normal)    Pulse 93    Ht 6' (1.829 m)    Wt 229 lb (103.9 kg)    BMI 31.06 kg/m    Physical Exam Vitals and nursing note reviewed.  Constitutional:      Appearance: He is well-developed.  HENT:     Head: Normocephalic and atraumatic.  Eyes:     Conjunctiva/sclera: Conjunctivae normal.     Pupils: Pupils are equal, round, and reactive to light.  Cardiovascular:     Rate and  Rhythm: Normal rate and regular rhythm.     Heart sounds: Normal heart sounds.  Pulmonary:     Effort: Pulmonary effort is normal.     Breath sounds: Normal breath sounds.  Abdominal:     General: Bowel sounds are normal.     Palpations: Abdomen is soft.  Musculoskeletal:     Cervical back: Normal range of motion and neck supple.  Skin:    General: Skin is warm and dry.     Capillary Refill: Capillary refill takes less than 2 seconds.  Neurological:     Mental Status: He is alert and oriented to person, place, and time.  Psychiatric:        Behavior: Behavior normal.     Musculoskeletal Exam: C-spine was in good range of motion.  Shoulder joints, elbow joints, wrist joints, MCPs PIPs and DIPs with good range of motion with no synovitis.  Hip joints, knee joints, ankles, MTPs and PIPs with good range of motion with no synovitis.  He had no tenderness over SI joints.  CDAI Exam: CDAI Score: -- Patient Global: --; Provider Global: -- Swollen: --; Tender: -- Joint Exam 03/03/2021   No joint exam has been documented for this visit   There is currently no information documented on the homunculus. Go to the Rheumatology activity and complete the homunculus joint exam.  Investigation: No additional findings.  Imaging: No results found.  Recent Labs: Lab Results  Component Value Date   WBC 5.4 12/31/2020   HGB 15.1 12/31/2020   PLT 308 12/31/2020   NA 138 12/31/2020   K 4.4 12/31/2020   CL 101 12/31/2020   CO2 27 12/31/2020   GLUCOSE 111 (H) 12/31/2020   BUN 9 12/31/2020   CREATININE 1.11 12/31/2020   BILITOT 0.4 01/28/2021   ALKPHOS 55 08/17/2016   AST 21 01/28/2021   ALT 47 (H) 01/28/2021   PROT 7.5 01/28/2021   ALBUMIN 4.4 08/17/2016   CALCIUM 10.0 12/31/2020   GFRAA 105 05/01/2020   QFTBGOLD NEGATIVE 10/19/2016   QFTBGOLDPLUS NEGATIVE 09/03/2020    Speciality Comments: Prior therapy: Humira and Enbrel (inadequate response) MTX- fatigue  Procedures:  No  procedures performed Allergies: Adderall [amphetamine-dextroamphetamine] and Methotrexate derivatives   Assessment / Plan:     Visit Diagnoses: Psoriatic arthritis ([place]) - Ultrasound of the right hand performed on 03/29/2018 negative for synovitis: He had no synovitis on my examination today.  He has been tolerating medication well.  Patient reports intermittent left plantar fasciitis.  Psoriasis-he had no active lesions on exam today.  High risk medication use - Cosentyx 150 mg sq injections once every 14 days.  December 31, 2020 CBC was normal, CMP showed ALT elevation at 47.  He has been advised to get labs in February and then every 3 months to monitor for drug toxicity.  TB gold was negative on September 03, 2020.  Information regarding immunization was placed in the AVS.  He was also advised  to hold Cosentyx in case he develops an infection.  DDD (degenerative disc disease), thoracic-good mobility without any discomfort today.  Spondylosis of lumbar region without myelopathy or radiculopathy-need good mobility in his lumbar spine.  There was no SI joint tenderness.  He gives history of intermittent pain.  History of meniscal tear - left knee.  He still has intermittent discomfort.  History of depression  History of gastroesophageal reflux (GERD)  History of ADHD  History of hypertension-blood pressure was elevated today.  Has been advised to monitor blood pressure closely and follow-up with his PCP.  Orders: No orders of the defined types were placed in this encounter.  No orders of the defined types were placed in this encounter.    Follow-Up Instructions: Return in about 5 months (around 08/01/2021) for Psoriatic arthritis.   Bo Merino, MD  Note - This record has been created using Editor, commissioning.  Chart creation errors have been sought, but may not always  have been located. Such creation errors do not reflect on  the standard of medical care.

## 2021-02-21 IMAGING — CT CT HEART MORP W/ CTA COR W/ SCORE W/ CA W/CM &/OR W/O CM
1 series · 8 of 10 positions shown, 10 images · non-contrast
Comparison: None.
COMPARISON: None.
COMPARISON: None.

Addendum:
EXAM:
OVER-READ INTERPRETATION  CT CHEST

The following report is an over-read performed by radiologist Dr.
Jesus Antonio Kendrick [REDACTED] on 05/05/2019. This
over-read does not include interpretation of cardiac or coronary
anatomy or pathology. The coronary calcium score/coronary CTA
interpretation by the cardiologist is attached.
CLINICAL DATA: Chest pain
Cardiac/Coronary CTA
TECHNIQUE: The patient was scanned on a Phillips Force scanner. A 100 kV
prospective scan was triggered in the descending thoracic aorta at
111 HU's. Axial non-contrast 3 mm slices were carried out through
the heart. The data set was analyzed on a dedicated work station and
scored using the Agatson method. Gantry rotation speed was 250 msecs
and collimation was .6 mm. No beta blockade and 0.8 mg of sl NTG was
given. The 3D data set was reconstructed in 5% intervals of the
35-75 % of the R-R cycle. Diastolic phases were analyzed on a
dedicated work station using MPR, MIP and VRT modes. The patient
received 80 cc of contrast.

[Series 441: findings · 8 of 10 slices shown, 10 images]
[im 2/10  vessel]
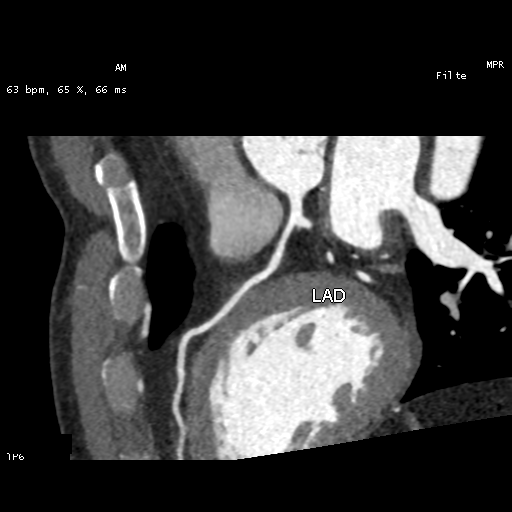
[im 2/10  lung]
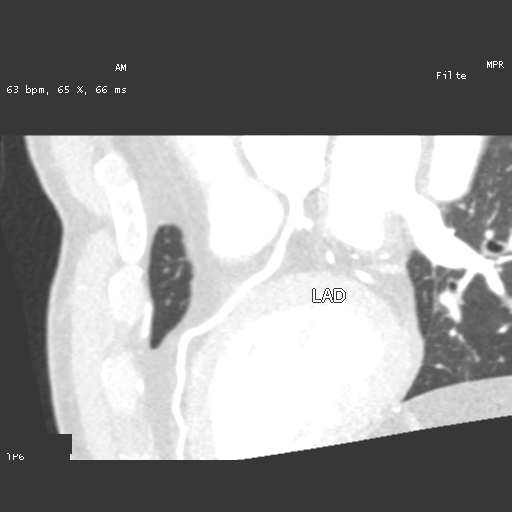
[im 3/10  vessel]
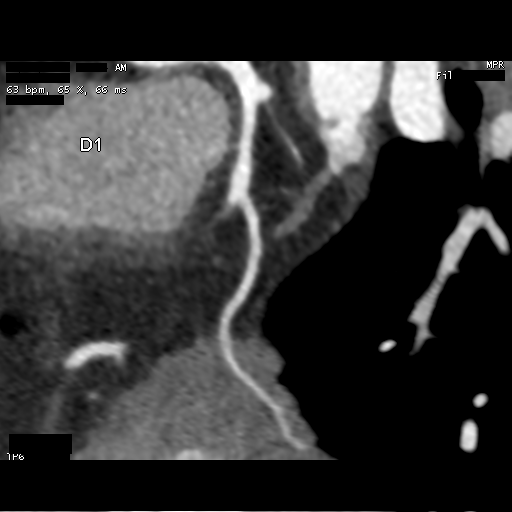
[im 4/10  vessel]
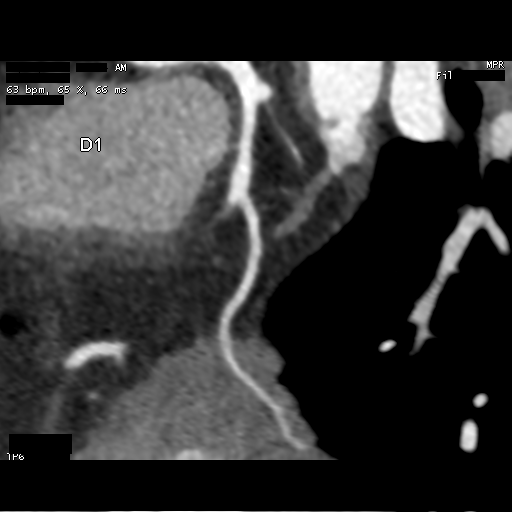
[im 5/10  vessel]
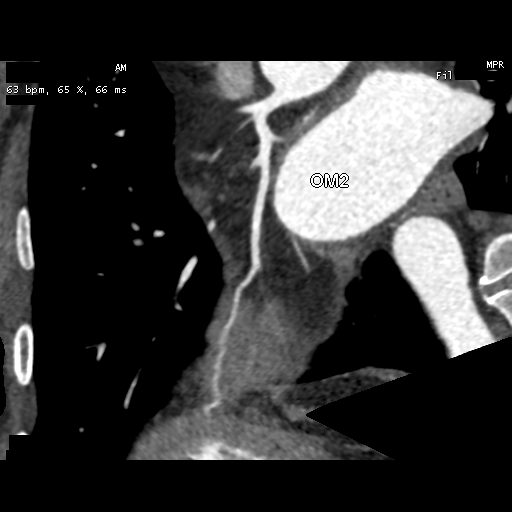
[im 6/10  vessel]
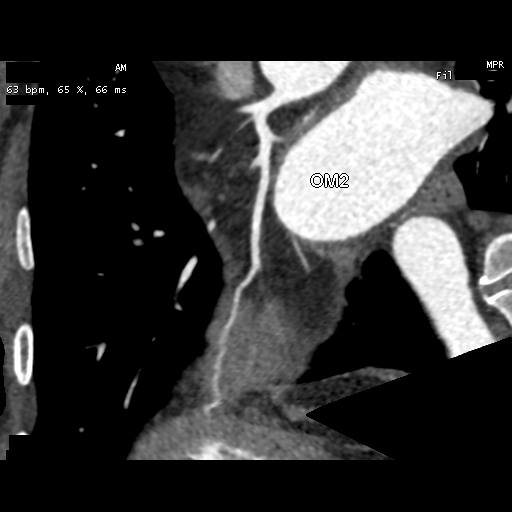
[im 6/10  lung]
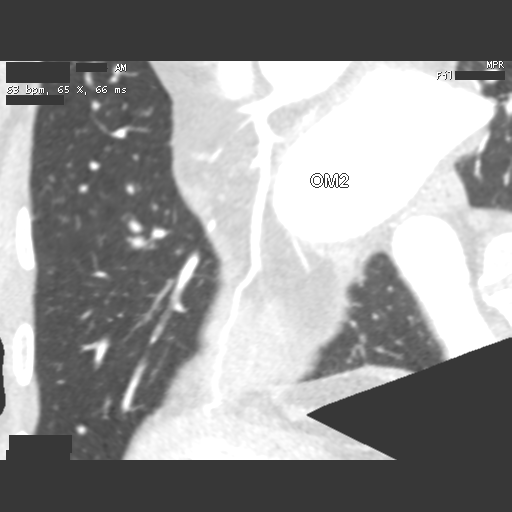
[im 7/10  vessel]
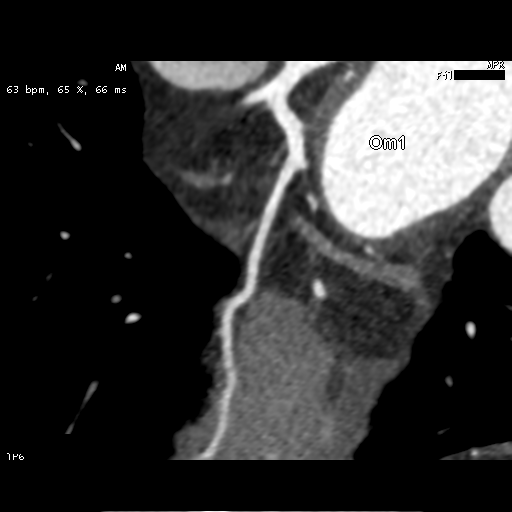
[im 8/10  vessel]
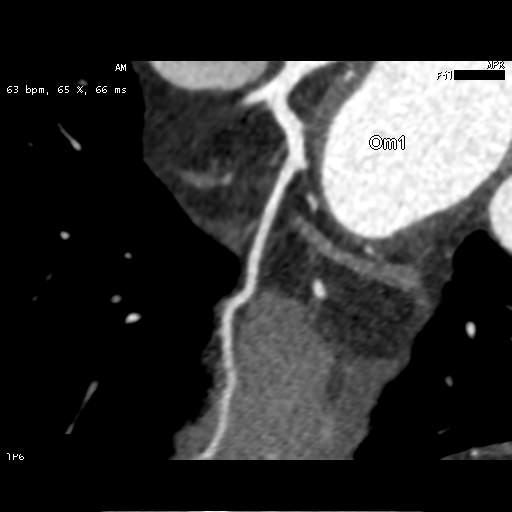
[im 9/10  vessel]
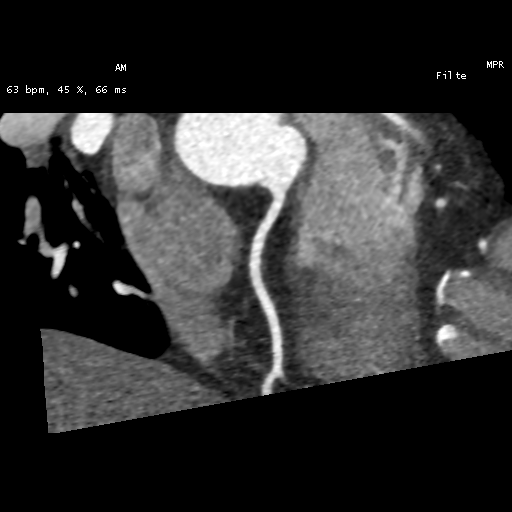

[8 of 10 positions shown; findings below may reference images not displayed]

FINDINGS: Within the visualized portions of the thorax there are no suspicious
appearing pulmonary nodules or masses, there is no acute
consolidative airspace disease, no pleural effusions, no
pneumothorax and no lymphadenopathy. Visualized portions of the
upper abdomen are unremarkable. There are no aggressive appearing
lytic or blastic lesions noted in the visualized portions of the
skeleton.
IMPRESSION: 1. No significant incidental noncardiac findings are noted.
FINDINGS: Image quality: Excellent. The very distal aspect of the coronary
arteries were not scanned due to the field of view being incomplete.

Noise artifact is: Limited.

Coronary Arteries:  Normal coronary origin.  Right dominance.

Left main: The left main is a large caliber vessel with a normal
take off from the left coronary cusp that bifurcates to form a left
anterior descending artery and a left circumflex artery. There is no
plaque or stenosis.

Left anterior descending artery: The proximal LAD contains minimal
non-calcified plaque (<25%). The mid and distal LAD segments are
patent without plaque or stenosis. Is patent without evidence of
plaque or stenosis. The LAD gives off 1 patent diagonal branch.

Left circumflex artery: The LCX is non-dominant and patent with no
evidence of plaque or stenosis. The LCX gives off 2 patent obtuse
marginal branches.

Right coronary artery: The RCA is dominant with normal take off from
the right coronary cusp. There is no evidence of plaque or stenosis
in the proximal or mid segments. The distal RCA is not visualized
due to incomplete field of view. A patent Vizenga Niiho is seen but the
PDA is not visualized due to incomplete FOV.

Right Atrium: Right atrial size is within normal limits.

Right Ventricle: The right ventricular cavity is within normal
limits.

Left Atrium: Left atrial size is normal in size with no left atrial
appendage filling defect.

Left Ventricle: The ventricular cavity size is within normal limits.
There are no stigmata of prior infarction. There is no abnormal
filling defect.

Pulmonary arteries: Normal in size without proximal filling defect.

Pulmonary veins: Normal pulmonary venous drainage.

Pericardium: Normal thickness with no significant effusion or
calcium present.

Cardiac valves: The aortic valve is trileaflet without significant
calcification. The mitral valve is normal structure without
significant calcification.

Aorta: Normal caliber with no significant disease.

Extra-cardiac findings: See attached radiology report for
non-cardiac structures.
IMPRESSION: 1. Coronary calcium score of 0.

2. Normal coronary origin with right dominance.

3. No evidence of CAD in the visualized segments.

4. Evaluation of the distal LAD and RCA is incomplete as the field
of view was too short on this study.

RECOMMENDATIONS:
1. No evidence of CAD (0%) with limitations noted above in the
distal segments. Consider non-atherosclerotic causes of chest pain.

ADDENDUM:
The report mentions minimal non-calcified plaque in the proximal
LAD. This is inaccurate. The LAD is normal as correctly stated in
the impressions.

*** End of Addendum ***
Addendum:
EXAM:
OVER-READ INTERPRETATION  CT CHEST

The following report is an over-read performed by radiologist Dr.
Jesus Antonio Kendrick [REDACTED] on 05/05/2019. This
over-read does not include interpretation of cardiac or coronary
anatomy or pathology. The coronary calcium score/coronary CTA
interpretation by the cardiologist is attached.
FINDINGS: Within the visualized portions of the thorax there are no suspicious
appearing pulmonary nodules or masses, there is no acute
consolidative airspace disease, no pleural effusions, no
pneumothorax and no lymphadenopathy. Visualized portions of the
upper abdomen are unremarkable. There are no aggressive appearing
lytic or blastic lesions noted in the visualized portions of the
skeleton.
IMPRESSION: 1. No significant incidental noncardiac findings are noted.
FINDINGS: Image quality: Excellent. The very distal aspect of the coronary
arteries were not scanned due to the field of view being incomplete.

Noise artifact is: Limited.

Coronary Arteries:  Normal coronary origin.  Right dominance.

Left main: The left main is a large caliber vessel with a normal
take off from the left coronary cusp that bifurcates to form a left
anterior descending artery and a left circumflex artery. There is no
plaque or stenosis.

Left anterior descending artery: The proximal LAD contains minimal
non-calcified plaque (<25%). The mid and distal LAD segments are
patent without plaque or stenosis. Is patent without evidence of
plaque or stenosis. The LAD gives off 1 patent diagonal branch.

Left circumflex artery: The LCX is non-dominant and patent with no
evidence of plaque or stenosis. The LCX gives off 2 patent obtuse
marginal branches.

Right coronary artery: The RCA is dominant with normal take off from
the right coronary cusp. There is no evidence of plaque or stenosis
in the proximal or mid segments. The distal RCA is not visualized
due to incomplete field of view. A patent Vizenga Niiho is seen but the
PDA is not visualized due to incomplete FOV.

Right Atrium: Right atrial size is within normal limits.

Right Ventricle: The right ventricular cavity is within normal
limits.

Left Atrium: Left atrial size is normal in size with no left atrial
appendage filling defect.

Left Ventricle: The ventricular cavity size is within normal limits.
There are no stigmata of prior infarction. There is no abnormal
filling defect.

Pulmonary arteries: Normal in size without proximal filling defect.

Pulmonary veins: Normal pulmonary venous drainage.

Pericardium: Normal thickness with no significant effusion or
calcium present.

Cardiac valves: The aortic valve is trileaflet without significant
calcification. The mitral valve is normal structure without
significant calcification.

Aorta: Normal caliber with no significant disease.

Extra-cardiac findings: See attached radiology report for
non-cardiac structures.
IMPRESSION: 1. Coronary calcium score of 0.

2. Normal coronary origin with right dominance.

3. No evidence of CAD in the visualized segments.

4. Evaluation of the distal LAD and RCA is incomplete as the field
of view was too short on this study.

RECOMMENDATIONS:
1. No evidence of CAD (0%) with limitations noted above in the
distal segments. Consider non-atherosclerotic causes of chest pain.

*** End of Addendum ***
EXAM:
OVER-READ INTERPRETATION  CT CHEST

The following report is an over-read performed by radiologist Dr.
Jesus Antonio Kendrick [REDACTED] on 05/05/2019. This
over-read does not include interpretation of cardiac or coronary
anatomy or pathology. The coronary calcium score/coronary CTA
interpretation by the cardiologist is attached.
FINDINGS: Within the visualized portions of the thorax there are no suspicious
appearing pulmonary nodules or masses, there is no acute
consolidative airspace disease, no pleural effusions, no
pneumothorax and no lymphadenopathy. Visualized portions of the
upper abdomen are unremarkable. There are no aggressive appearing
lytic or blastic lesions noted in the visualized portions of the
skeleton.
IMPRESSION: 1. No significant incidental noncardiac findings are noted.

## 2021-02-27 ENCOUNTER — Other Ambulatory Visit (HOSPITAL_COMMUNITY): Payer: Self-pay

## 2021-03-03 ENCOUNTER — Other Ambulatory Visit (HOSPITAL_COMMUNITY): Payer: Self-pay

## 2021-03-03 ENCOUNTER — Other Ambulatory Visit: Payer: Self-pay

## 2021-03-03 ENCOUNTER — Ambulatory Visit: Payer: BC Managed Care – PPO | Admitting: Rheumatology

## 2021-03-03 ENCOUNTER — Encounter: Payer: Self-pay | Admitting: Rheumatology

## 2021-03-03 VITALS — BP 141/101 | HR 93 | Ht 72.0 in | Wt 229.0 lb

## 2021-03-03 DIAGNOSIS — Z8659 Personal history of other mental and behavioral disorders: Secondary | ICD-10-CM

## 2021-03-03 DIAGNOSIS — M5134 Other intervertebral disc degeneration, thoracic region: Secondary | ICD-10-CM | POA: Diagnosis not present

## 2021-03-03 DIAGNOSIS — L4050 Arthropathic psoriasis, unspecified: Secondary | ICD-10-CM | POA: Diagnosis not present

## 2021-03-03 DIAGNOSIS — Z8719 Personal history of other diseases of the digestive system: Secondary | ICD-10-CM

## 2021-03-03 DIAGNOSIS — Z79899 Other long term (current) drug therapy: Secondary | ICD-10-CM

## 2021-03-03 DIAGNOSIS — Z87828 Personal history of other (healed) physical injury and trauma: Secondary | ICD-10-CM

## 2021-03-03 DIAGNOSIS — M47816 Spondylosis without myelopathy or radiculopathy, lumbar region: Secondary | ICD-10-CM

## 2021-03-03 DIAGNOSIS — F40298 Other specified phobia: Secondary | ICD-10-CM

## 2021-03-03 DIAGNOSIS — L409 Psoriasis, unspecified: Secondary | ICD-10-CM | POA: Diagnosis not present

## 2021-03-03 DIAGNOSIS — Z8679 Personal history of other diseases of the circulatory system: Secondary | ICD-10-CM

## 2021-03-03 NOTE — Patient Instructions (Signed)
Standing Labs We placed an order today for your standing lab work.   Please have your standing labs drawn in February and every 3 months  If possible, please have your labs drawn 2 weeks prior to your appointment so that the provider can discuss your results at your appointment.  Please note that you may see your imaging and lab results in MyChart before we have reviewed them. We may be awaiting multiple results to interpret others before contacting you. Please allow our office up to 72 hours to thoroughly review all of the results before contacting the office for clarification of your results.  We have open lab daily: Monday through Thursday from 1:30-4:30 PM and Friday from 1:30-4:00 PM at the office of Dr. Pollyann Savoy, [facility].   Please be advised, all patients with office appointments requiring lab work will take precedent over walk-in lab work.  If possible, please come for your lab work on Monday and Friday afternoons, as you may experience shorter wait times. The office is located at [address], [address], [place], [place] [address] No appointment is necessary.   Labs are drawn by [facility]. Please bring your co-pay at the time of your lab draw.  You may receive a bill from [facility] for your lab work.  Please note if you are on Hydroxychloroquine and and an order has been placed for a Hydroxychloroquine level, you will need to have it drawn 4 hours or more after your last dose.  If you wish to have your labs drawn at another location, please call the office 24 hours in advance to send orders.  If you have any questions regarding directions or hours of operation,  please call [phone].   As a reminder, please drink plenty of water prior to coming for your lab work. Thanks!   Vaccines You are taking a medication(s) that can suppress your immune system.  The following immunizations are recommended: Flu annually Covid-19  Td/Tdap (tetanus, diphtheria,  pertussis) every 10 years Pneumonia (Prevnar 15 then Pneumovax 23 at least 1 year apart.  Alternatively, can take Prevnar 20 without needing additional dose) Shingrix: 2 doses from 4 weeks to 6 months apart  Please check with your PCP to make sure you are up to date.  If you have signs or symptoms of an infection or start antibiotics: First, call your PCP for workup of your infection. Hold your medication through the infection, until you complete your antibiotics, and until symptoms resolve if you take the following: Injectable medication (Actemra, Benlysta, Cimzia, Cosentyx, Enbrel, Humira, Kevzara, Orencia, Remicade, Simponi, Stelara, Taltz, Tremfya) Methotrexate Leflunomide (Arava) Mycophenolate (Cellcept) Harriette Ohara, Olumiant, or Rinvoq  Heart Disease Prevention   Your inflammatory disease increases your risk of heart disease which includes heart attack, stroke, atrial fibrillation (irregular heartbeats), high blood pressure, heart failure and atherosclerosis (plaque in the arteries).  It is important to reduce your risk by:   Keep blood pressure, cholesterol, and blood sugar at healthy levels   Smoking Cessation   Maintain a healthy weight  BMI 20-25   Eat a healthy diet  Plenty of fresh fruit, vegetables, and whole grains  Limit saturated fats, foods high in sodium, and added sugars  DASH and Mediterranean diet   Increase physical activity  Recommend moderate physically activity for 150 minutes per week/ 30 minutes a day for five days a week These can be broken up into three separate ten-minute sessions during the day.   Reduce Stress  Meditation, slow breathing  exercises, yoga, coloring books  Dental visits twice a year

## 2021-03-12 ENCOUNTER — Other Ambulatory Visit (HOSPITAL_COMMUNITY): Payer: Self-pay

## 2021-03-25 ENCOUNTER — Other Ambulatory Visit (HOSPITAL_COMMUNITY): Payer: Self-pay

## 2021-03-31 ENCOUNTER — Other Ambulatory Visit (HOSPITAL_COMMUNITY): Payer: Self-pay

## 2021-04-10 ENCOUNTER — Other Ambulatory Visit (HOSPITAL_COMMUNITY): Payer: Self-pay

## 2021-04-14 ENCOUNTER — Other Ambulatory Visit (HOSPITAL_COMMUNITY): Payer: Self-pay

## 2021-05-08 ENCOUNTER — Other Ambulatory Visit: Payer: Self-pay | Admitting: Physician Assistant

## 2021-05-08 ENCOUNTER — Other Ambulatory Visit (HOSPITAL_COMMUNITY): Payer: Self-pay

## 2021-05-08 MED ORDER — COSENTYX SENSOREADY (300 MG) 150 MG/ML ~~LOC~~ SOAJ
SUBCUTANEOUS | 2 refills | Status: DC
  Filled 2021-05-08: qty 2, 28d supply, fill #0
  Filled 2021-06-05: qty 2, 28d supply, fill #1
  Filled 2021-07-02: qty 2, 28d supply, fill #2

## 2021-05-08 NOTE — Telephone Encounter (Signed)
Next Visit: Return in about 5 months (around 08/01/2021) for Psoriatic arthritis, I called patient, patient will come in on 05/12/2021 for labs and schedule appt at that time. ? ?Last Visit: 03/03/2021 ? ?Last Fill: 01/28/2021 ? ?DX: Psoriatic arthritis  ? ?Current Dose per office note 03/03/2020: Cosentyx 150 mg sq injections once every 14 days ? ?Labs: 12/31/2020, Glucose is 111.  ALT is elevated-75.   AST WNL.  Please clarify if he is taking any tylenol, NSAIDs, or alcohol use.  He should avoid these things and repeat hepatic function panel in 1 month.  ?CBC WNL.  ? ?01/28/2021  ALT is borderline elevated but has improved-47.  46 is WNL.  Rest of hepatic function panel is WNL. Recommend continuing the avoidance of excessive use of tylenol and alcohol use. ? ?I called patient, patient will come in on 05/12/2021 for labs. ? ?TB Gold: 09/03/2020, negative  ? ?Okay to refill Cosentyx? ? ?

## 2021-05-12 ENCOUNTER — Other Ambulatory Visit: Payer: Self-pay

## 2021-05-12 DIAGNOSIS — Z79899 Other long term (current) drug therapy: Secondary | ICD-10-CM

## 2021-05-12 DIAGNOSIS — L409 Psoriasis, unspecified: Secondary | ICD-10-CM

## 2021-05-13 LAB — COMPLETE METABOLIC PANEL WITH GFR
AG Ratio: 1.7 (calc) (ref 1.0–2.5)
ALT: 64 U/L — ABNORMAL HIGH (ref 9–46)
AST: 25 U/L (ref 10–40)
Albumin: 4.8 g/dL (ref 3.6–5.1)
Alkaline phosphatase (APISO): 47 U/L (ref 36–130)
BUN: 12 mg/dL (ref 7–25)
CO2: 27 mmol/L (ref 20–32)
Calcium: 10 mg/dL (ref 8.6–10.3)
Chloride: 101 mmol/L (ref 98–110)
Creat: 1.04 mg/dL (ref 0.60–1.29)
Globulin: 2.9 g/dL (calc) (ref 1.9–3.7)
Glucose, Bld: 112 mg/dL — ABNORMAL HIGH (ref 65–99)
Potassium: 4.6 mmol/L (ref 3.5–5.3)
Sodium: 138 mmol/L (ref 135–146)
Total Bilirubin: 0.5 mg/dL (ref 0.2–1.2)
Total Protein: 7.7 g/dL (ref 6.1–8.1)
eGFR: 91 mL/min/{1.73_m2} (ref >=60)

## 2021-05-13 LAB — CBC WITH DIFFERENTIAL/PLATELET
Absolute Monocytes: 552 cells/uL (ref 200–950)
Basophils Absolute: 43 cells/uL (ref 0–200)
Basophils Relative: 0.7 %
Eosinophils Absolute: 149 cells/uL (ref 15–500)
Eosinophils Relative: 2.4 %
HCT: 45.3 % (ref 38.5–50.0)
Hemoglobin: 15.4 g/dL (ref 13.2–17.1)
Lymphs Abs: 2145 cells/uL (ref 850–3900)
MCH: 30.6 pg (ref 27.0–33.0)
MCHC: 34 g/dL (ref 32.0–36.0)
MCV: 90.1 fL (ref 80.0–100.0)
MPV: 9.5 fL (ref 7.5–12.5)
Monocytes Relative: 8.9 %
Neutro Abs: 3311 cells/uL (ref 1500–7800)
Neutrophils Relative %: 53.4 %
Platelets: 334 10*3/uL (ref 140–400)
RBC: 5.03 10*6/uL (ref 4.20–5.80)
RDW: 13.1 % (ref 11.0–15.0)
Total Lymphocyte: 34.6 %
WBC: 6.2 10*3/uL (ref 3.8–10.8)

## 2021-05-13 NOTE — Progress Notes (Signed)
CBC is normal.  ALT is elevated.  Please advise patient to avoid all NSAIDs and alcohol intake.  Please advise low-fat diet.  Patient should have repeat LFTs in 1 month.

## 2021-05-14 ENCOUNTER — Telehealth: Payer: Self-pay | Admitting: *Deleted

## 2021-05-14 ENCOUNTER — Other Ambulatory Visit (HOSPITAL_COMMUNITY): Payer: Self-pay

## 2021-05-14 DIAGNOSIS — Z79899 Other long term (current) drug therapy: Secondary | ICD-10-CM

## 2021-05-14 NOTE — Telephone Encounter (Signed)
-----   Message from Pollyann Savoy, MD sent at 05/13/2021 12:40 PM EDT ----- ?CBC is normal.  ALT is elevated.  Please advise patient to avoid all NSAIDs and alcohol intake.  Please advise low-fat diet.  Patient should have repeat LFTs in 1 month. ?

## 2021-06-05 ENCOUNTER — Other Ambulatory Visit (HOSPITAL_COMMUNITY): Payer: Self-pay

## 2021-06-09 NOTE — Progress Notes (Signed)
Office Visit Note  Patient: Jacob Massey             Date of Birth: October 10, 1980           MRN: [id number]             PCP: Merri Brunette, MD Referring: Merri Brunette, MD Visit Date: 06/23/2021 Occupation: @GUAROCC @  Subjective:  Stiffness in both hands   History of Present Illness: Jacob Massey is a 41 y.o. male with history of psoriatic arthritis and DDD. He remains on Cosentyx 150 mg sq injections once every 14 days.  He has not missed any doses of Cosentyx recently.  He states he has had some intermittent pain and stiffness in his hands.  He says last week he was bowling and had increased discomfort in the right third and fourth PIP joints.  He has had some stiffness in the left third PIP joint as well.  His symptoms typically improve after running his hands under warm water and moving his fingers.  He has occasional pain and stiffness in his lower back but continues to see a chiropractor on a monthly basis.  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  He denies any Achilles tendinitis or planter fasciitis.  He denies any active psoriasis at this time. He denies any recent infections. He states that he recently wore a heart monitor ordered by his PCP.  He has an upcoming appointment with cardiology tomorrow for further evaluation.    Activities of Daily Living:  Patient reports morning stiffness for a few minutes.   Patient Denies nocturnal pain.  Difficulty dressing/grooming: Denies Difficulty climbing stairs: Denies Difficulty getting out of chair: Denies Difficulty using hands for taps, buttons, cutlery, and/or writing: Denies  Review of Systems  Constitutional:  Positive for fatigue.  HENT:  Negative for mouth sores, mouth dryness and nose dryness.   Eyes:  Negative for pain, itching and dryness.  Respiratory:  Negative for shortness of breath and difficulty breathing.   Cardiovascular:  Negative for  chest pain and palpitations.  Gastrointestinal:  Positive for constipation. Negative for blood in stool and diarrhea.  Endocrine: Positive for increased urination.  Genitourinary:  Negative for difficulty urinating.  Musculoskeletal:  Positive for morning stiffness. Negative for joint pain, joint pain, joint swelling, myalgias, muscle tenderness and myalgias.  Skin:  Negative for color change, rash and redness.  Allergic/Immunologic: Negative for susceptible to infections.  Neurological:  Positive for dizziness. Negative for numbness, headaches, memory loss and weakness.  Hematological:  Negative for bruising/bleeding tendency.  Psychiatric/Behavioral:  Negative for confusion.    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    Past Medical History:  Diagnosis Date   ADHD 12/03/2015   Bicipital tendinitis 12/03/2015   Genital herpes 12/03/2015   GERD (gastroesophageal reflux disease) 12/03/2015   Hypertension 12/03/2015   Psoriasis 12/03/2015   Psoriatic arthritis (HCC) 12/03/2015   Sinusitis    Tendonitis  Family History  Problem Relation Age of Onset   Arthritis Mother    Diabetes Mother        borderline    Arrhythmia Mother    Heart attack Father    Cancer Father    ADD / ADHD Daughter    Heart attack Maternal Grandfather    Past Surgical History:  Procedure Laterality Date   KNEE ARTHROSCOPY Left    VASECTOMY     Social History    Social History Narrative   Not on file    There is no immunization history on file for this patient.   Objective: Vital Signs: BP (!) 149/95 (BP Location: Left Arm, Patient Position: Sitting, Cuff Size: Normal)   Pulse 60   Ht 6' (1.829 m)   Wt 235 lb 3.2 oz (106.7 kg)   BMI 31.90 kg/m    Physical Exam Vitals and nursing note reviewed.  Constitutional:      Appearance: He is well-developed.  HENT:     Head: Normocephalic and atraumatic.  Eyes:     Conjunctiva/sclera: Conjunctivae normal.     Pupils: Pupils are equal, round, and reactive to light.  Cardiovascular:     Rate and Rhythm: Normal rate and regular rhythm.     Heart sounds: Normal heart sounds.  Pulmonary:     Effort: Pulmonary effort is normal.     Breath sounds: Normal breath sounds.  Abdominal:     General: Bowel sounds are normal.     Palpations: Abdomen is soft.  Musculoskeletal:     Cervical back: Normal range of motion and neck supple.  Skin:    General: Skin is warm and dry.     Capillary Refill: Capillary refill takes less than 2 seconds.  Neurological:     Mental Status: He is alert and oriented to person, place, and time.  Psychiatric:        Behavior: Behavior normal.     Musculoskeletal Exam: C-spine, thoracic spine, lumbar spine have good range of motion.  No midline spinal tenderness.  He has some tenderness ovation over both SI joints.  Shoulder joints, elbow joints, wrist joints, MCPs, PIPs, DIPs have good range of motion with no synovitis.  Some tenderness over the left third PIP joint noted.  Complete fist formation bilaterally.  PIP and DIP prominence noted.  Hip joints have good range of motion with no groin pain.  Some tenderness palpation over bilateral trochanteric bursa.  Knee joints have good range of motion with no warmth or effusion.  Ankle joints have good range of motion with no tenderness or joint swelling.  No evidence of Achilles tendinitis.  CDAI Exam: CDAI Score:  -- Patient Global: --; Provider Global: -- Swollen: --; Tender: -- Joint Exam 06/23/2021   No joint exam has been documented for this visit   There is currently no information documented on the homunculus. Go to the Rheumatology activity and complete the homunculus joint exam.  Investigation: No additional findings.  Imaging: No results found.  Recent Labs: Lab Results  Component Value Date   WBC 6.2 05/12/2021   HGB 15.4 05/12/2021   PLT 334 05/12/2021   NA 138 05/12/2021   K 4.6 05/12/2021   CL 101 05/12/2021   CO2 27 05/12/2021   GLUCOSE 112 (H) 05/12/2021   BUN 12 05/12/2021   CREATININE 1.04 05/12/2021   BILITOT 0.5 05/12/2021   ALKPHOS 55 08/17/2016   AST 25 05/12/2021   ALT 64 (H) 05/12/2021   PROT  7.7 05/12/2021   ALBUMIN 4.4 08/17/2016   CALCIUM 10.0 05/12/2021   GFRAA 105 05/01/2020   QFTBGOLD NEGATIVE 10/19/2016   QFTBGOLDPLUS NEGATIVE 09/03/2020    Speciality Comments: Prior therapy: Humira and Enbrel (inadequate response) MTX- fatigue  Procedures:  No procedures performed Allergies: Adderall [amphetamine-dextroamphetamine] and Methotrexate derivatives   Assessment / Plan:     Visit Diagnoses: Psoriatic arthritis (HCC) - Ultrasound of the right hand performed on 03/29/2018 negative for synovitis: He has no synovitis or dactylitis on examination today.  He has been experiencing intermittent pain and stiffness in both hands especially in his PIP joints.  Last week he was bowling and had increased discomfort and stiffness in the right third and fourth PIP joints.  He has some tenderness palpation over the left third PIP joint on examination today.  No inflammation was noted.  He experiences occasional pain and stiffness in his SI joints.  He has not noticed any increased morning stiffness or nocturnal pain.  Discussed the importance of regular exercise and joint protection.  Overall his chronic arthritis is well controlled on Cosentyx 150 mg sq injections every  14 days.  He continues to tolerate Cosentyx without any side effects or injection site reactions.  He has not missed any doses recently.  He has no active psoriasis at this time.  He has not had any recent infections.  He will remain on Cosentyx as monotherapy.  He was advised to notify us if he develops signs or symptoms of a flare.  We discussed signs and symptoms to monitor for closely.  He will follow-up in the office in 5 months or sooner if needed.  Psoriasis: He has no active psoriasis at this time.  High risk medication use - Cosentyx 150 mg sq injections once every 14 days. CBC and CMP updated on 05/12/21.  Results were discussed with the patient today in detail.  ALT remains elevated.  Advised the patient to avoid Tylenol and alcohol use.  He just got back from vacation so he does not want to repeat his lab work at this time.  His next lab work will be due in July and every 3 months to monitor for drug toxicity.  Standing orders for CBC and CMP remain in place. TB gold negative on 09/03/20.  Future order for TB gold placed today. He has not had any recent infections.  Discussed the importance of holding Cosentyx if he develops signs or symptoms of infection and to resume once the infection is completely cleared.  - Plan: QuantiFERON-TB Gold Plus  Screening for tuberculosis -Future order for TB gold placed today.  Plan: QuantiFERON-TB Gold Plus  DDD (degenerative disc disease), thoracic: No midline spinal tenderness in the thoracic region.  He is seeing a chiropractor on a monthly basis.  Spondylosis of lumbar region without myelopathy or radiculopathy: He continues to experience intermittent discomfort in his lower back.  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  He has no symptoms of radiculopathy currently.  He continues to see a chiropractor on a monthly basis.  History of meniscal tear - left knee.  Doing well.  Good  range of motion with no warmth or effusion.  Other medical conditions are listed as follows:  History of gastroesophageal reflux (GERD)  History of depression  History of ADHD  History of hypertension: Blood pressure was 149/95 today in the office.  He has upcoming appointment with cardiology tomorrow.  Orders: Orders Placed This Encounter  Procedures   QuantiFERON-TB Gold Plus   No orders of the defined types were placed in this encounter.   Follow-Up Instructions: Return in about 5 months (around 11/23/2021) for Psoriatic arthritis, DDD.   Gearldine Bienenstock, PA-C  Note - This record has been created using Dragon software.  Chart creation errors have been sought, but may not always  have been located. Such creation errors do not reflect on  the standard of medical care.

## 2021-06-22 NOTE — Progress Notes (Unsigned)
Cardiology Office Note:   Date:  06/24/2021  NAME:  Jacob Massey    MRN: [id number] DOB:  1980-06-28   PCP:  Jacob Brunette, MD  Cardiologist:  None  Electrophysiologist:  None   Referring MD: Jacob Brunette, MD   Chief Complaint  Patient presents with   Follow-up         History of Present Illness:   Jacob Massey is a 41 y.o. male with a hx of psoriatic arthritis who presents for follow-up.  Recently completed a monitor through his primary care physician.  Apparently this was ordered due to history of atrial fibrillation in the family.  He reports he may notice his heart racing at times.  He does take dextroamphetamine.  He also drinks plenty of coffee.  He does work overnight for [employer].  I did review the monitor from his primary care physician.  This all shows normal sinus rhythm with sinus tachycardia.  There was concern for possible supraventricular tachycardia at 430 AM in the morning on 03/03/2021.  Apparently he was likely driving to [employer].  He works for [employer] and works overnight.  He takes his Ritalin at 11 PM.  He also drinks coffee.  He reports he notices his heart racing when he does Ritalin as well as caffeine.  He has reduced the dose recently.  Currently on blood pressure medication as well.  No evidence of atrial fibrillation or arrhythmia on my review of his monitor.  No recent thyroid studies.  Recent hemoglobin 15.4.  Denies any chest pain or trouble breathing.  Coronary CTA in the past was negative.  EKG in office demonstrates sinus rhythm with no acute ischemic changes.  Cardiovascular examination is normal.  Continues to report intermittent episodes of rapid heartbeat sensation.  Again this occurs in the setting of Ritalin use as well as excess caffeine consumption.   Past Medical History: Past Medical History:  Diagnosis Date   ADHD 12/03/2015   Bicipital tendinitis 12/03/2015   Genital herpes 12/03/2015   GERD (gastroesophageal reflux disease) 12/03/2015    Hypertension 12/03/2015   Psoriasis 12/03/2015   Psoriatic arthritis (HCC) 12/03/2015   Psoriatic arthritis (HCC)    Sinusitis    Tendonitis     Past Surgical History: Past Surgical History:  Procedure Laterality Date   KNEE ARTHROSCOPY Left    VASECTOMY      Current Medications: Current Meds  Medication Sig   amphetamine-dextroamphetamine (ADDERALL) 20 MG tablet Take 20 mg by mouth 3 (three) times daily.   guanFACINE (INTUNIV) 2 MG TB24 ER tablet Take 4 mg by mouth every morning.   losartan (COZAAR) 50 MG tablet Take by mouth daily.   metFORMIN (GLUCOPHAGE-XR) 750 MG 24 hr tablet Take 750 mg by mouth daily.   pantoprazole (PROTONIX) 40 MG tablet Take 40 mg by mouth daily.   Secukinumab, 300 MG Dose, (COSENTYX SENSOREADY, 300 MG,) 150 MG/ML SOAJ INJECT 150 MG (1 PEN) INTO THE SKIN EVERY 14 (FOURTEEN) DAYS.     Allergies:    Adderall [amphetamine-dextroamphetamine] and Methotrexate derivatives   Social History: Social History   Socioeconomic History   Marital status: Married    Spouse name: 3   Number of children: Not on file   Years of education: Not on file   Highest education level: Not on file  Occupational History   Not on file  Tobacco Use   Smoking status: Former    Packs/day: 1.00    Years: 5.00    Pack years: 5.00  Types: Cigarettes    Quit date: 2004    Years since quitting: 19.4    Passive exposure: Never   Smokeless tobacco: Never  Vaping Use   Vaping Use: Never used  Substance and Sexual Activity   Alcohol use: Yes    Alcohol/week: 6.0 standard drinks    Types: 6 Cans of beer per week    Comment: 6 beers per week   Drug use: No   Sexual activity: Not on file  Other Topics Concern   Not on file  Social History Narrative   Not on file   Social Determinants of Health   Financial Resource Strain: Not on file  Food Insecurity: Not on file  Transportation Needs: Not on file  Physical Activity: Not on file  Stress: Not on file  Social  Connections: Not on file     Family History: The patient's family history includes ADD / ADHD in his daughter; Arrhythmia in his mother; Arthritis in his mother; Cancer in his father; Diabetes in his mother; Heart attack in his father and maternal grandfather.  ROS:   All other ROS reviewed and negative. Pertinent positives noted in the HPI.     EKGs/Labs/Other Studies Reviewed:   The following studies were personally reviewed by me today:  EKG:  EKG is ordered today.  The ekg ordered today demonstrates normal sinus rhythm heart rate 61, no acute ischemic changes or evidence of infarction, and was personally reviewed by me.   CCTA 05/05/2019 IMPRESSION: 1. Coronary calcium score of 0.   2. Normal coronary origin with right dominance.   3. No evidence of CAD in the visualized segments.   4. Evaluation of the distal LAD and RCA is incomplete as the field of view was too short on this study.  Recent Labs: 05/12/2021: ALT 64; BUN 12; Creat 1.04; Hemoglobin 15.4; Platelets 334; Potassium 4.6; Sodium 138   Recent Lipid Panel No results found for: CHOL, TRIG, HDL, CHOLHDL, VLDL, LDLCALC, LDLDIRECT  Physical Exam:   VS:  BP 126/90   Pulse 61   Ht 6' (1.829 m)   Wt 232 lb 6.4 oz (105.4 kg)   SpO2 95%   BMI 31.52 kg/m    Wt Readings from Last 3 Encounters:  06/24/21 232 lb 6.4 oz (105.4 kg)  06/23/21 235 lb 3.2 oz (106.7 kg)  03/03/21 229 lb (103.9 kg)    General: Well nourished, well developed, in no acute distress Head: Atraumatic, normal size  Eyes: PEERLA, EOMI  Neck: Supple, no JVD Endocrine: No thryomegaly Cardiac: Normal S1, S2; RRR; no murmurs, rubs, or gallops Lungs: Clear to auscultation bilaterally, no wheezing, rhonchi or rales  Abd: Soft, nontender, no hepatomegaly  Ext: No edema, pulses 2+ Musculoskeletal: No deformities, BUE and BLE strength normal and equal Skin: Warm and dry, no rashes   Neuro: Alert and oriented to person, place, time, and situation,  CNII-XII grossly intact, no focal deficits  Psych: Normal mood and affect   ASSESSMENT:   Jacob Massey is a 41 y.o. male who presents for the following: 1. Tachycardia     PLAN:   1. Tachycardia -Recent monitor completed by primary care physician.  On my review he had sinus tachycardia that occurred at 4:30 AM.  He works overnight at [employer].  He takes Reglan at 11 PM.  He also drinks caffeine products such as coffee.  He consumes this quite frequently.  He reports he can feel jittery after Reglan and caffeine.  I believe this  is the likely explanation for his tachycardia.  To me this is not supraventricular tachycardia.  He was awake.  He completed coronary CTA last year that was normal.  Cardiovascular examination is normal today.  EKG is normal in office.  Would update a TSH.  Most recent hemoglobin is within limits.  Again I have asked him to reduce his caffeine consumption as well as lose weight.  He does have mild sleep apnea which is being treated conservatively.  This also could be contributing.  Does not need CPAP at this time.  Would keep a close eye on this.  Again with weight loss and exercise I think this would help.  I also encouraged him to drink plenty of water.  He seems to be doing this well.  He will see us back as needed.     Disposition: Return if symptoms worsen or fail to improve.  Medication Adjustments/Labs and Tests Ordered: Current medicines are reviewed at length with the patient today.  Concerns regarding medicines are outlined above.  Orders Placed This Encounter  Procedures   TSH   EKG 12-Lead   No orders of the defined types were placed in this encounter.   Patient Instructions  Medication Instructions:  The current medical regimen is effective;  continue present plan and medications.  *If you need a refill on your cardiac medications before your next appointment, please call your pharmacy*   Lab Work: TSH today   If you have labs (blood work) drawn  today and your tests are completely normal, you will receive your results only by: MyChart Message (if you have MyChart) OR A paper copy in the mail If you have any lab test that is abnormal or we need to change your treatment, we will call you to review the results.    Follow-Up: At [facility], you and your health needs are our priority.  As part of our continuing mission to provide you with exceptional heart care, we have created designated Provider Care Teams.  These Care Teams include your primary Cardiologist (physician) and Advanced Practice Providers (APPs -  Physician Assistants and Nurse Practitioners) who all work together to provide you with the care you need, when you need it.  We recommend signing up for the patient portal called "MyChart".  Sign up information is provided on this After Visit Summary.  MyChart is used to connect with patients for Virtual Visits (Telemedicine).  Patients are able to view lab/test results, encounter notes, upcoming appointments, etc.  Non-urgent messages can be sent to your provider as well.   To learn more about what you can do with MyChart, go to [email or web address].    Your next appointment:   As needed  The format for your next appointment:   In Person  Provider:   Lennie OdorWesley O'Neal, MD            Time Spent with Patient: I have spent a total of 25 minutes with patient reviewing hospital notes, telemetry, EKGs, labs and examining the patient as well as establishing an assessment and plan that was discussed with the patient.  > 50% of time was spent in direct patient care.  Signed, Lenna GilfordWesley T. Flora Lipps'Neal, MD, [facility] [place]  [facility]  [address], [address] [place], [place] [address] [phone]  06/24/2021 4:39 PM

## 2021-06-23 ENCOUNTER — Ambulatory Visit: Payer: BC Managed Care – PPO | Admitting: Physician Assistant

## 2021-06-23 ENCOUNTER — Encounter: Payer: Self-pay | Admitting: Physician Assistant

## 2021-06-23 VITALS — BP 149/95 | HR 60 | Ht 72.0 in | Wt 235.2 lb

## 2021-06-23 DIAGNOSIS — Z8679 Personal history of other diseases of the circulatory system: Secondary | ICD-10-CM

## 2021-06-23 DIAGNOSIS — M47816 Spondylosis without myelopathy or radiculopathy, lumbar region: Secondary | ICD-10-CM

## 2021-06-23 DIAGNOSIS — Z79899 Other long term (current) drug therapy: Secondary | ICD-10-CM | POA: Diagnosis not present

## 2021-06-23 DIAGNOSIS — Z111 Encounter for screening for respiratory tuberculosis: Secondary | ICD-10-CM

## 2021-06-23 DIAGNOSIS — L4050 Arthropathic psoriasis, unspecified: Secondary | ICD-10-CM | POA: Diagnosis not present

## 2021-06-23 DIAGNOSIS — Z87828 Personal history of other (healed) physical injury and trauma: Secondary | ICD-10-CM

## 2021-06-23 DIAGNOSIS — Z8659 Personal history of other mental and behavioral disorders: Secondary | ICD-10-CM

## 2021-06-23 DIAGNOSIS — L409 Psoriasis, unspecified: Secondary | ICD-10-CM | POA: Diagnosis not present

## 2021-06-23 DIAGNOSIS — Z8719 Personal history of other diseases of the digestive system: Secondary | ICD-10-CM

## 2021-06-23 DIAGNOSIS — M5134 Other intervertebral disc degeneration, thoracic region: Secondary | ICD-10-CM | POA: Diagnosis not present

## 2021-06-23 NOTE — Patient Instructions (Signed)
Standing Labs ?We placed an order today for your standing lab work.  ? ?Please have your standing labs drawn in July and every 3 months  ? ?If possible, please have your labs drawn 2 weeks prior to your appointment so that the provider can discuss your results at your appointment. ? ?Please note that you may see your imaging and lab results in MyChart before we have reviewed them. ?We may be awaiting multiple results to interpret others before contacting you. ?Please allow our office up to 72 hours to thoroughly review all of the results before contacting the office for clarification of your results. ? ?We have open lab daily: ?Monday through Thursday from 1:30-4:30 PM and Friday from 1:30-4:00 PM ?at the office of Dr. Shaili Deveshwar, [facility].   ?Please be advised, all patients with office appointments requiring lab work will take precedent over walk-in lab work.  ?If possible, please come for your lab work on Monday and Friday afternoons, as you may experience shorter wait times. ?The office is located at [address], [address], [place], [place] [address] ?No appointment is necessary.   ?Labs are drawn by [facility]. Please bring your co-pay at the time of your lab draw.  You may receive a bill from [facility] for your lab work. ? ?Please note if you are on Hydroxychloroquine and and an order has been placed for a Hydroxychloroquine level, you will need to have it drawn 4 hours or more after your last dose. ? ?If you wish to have your labs drawn at another location, please call the office 24 hours in advance to send orders. ? ?If you have any questions regarding directions or hours of operation,  ?please call [phone].   ?As a reminder, please drink plenty of water prior to coming for your lab work. Thanks! ? ?

## 2021-06-24 ENCOUNTER — Ambulatory Visit (INDEPENDENT_AMBULATORY_CARE_PROVIDER_SITE_OTHER): Payer: BC Managed Care – PPO | Admitting: Cardiovascular Disease

## 2021-06-24 ENCOUNTER — Encounter: Payer: Self-pay | Admitting: Cardiovascular Disease

## 2021-06-24 VITALS — BP 126/90 | HR 61 | Ht 72.0 in | Wt 232.4 lb

## 2021-06-24 DIAGNOSIS — R000 Tachycardia, unspecified: Secondary | ICD-10-CM | POA: Diagnosis not present

## 2021-06-24 NOTE — Patient Instructions (Signed)
Medication Instructions:  °The current medical regimen is effective;  continue present plan and medications. ° °*If you need a refill on your cardiac medications before your next appointment, please call your pharmacy* ° ° °Lab Work: °TSH today  ° °If you have labs (blood work) drawn today and your tests are completely normal, you will receive your results only by: °• MyChart Message (if you have MyChart) OR °• A paper copy in the mail °If you have any lab test that is abnormal or we need to change your treatment, we will call you to review the results. ° °Follow-Up: °At [facility], you and your health needs are our priority.  As part of our continuing mission to provide you with exceptional heart care, we have created designated Provider Care Teams.  These Care Teams include your primary Cardiologist (physician) and Advanced Practice Providers (APPs -  Physician Assistants and Nurse Practitioners) who all work together to provide you with the care you need, when you need it. ° °We recommend signing up for the patient portal called "MyChart".  Sign up information is provided on this After Visit Summary.  MyChart is used to connect with patients for Virtual Visits (Telemedicine).  Patients are able to view lab/test results, encounter notes, upcoming appointments, etc.  Non-urgent messages can be sent to your provider as well.   °To learn more about what you can do with MyChart, go to [email or web address].   ° °Your next appointment:   °As needed ° °The format for your next appointment:   °In Person ° °Provider:   °Greenwood O'Neal, MD ° ° ° ° °

## 2021-06-25 LAB — TSH: TSH: 2.47 u[IU]/mL (ref 0.450–4.500)

## 2021-07-02 ENCOUNTER — Other Ambulatory Visit (HOSPITAL_COMMUNITY): Payer: Self-pay

## 2021-07-10 ENCOUNTER — Other Ambulatory Visit (HOSPITAL_COMMUNITY): Payer: Self-pay

## 2021-07-28 ENCOUNTER — Other Ambulatory Visit (HOSPITAL_COMMUNITY): Payer: Self-pay

## 2021-08-07 ENCOUNTER — Other Ambulatory Visit (HOSPITAL_COMMUNITY): Payer: Self-pay

## 2021-08-11 ENCOUNTER — Other Ambulatory Visit (HOSPITAL_COMMUNITY): Payer: Self-pay

## 2021-08-11 ENCOUNTER — Other Ambulatory Visit: Payer: Self-pay | Admitting: Physician Assistant

## 2021-08-11 MED ORDER — COSENTYX SENSOREADY (300 MG) 150 MG/ML ~~LOC~~ SOAJ
SUBCUTANEOUS | 0 refills | Status: DC
  Filled 2021-08-18: qty 2, 28d supply, fill #0

## 2021-08-11 NOTE — Telephone Encounter (Signed)
Next Visit: 12/09/2021  Last Visit: 06/23/2021  Last Fill: 05/08/2021  GH:WEXHBZJIR arthritis   Current Dose per office note 06/23/2021: Cosentyx 150 mg sq injections once every 14 days.  Labs: 05/12/2021 CBC is normal.  ALT is elevated.    TB Gold: 09/03/2020 Neg    Patient advised he is due to update labs and states he will try to update them this week.   Okay to refill Cosentyx?

## 2021-08-15 ENCOUNTER — Other Ambulatory Visit: Payer: Self-pay | Admitting: *Deleted

## 2021-08-15 ENCOUNTER — Other Ambulatory Visit: Payer: Self-pay | Admitting: Pharmacist

## 2021-08-15 ENCOUNTER — Telehealth: Payer: Self-pay | Admitting: Pharmacist

## 2021-08-15 DIAGNOSIS — Z79899 Other long term (current) drug therapy: Secondary | ICD-10-CM

## 2021-08-15 DIAGNOSIS — Z111 Encounter for screening for respiratory tuberculosis: Secondary | ICD-10-CM

## 2021-08-15 NOTE — Telephone Encounter (Signed)
Submitted a Prior Authorization RENEWAL request to [facility] for COSENTYX via fax. Will update once we receive a response.  Fax: [phone] Phone: [phone] Case ID # [id number]

## 2021-08-18 ENCOUNTER — Other Ambulatory Visit (HOSPITAL_COMMUNITY): Payer: Self-pay

## 2021-08-18 LAB — CBC WITH DIFFERENTIAL/PLATELET
Absolute Monocytes: 728 cells/uL (ref 200–950)
Basophils Absolute: 39 cells/uL (ref 0–200)
Basophils Relative: 0.6 %
Eosinophils Absolute: 202 cells/uL (ref 15–500)
Eosinophils Relative: 3.1 %
HCT: 43.3 % (ref 38.5–50.0)
Hemoglobin: 14.7 g/dL (ref 13.2–17.1)
Lymphs Abs: 2132 cells/uL (ref 850–3900)
MCH: 30.9 pg (ref 27.0–33.0)
MCHC: 33.9 g/dL (ref 32.0–36.0)
MCV: 91.2 fL (ref 80.0–100.0)
MPV: 9.4 fL (ref 7.5–12.5)
Monocytes Relative: 11.2 %
Neutro Abs: 3400 cells/uL (ref 1500–7800)
Neutrophils Relative %: 52.3 %
Platelets: 286 10*3/uL (ref 140–400)
RBC: 4.75 10*6/uL (ref 4.20–5.80)
RDW: 12.7 % (ref 11.0–15.0)
Total Lymphocyte: 32.8 %
WBC: 6.5 10*3/uL (ref 3.8–10.8)

## 2021-08-18 LAB — COMPLETE METABOLIC PANEL WITH GFR
AG Ratio: 1.8 (calc) (ref 1.0–2.5)
ALT: 41 U/L (ref 9–46)
AST: 18 U/L (ref 10–40)
Albumin: 4.6 g/dL (ref 3.6–5.1)
Alkaline phosphatase (APISO): 51 U/L (ref 36–130)
BUN: 12 mg/dL (ref 7–25)
CO2: 29 mmol/L (ref 20–32)
Calcium: 9.7 mg/dL (ref 8.6–10.3)
Chloride: 102 mmol/L (ref 98–110)
Creat: 1.27 mg/dL (ref 0.60–1.29)
Globulin: 2.6 g/dL (calc) (ref 1.9–3.7)
Glucose, Bld: 99 mg/dL (ref 65–139)
Potassium: 4.3 mmol/L (ref 3.5–5.3)
Sodium: 138 mmol/L (ref 135–146)
Total Bilirubin: 0.4 mg/dL (ref 0.2–1.2)
Total Protein: 7.2 g/dL (ref 6.1–8.1)
eGFR: 72 mL/min/{1.73_m2} (ref >=60)

## 2021-08-18 LAB — QUANTIFERON-TB GOLD PLUS
Mitogen-NIL: 8.4 IU/mL
NIL: 0.02 IU/mL
QuantiFERON-TB Gold Plus: NEGATIVE
TB1-NIL: 0 IU/mL
TB2-NIL: 0 IU/mL

## 2021-08-18 NOTE — Telephone Encounter (Signed)
Received notification from [facility] regarding a prior authorization for COSENTYX. Authorization has been APPROVED from 08/16/21 to 08/16/22.   Patient can continue to fill through [facility]: [phone]   Authorization # [id number]  Chesley Mires, PharmD, MPH, BCPS, CPP Clinical Pharmacist (Rheumatology and Pulmonology)

## 2021-08-19 NOTE — Progress Notes (Signed)
CBC and CMP are normal.

## 2021-08-19 NOTE — Progress Notes (Signed)
TB gold negative

## 2021-09-01 ENCOUNTER — Other Ambulatory Visit: Payer: Self-pay | Admitting: Physician Assistant

## 2021-09-01 ENCOUNTER — Other Ambulatory Visit (HOSPITAL_COMMUNITY): Payer: Self-pay

## 2021-09-02 ENCOUNTER — Other Ambulatory Visit (HOSPITAL_COMMUNITY): Payer: Self-pay

## 2021-09-02 MED ORDER — COSENTYX SENSOREADY (300 MG) 150 MG/ML ~~LOC~~ SOAJ
SUBCUTANEOUS | 2 refills | Status: DC
  Filled 2021-09-02: qty 2, fill #0
  Filled 2021-09-10: qty 2, 28d supply, fill #0
  Filled 2021-10-13: qty 2, 28d supply, fill #1
  Filled 2021-11-05: qty 2, 28d supply, fill #2

## 2021-09-02 NOTE — Telephone Encounter (Signed)
Next Visit: 11/7/72023  Last Visit: 06/23/2021  Last Fill: 08/11/2021 (30 day supply)  DX: Psoriatic arthritis   Current Dose per office note 06/23/2021: Cosentyx 150 mg sq injections once every 14 days.  Labs: 08/15/2021 CBC and CMP are normal.  TB Gold: 08/15/2021 Neg    Okay to refill Cosentyx?

## 2021-09-10 ENCOUNTER — Other Ambulatory Visit (HOSPITAL_COMMUNITY): Payer: Self-pay

## 2021-09-18 ENCOUNTER — Other Ambulatory Visit (HOSPITAL_COMMUNITY): Payer: Self-pay

## 2021-10-13 ENCOUNTER — Other Ambulatory Visit (HOSPITAL_COMMUNITY): Payer: Self-pay

## 2021-10-16 ENCOUNTER — Other Ambulatory Visit (HOSPITAL_COMMUNITY): Payer: Self-pay

## 2021-10-22 ENCOUNTER — Telehealth: Payer: Self-pay | Admitting: *Deleted

## 2021-10-22 NOTE — Telephone Encounter (Signed)
Labs received from:[facility]  Drawn on:10/13/2021  Reviewed by:Hazel Sams, PA-C  Labs drawn:Hgb A1C, Lipid Panel, CBC, CMP, UA,   Results:Glucose 128   Potassium 5.4   Hgb A1C 6.2   Chol/HDL Ratio 5.23   Cholesterol 225   LDL 144   LDL/HDL Ratio 3.3   Non- HDL Ratio 182  Triglycerides 190   BUN/Creat. Ratio 5.2   BUN 6  Patient is on Cosentyx 150 mg every 14 days.

## 2021-11-05 ENCOUNTER — Other Ambulatory Visit (HOSPITAL_COMMUNITY): Payer: Self-pay

## 2021-11-11 ENCOUNTER — Other Ambulatory Visit (HOSPITAL_COMMUNITY): Payer: Self-pay

## 2021-12-03 ENCOUNTER — Other Ambulatory Visit (HOSPITAL_COMMUNITY): Payer: Self-pay

## 2021-12-03 ENCOUNTER — Other Ambulatory Visit: Payer: Self-pay | Admitting: Physician Assistant

## 2021-12-03 MED ORDER — COSENTYX SENSOREADY (300 MG) 150 MG/ML ~~LOC~~ SOAJ
SUBCUTANEOUS | 2 refills | Status: DC
  Filled 2021-12-03: qty 2, 28d supply, fill #0
  Filled 2021-12-29: qty 2, 28d supply, fill #1
  Filled 2022-01-29: qty 2, 28d supply, fill #2

## 2021-12-03 NOTE — Telephone Encounter (Signed)
Next Visit: 01/05/2022   Last Visit: 06/23/2021   Last Fill: 09/02/2021  DX: Psoriatic arthritis    Current Dose per office note 06/23/2021: Cosentyx 150 mg sq injections once every 14 days.   Labs: 10/13/2021 Glucose 128   Potassium 5.4   Hgb A1C 6.2   Chol/HDL Ratio 5.23   Cholesterol 225   LDL 144   LDL/HDL Ratio 3.3   Non- HDL Ratio 182  Triglycerides 190   BUN/Creat. Ratio 5.2   BUN 6  TB Gold: 08/15/2021 Neg     Okay to refill Cosentyx?

## 2021-12-04 ENCOUNTER — Other Ambulatory Visit (HOSPITAL_COMMUNITY): Payer: Self-pay

## 2021-12-09 ENCOUNTER — Ambulatory Visit: Payer: BC Managed Care – PPO | Admitting: Rheumatology

## 2021-12-22 NOTE — Progress Notes (Signed)
Office Visit Note  Patient: Jacob Massey             Date of Birth: 03/30/1980           MRN: [id number]             PCP: Deland Pretty, MD Referring: Deland Pretty, MD Visit Date: 01/05/2022 Occupation: @GUAROCC @  Subjective:  Medication monitoring   History of Present Illness: Jacob Massey is a 41 y.o. male with history of psoriatic arthritis and DDD.  Patient is currently prescribed Cosentyx 150 mg sq injections once every 14 days.  He continues to tolerate Cosentyx without any side effects.  He reports that he skipped his dose of Cosentyx last Monday due to the concern for a possible cold but his symptoms have resolved and he plans on resuming Cosentyx today.  He denies any signs or symptoms of a psoriatic arthritis flare.  He has not been experiencing any nocturnal pain.  His morning stiffness only lasts a few minutes daily.  He denies any Achilles tendinitis or plantar fasciitis at this time.  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  He currently rates his lower back discomfort a 3 out of 10.  He has no symptoms of a radiculopathy.  He denies any recent or recurrent infections.      Activities of Daily Living:  Patient reports morning stiffness for 3 minutes.   Patient Denies nocturnal pain.  Difficulty dressing/grooming: Denies Difficulty climbing stairs: Reports Difficulty getting out of chair: Denies Difficulty using hands for taps, buttons, cutlery, and/or writing: Reports  Review of Systems  Constitutional:  Positive for fatigue.  HENT:  Negative for mouth sores and mouth dryness.   Eyes:  Negative for dryness.  Respiratory:  Negative for shortness of breath.   Cardiovascular:  Negative for chest pain and palpitations.  Gastrointestinal:  Positive for constipation. Negative for blood in stool and diarrhea.  Endocrine: Positive for increased urination.  Genitourinary:  Negative  for involuntary urination.  Musculoskeletal:  Positive for joint pain, joint pain, joint swelling and morning stiffness. Negative for gait problem, myalgias, muscle weakness, muscle tenderness and myalgias.  Skin:  Negative for color change, rash, hair loss and sensitivity to sunlight.  Allergic/Immunologic: Negative for susceptible to infections.  Neurological:  Negative for dizziness and headaches.  Hematological:  Negative for swollen glands.  Psychiatric/Behavioral:  Positive for sleep disturbance. Negative for depressed mood. The patient is nervous/anxious.     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    Past Medical History:  Diagnosis Date   ADHD 12/03/2015   Bicipital tendinitis 12/03/2015   Genital herpes 12/03/2015   GERD (gastroesophageal reflux disease) 12/03/2015   Hypertension 12/03/2015   Psoriasis 12/03/2015   Psoriatic arthritis ([place]) 12/03/2015   Psoriatic arthritis ([place])    Sinusitis    Tendonitis     Family History  Problem Relation Age of Onset   Arthritis  Mother    Diabetes Mother        borderline    Arrhythmia Mother    Heart attack Father    Cancer Father    ADD / ADHD Daughter    Heart attack Maternal Grandfather    Past Surgical History:  Procedure Laterality Date   KNEE ARTHROSCOPY Left    VASECTOMY     Social History   Social History Narrative   Not on file    There is no immunization history on file for  this patient.   Objective: Vital Signs: BP (!) 140/94 (BP Location: Left Arm, Patient Position: Sitting, Cuff Size: Normal)   Pulse 65   Resp 15   Ht 6' 0.5" (1.842 m)   Wt 236 lb (107 kg)   BMI 31.57 kg/m    Physical Exam Vitals and nursing note reviewed.  Constitutional:      Appearance: He is well-developed.  HENT:     Head: Normocephalic and atraumatic.  Eyes:     Conjunctiva/sclera: Conjunctivae normal.     Pupils: Pupils are equal, round, and reactive to light.  Cardiovascular:     Rate and Rhythm: Normal rate and regular rhythm.     Heart sounds: Normal heart sounds.  Pulmonary:     Effort: Pulmonary effort is normal.     Breath sounds: Normal breath sounds.  Abdominal:     General: Bowel sounds are normal.     Palpations: Abdomen is soft.  Musculoskeletal:     Cervical back: Normal range of motion and neck supple.  Skin:    General: Skin is warm and dry.     Capillary Refill: Capillary refill takes less than 2 seconds.  Neurological:     Mental Status: He is alert and oriented to person, place, and time.  Psychiatric:        Behavior: Behavior normal.      Musculoskeletal Exam: C-spine, thoracic spine, and lumbar spine good ROM.  Some discomfort in the lumbar spine.  Mild SI joint tenderness.  Shoulder joints, elbow joints, wrist joints, MCPs, PIPs, DIPs have good range of motion with no synovitis.  Complete fist formation bilaterally.  Hip joints have good range of motion with no groin pain.  Knee joints have good range of motion with no warmth or effusion.  Ankle joints have good range of motion with no tenderness or joint swelling.  No evidence of Achilles tendinitis or plantar fasciitis.  CDAI Exam: CDAI Score: -- Patient Global: --; Provider Global: -- Swollen: --; Tender: -- Joint Exam 01/05/2022   No joint exam has been documented for this visit   There is currently no information documented on the homunculus. Go to the Rheumatology activity and  complete the homunculus joint exam.  Investigation: No additional findings.  Imaging: No results found.  Recent Labs: Lab Results  Component Value Date   WBC 6.5 08/15/2021   HGB 14.7 08/15/2021   PLT 286 08/15/2021   NA 138 08/15/2021   K 4.3 08/15/2021   CL 102 08/15/2021   CO2 29 08/15/2021   GLUCOSE 99 08/15/2021   BUN 12 08/15/2021   CREATININE 1.27 08/15/2021   BILITOT 0.4 08/15/2021   ALKPHOS 55 08/17/2016   AST 18 08/15/2021   ALT 41 08/15/2021   PROT 7.2 08/15/2021   ALBUMIN 4.4 08/17/2016   CALCIUM 9.7 08/15/2021   GFRAA 105 05/01/2020   QFTBGOLD NEGATIVE 10/19/2016   QFTBGOLDPLUS NEGATIVE 08/15/2021    Speciality Comments: Prior therapy: Humira  and Enbrel (inadequate response) MTX- fatigue  Procedures:  No procedures performed Allergies: Adderall [amphetamine-dextroamphetamine] and Methotrexate derivatives   Assessment / Plan:     Visit Diagnoses: Psoriatic arthritis ([place]) - Ultrasound of the right hand performed on 03/29/2018 negative for synovitis: He has no synovitis or dactylitis on examination today.  He has not had any signs or symptoms of a psoriatic arthritis flare.  He has no active psoriasis at this time.  No evidence of Achilles tendinitis or plantar fasciitis.  Mild SI joint tenderness noted bilaterally.  No symptoms of radiculopathy.  Overall he has clinically been doing well on Cosentyx 150 mg subcutaneous injections every 14 days.  He tolerates Cosentyx without any side effects or injection site reactions.  He has not had any recurrent infections.  He will remain on Cosentyx as monotherapy.  He was advised to notify us if he develops signs or symptoms of a flare.  He will follow-up in the office in 5 months or sooner if needed.  Psoriasis: He has no active psoriasis at this time.  High risk medication use - Cosentyx 150 mg sq injections once every 14 days.  TB gold negative on 08/15/21.  CBC and CMP drawn on 10/13/21.  Orders released today.  His  next lab work will be due in March and every 3 months to monitor for drug toxicity. He has not had any recent or recurrent infections.  Discussed the importance of holding Cosentyx if he develops signs or symptoms of infection and to resume once the infection has completely cleared. - Plan: COMPLETE METABOLIC PANEL WITH GFR, CBC with Differential/Platelet  DDD (degenerative disc disease), thoracic: No midline spinal tenderness.  Spondylosis of lumbar region without myelopathy or radiculopathy: He experiences intermittent discomfort in his lower back.  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  He sees a Restaurant manager, fast food on a regular basis.  History of meniscal tear - left knee-He experiences intermittent discomfort and stiffness in the left knee.  On examination today he had no warmth or effusion.  Other medical conditions are listed as follows:  History of gastroesophageal reflux (GERD)  History of hypertension: Blood pressure was 143/90 today in the office.  Blood pressure was rechecked in the office.  He was encouraged to monitor his blood pressure closely.  History of ADHD  History of depression  Orders: Orders Placed This Encounter  Procedures   COMPLETE METABOLIC PANEL WITH GFR   CBC with Differential/Platelet   No orders of the defined types were placed in this encounter.   Follow-Up Instructions: Return in about 5 months (around 06/06/2022) for Psoriatic arthritis, DDD.   Ofilia Neas, PA-C  Note - This record has been created using Dragon software.  Chart creation errors have been sought, but may not always  have been located. Such creation errors do not reflect on  the standard of medical care.

## 2021-12-29 ENCOUNTER — Other Ambulatory Visit (HOSPITAL_COMMUNITY): Payer: Self-pay

## 2022-01-05 ENCOUNTER — Ambulatory Visit: Payer: BC Managed Care – PPO | Attending: Rheumatology | Admitting: Physician Assistant

## 2022-01-05 ENCOUNTER — Encounter: Payer: Self-pay | Admitting: Physician Assistant

## 2022-01-05 VITALS — BP 140/94 | HR 65 | Resp 15 | Ht 72.5 in | Wt 236.0 lb

## 2022-01-05 DIAGNOSIS — Z87828 Personal history of other (healed) physical injury and trauma: Secondary | ICD-10-CM

## 2022-01-05 DIAGNOSIS — Z8719 Personal history of other diseases of the digestive system: Secondary | ICD-10-CM

## 2022-01-05 DIAGNOSIS — M5134 Other intervertebral disc degeneration, thoracic region: Secondary | ICD-10-CM | POA: Diagnosis not present

## 2022-01-05 DIAGNOSIS — M47816 Spondylosis without myelopathy or radiculopathy, lumbar region: Secondary | ICD-10-CM

## 2022-01-05 DIAGNOSIS — L409 Psoriasis, unspecified: Secondary | ICD-10-CM

## 2022-01-05 DIAGNOSIS — Z79899 Other long term (current) drug therapy: Secondary | ICD-10-CM | POA: Diagnosis not present

## 2022-01-05 DIAGNOSIS — Z8659 Personal history of other mental and behavioral disorders: Secondary | ICD-10-CM

## 2022-01-05 DIAGNOSIS — Z8679 Personal history of other diseases of the circulatory system: Secondary | ICD-10-CM

## 2022-01-05 DIAGNOSIS — L4050 Arthropathic psoriasis, unspecified: Secondary | ICD-10-CM

## 2022-01-05 NOTE — Patient Instructions (Signed)
Standing Labs We placed an order today for your standing lab work.   Please have your standing labs drawn in March and every 3 months   Please have your labs drawn 2 weeks prior to your appointment so that the provider can discuss your lab results at your appointment.  Please note that you may see your imaging and lab results in MyChart before we have reviewed them. We will contact you once all results are reviewed. Please allow our office up to 72 hours to thoroughly review all of the results before contacting the office for clarification of your results.  Lab hours are:   Monday through Thursday from 8:00 am -12:30 pm and 1:00 pm-5:00 pm and Friday from 8:00 am-12:00 pm.  Please be advised, all patients with office appointments requiring lab work will take precedent over walk-in lab work.   Labs are drawn by [facility]. Please bring your co-pay at the time of your lab draw.  You may receive a bill from [facility] for your lab work.  Please note if you are on Hydroxychloroquine and and an order has been placed for a Hydroxychloroquine level, you will need to have it drawn 4 hours or more after your last dose.  If you wish to have your labs drawn at another location, please call the office 24 hours in advance so we can fax the orders.  The office is located at [address], [address], [place], [place] [address] No appointment is necessary.    If you have any questions regarding directions or hours of operation,  please call [phone].   As a reminder, please drink plenty of water prior to coming for your lab work. Thanks!  If you have signs or symptoms of an infection or start antibiotics: First, call your PCP for workup of your infection. Hold your medication through the infection, until you complete your antibiotics, and until symptoms resolve if you take the following: Injectable medication (Actemra, Benlysta, Cimzia, Cosentyx, Enbrel, Humira, Kevzara, Orencia, Remicade, Simponi,  Stelara, Taltz, Tremfya) Methotrexate Leflunomide (Arava) Mycophenolate (Cellcept) Xeljanz, Olumiant, or Rinvoq   Vaccines You are taking a medication(s) that can suppress your immune system.  The following immunizations are recommended: Flu annually Covid-19  Td/Tdap (tetanus, diphtheria, pertussis) every 10 years Pneumonia (Prevnar 15 then Pneumovax 23 at least 1 year apart.  Alternatively, can take Prevnar 20 without needing additional dose) Shingrix: 2 doses from 4 weeks to 6 months apart  Please check with your PCP to make sure you are up to date.   

## 2022-01-06 ENCOUNTER — Other Ambulatory Visit (HOSPITAL_COMMUNITY): Payer: Self-pay

## 2022-01-06 LAB — CBC WITH DIFFERENTIAL/PLATELET
Absolute Monocytes: 440 cells/uL (ref 200–950)
Basophils Absolute: 70 cells/uL (ref 0–200)
Basophils Relative: 1.4 %
Eosinophils Absolute: 290 cells/uL (ref 15–500)
Eosinophils Relative: 5.8 %
HCT: 43.8 % (ref 38.5–50.0)
Hemoglobin: 15.4 g/dL (ref 13.2–17.1)
Lymphs Abs: 1710 cells/uL (ref 850–3900)
MCH: 31.4 pg (ref 27.0–33.0)
MCHC: 35.2 g/dL (ref 32.0–36.0)
MCV: 89.2 fL (ref 80.0–100.0)
MPV: 9.4 fL (ref 7.5–12.5)
Monocytes Relative: 8.8 %
Neutro Abs: 2490 cells/uL (ref 1500–7800)
Neutrophils Relative %: 49.8 %
Platelets: 353 10*3/uL (ref 140–400)
RBC: 4.91 10*6/uL (ref 4.20–5.80)
RDW: 12.9 % (ref 11.0–15.0)
Total Lymphocyte: 34.2 %
WBC: 5 10*3/uL (ref 3.8–10.8)

## 2022-01-06 LAB — COMPLETE METABOLIC PANEL WITH GFR
AG Ratio: 1.5 (calc) (ref 1.0–2.5)
ALT: 50 U/L — ABNORMAL HIGH (ref 9–46)
AST: 22 U/L (ref 10–40)
Albumin: 4.5 g/dL (ref 3.6–5.1)
Alkaline phosphatase (APISO): 61 U/L (ref 36–130)
BUN: 11 mg/dL (ref 7–25)
CO2: 29 mmol/L (ref 20–32)
Calcium: 9.7 mg/dL (ref 8.6–10.3)
Chloride: 102 mmol/L (ref 98–110)
Creat: 1.07 mg/dL (ref 0.60–1.29)
Globulin: 3 g/dL (calc) (ref 1.9–3.7)
Glucose, Bld: 121 mg/dL — ABNORMAL HIGH (ref 65–99)
Potassium: 4.8 mmol/L (ref 3.5–5.3)
Sodium: 139 mmol/L (ref 135–146)
Total Bilirubin: 0.3 mg/dL (ref 0.2–1.2)
Total Protein: 7.5 g/dL (ref 6.1–8.1)
eGFR: 88 mL/min/{1.73_m2} (ref >=60)

## 2022-01-06 NOTE — Progress Notes (Signed)
CBC WNL.  Glucose is 121. ALT is borderline elevated but stable. Avoid the use of tylenol and alcohol use.

## 2022-01-29 ENCOUNTER — Other Ambulatory Visit (HOSPITAL_COMMUNITY): Payer: Self-pay

## 2022-02-10 ENCOUNTER — Other Ambulatory Visit: Payer: Self-pay

## 2022-03-04 ENCOUNTER — Other Ambulatory Visit (HOSPITAL_COMMUNITY): Payer: Self-pay

## 2022-03-06 ENCOUNTER — Other Ambulatory Visit: Payer: Self-pay | Admitting: Rheumatology

## 2022-03-06 ENCOUNTER — Other Ambulatory Visit (HOSPITAL_COMMUNITY): Payer: Self-pay

## 2022-03-06 MED ORDER — COSENTYX SENSOREADY (300 MG) 150 MG/ML ~~LOC~~ SOAJ
SUBCUTANEOUS | 2 refills | Status: DC
  Filled 2022-03-06: qty 2, 28d supply, fill #0
  Filled 2022-04-10: qty 2, 28d supply, fill #1
  Filled 2022-04-29: qty 2, 28d supply, fill #2

## 2022-03-06 NOTE — Telephone Encounter (Signed)
Next Visit: 06/08/2022  Last Visit: 01/05/2022  Last Fill: 12/03/2021  QT:[id number] arthritis   Current Dose per office note 01/05/2022: Cosentyx 150 mg sq injections once every 14 days.    Labs: 01/05/2022 CBC WNL. Glucose is 121. ALT is borderline elevated but stable.   TB Gold: 08/15/2021 Neg    Okay to refill Cosentyx?

## 2022-03-09 ENCOUNTER — Other Ambulatory Visit: Payer: Self-pay

## 2022-03-26 ENCOUNTER — Other Ambulatory Visit (HOSPITAL_COMMUNITY): Payer: Self-pay

## 2022-04-08 ENCOUNTER — Other Ambulatory Visit (HOSPITAL_COMMUNITY): Payer: Self-pay

## 2022-04-09 ENCOUNTER — Other Ambulatory Visit (HOSPITAL_COMMUNITY): Payer: Self-pay

## 2022-04-10 ENCOUNTER — Other Ambulatory Visit (HOSPITAL_COMMUNITY): Payer: Self-pay

## 2022-04-29 ENCOUNTER — Other Ambulatory Visit (HOSPITAL_COMMUNITY): Payer: Self-pay

## 2022-05-06 ENCOUNTER — Other Ambulatory Visit (HOSPITAL_COMMUNITY): Payer: Self-pay

## 2022-05-25 NOTE — Progress Notes (Signed)
Office Visit Note  Patient: Jacob Massey             Date of Birth: 31-May-1980           MRN: [id number]             PCP: Merri Brunette, MD Referring: Merri Brunette, MD Visit Date: 06/08/2022 Occupation: @GUAROCC @  Subjective:  Medication monitoring   History of Present Illness: Jacob Massey is a 42 y.o. male with history of psoriatic arthritis and osteoarthritis.  Patient remains on Cosentyx 150 mg sq injections once every 14 days.  He is tolerating Cosentyx without any side effects or injection site reactions.  He has not missed any doses of Cosentyx recently.  His next dose of Cosentyx is due this coming weekend.  Patient states he continues to have SI joint pain bilaterally but states that his symptoms have been unchanged.  He denies any Achilles tendinitis or plantar fasciitis.  He has been experiencing some stiffness in both hands as well as stiffness after sitting for prolonged periods of time.  He has not been taking any over-the-counter products for pain relief.  He denies any psoriasis at this time.  He denies any recent or recurrent infections.  He states over the past several weeks he has been experiencing a migratory burning sensation in his skin.  He states that the burning sensation has been occurring on a daily basis and migrates from upper and lower extremities.  He currently has a burning patch on his right shin.  He has not seen neurology in the past.  He denies any new medications around the time of symptom onset.      Activities of Daily Living:  Patient reports morning stiffness for 10 minutes.   Patient Denies nocturnal pain.  Difficulty dressing/grooming: Denies Difficulty climbing stairs: Denies Difficulty getting out of chair: Denies Difficulty using hands for taps, buttons, cutlery, and/or writing: Denies  Review of Systems  Constitutional:  Positive for fatigue.  HENT:  Positive for mouth dryness. Negative for mouth sores.   Eyes:  Negative for dryness.   Respiratory:  Negative for shortness of breath.   Cardiovascular:  Negative for chest pain and palpitations.  Gastrointestinal:  Positive for constipation and diarrhea. Negative for blood in stool.  Endocrine: Positive for increased urination.  Genitourinary:  Negative for involuntary urination.  Musculoskeletal:  Positive for joint pain, joint pain, joint swelling and morning stiffness. Negative for gait problem, myalgias, muscle weakness, muscle tenderness and myalgias.  Skin:  Negative for color change, rash, hair loss and sensitivity to sunlight.  Allergic/Immunologic: Negative for susceptible to infections.  Neurological:  Positive for light-headedness. Negative for dizziness and headaches.  Hematological:  Negative for swollen glands.  Psychiatric/Behavioral:  Positive for sleep disturbance. Negative for depressed mood. The patient is not nervous/anxious.     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    Past Medical History:  Diagnosis Date   ADHD 12/03/2015   Bicipital tendinitis 12/03/2015   Genital herpes  12/03/2015   GERD (gastroesophageal reflux disease) 12/03/2015   History of diabetes mellitus, type II    per patient   Hypertension 12/03/2015   Psoriasis 12/03/2015   Psoriatic arthritis (HCC) 12/03/2015   Psoriatic arthritis (HCC)    Sinusitis    Tendonitis     Family History  Problem Relation Age of Onset   Arthritis Mother     Diabetes Mother        borderline    Arrhythmia Mother    Heart attack Father    Cancer Father    ADD / ADHD Daughter    Heart attack Maternal Grandfather    Past Surgical History:  Procedure Laterality Date   KNEE ARTHROSCOPY Left    VASECTOMY     Social History   Social History Narrative   Not on file    There is no immunization history on file for this patient.   Objective: Vital Signs: BP (!) 136/95 (BP Location: Left Arm, Patient Position: Sitting, Cuff Size: Normal)   Pulse 68   Resp 16   Ht 6\' 1"  (1.854 m)   Wt 230 lb (104.3 kg)   BMI 30.34 kg/m    Physical Exam Vitals and nursing note reviewed.  Constitutional:      Appearance: He is well-developed.  HENT:     Head: Normocephalic and atraumatic.  Eyes:     Conjunctiva/sclera: Conjunctivae normal.     Pupils: Pupils are equal, round, and reactive to light.  Cardiovascular:     Rate and Rhythm: Normal rate and regular rhythm.     Heart sounds: Normal heart sounds.  Pulmonary:     Effort: Pulmonary effort is normal.     Breath sounds: Normal breath sounds.  Abdominal:     General: Bowel sounds are normal.     Palpations: Abdomen is soft.  Musculoskeletal:     Cervical back: Normal range of motion and neck supple.  Skin:    General: Skin is warm and dry.     Capillary Refill: Capillary refill takes less than 2 seconds.  Neurological:     Mental Status: He is alert and oriented to person, place, and time.  Psychiatric:        Behavior: Behavior normal.      Musculoskeletal Exam: C-spine, thoracic spine, and lumbar spine good ROM.  No midline spinal tenderness.  Tenderness over both SI joints.  Shoulder joints, elbow joints, wrist joints, Mcps, PIPs, and DIPs good ROM with no synovitis.  Complete fist formation bilaterally.  Hip joints have good ROM with no groin pain.  Knee joints have good ROM with no warmth or effusion.  Ankle joints have good ROM with no tenderness or joint swelling.  No evidence of  achilles tendonitis or plantar fasciitis.   CDAI Exam: CDAI Score: -- Patient Global: --; Provider Global: -- Swollen: --; Tender: -- Joint Exam 06/08/2022   No joint exam has been documented for this visit   There is currently no information documented on the homunculus. Go to the Rheumatology activity and complete the homunculus joint exam.  Investigation: No additional findings.  Imaging: No results found.  Recent Labs: Lab Results  Component Value Date   WBC 5.0 01/05/2022   HGB 15.4 01/05/2022   PLT 353 01/05/2022   NA 139 01/05/2022   K 4.8 01/05/2022   CL 102 01/05/2022   CO2 29 01/05/2022   GLUCOSE 121 (H) 01/05/2022   BUN 11 01/05/2022   CREATININE 1.07 01/05/2022  BILITOT 0.3 01/05/2022   ALKPHOS 55 08/17/2016   AST 22 01/05/2022   ALT 50 (H) 01/05/2022   PROT 7.5 01/05/2022   ALBUMIN 4.4 08/17/2016   CALCIUM 9.7 01/05/2022   GFRAA 105 05/01/2020   QFTBGOLD NEGATIVE 10/19/2016   QFTBGOLDPLUS NEGATIVE 08/15/2021    Speciality Comments: Prior therapy: Humira and Enbrel (inadequate response) MTX- fatigue  Procedures:  No procedures performed Allergies: Adderall [amphetamine-dextroamphetamine] and Methotrexate derivatives   Assessment / Plan:     Visit Diagnoses: Psoriatic arthritis (HCC) - Ultrasound of the right hand performed on 03/29/2018 negative for synovitis: He has no synovitis or dactylitis on examination today.  He has not had any signs or symptoms of any sort of arthritis flare recently.  No active psoriasis at this time.  No evidence of Achilles tendinitis or plantar fasciitis.  He continues to have chronic pain in both SI joints and has minimal tenderness upon palpation today.  He remains on Cosentyx 150 mg subcutaneous injections every 14 days.  He has not missed any doses of Cosentyx recently.  He has not had any recent or recurrent infections.  He experiences joint stiffness especially after sitting for prolonged periods of time.  He states  that while at work he sits for 12 to 13 hours/day but on the weekends he is able to be more active with the stiffness subsides.  He will remain on Cosentyx as prescribed.  He is advised to notify us if he develops signs or symptoms of a flare.  He will follow up in 5 months or sooner if needed.   Psoriasis: No active psoriasis at this time.   High risk medication use - Cosentyx 150 mg sq injections once every 14 days.  CBC and CMP updated on 01/05/22. Orders for CBC and CMP released today.  His next lab work will be due in August and every 3 months.  TB gold negative on 08/15/21. Future order placed today.  No recent or recurrent infections. Discussed the importance of holding cosentyx if he develops signs or symptoms of an infection and to resume once the infection has completely cleared.  - Plan: CBC with Differential/Platelet, COMPLETE METABOLIC PANEL WITH GFR, QuantiFERON-TB Gold Plus, CBC with Differential/Platelet, COMPLETE METABOLIC PANEL WITH GFR  Screening for tuberculosis -Future order for TB gold released today.  Plan: QuantiFERON-TB Gold Plus  DDD (degenerative disc disease), thoracic: No midline spinal tenderness.   Spondylosis of lumbar region without myelopathy or radiculopathy: No midline spinal tenderness at this time.   History of meniscal tear - left knee-No warmth or effusion noted today.   Paresthesia of skin: Patchy, migratory-upper and lower extremities occurring on a daily basis for the past few weeks. No identifiable trigger.  No medication changes. No muscle weakness.  A referral to neurology will be placed today for further evaluation.   Other medical conditions are listed as follows:   History of hypertension: BP was 136/95 today in the office.   History of gastroesophageal reflux (GERD)  History of ADHD  History of depression    Orders: Orders Placed This Encounter  Procedures   CBC with Differential/Platelet   COMPLETE METABOLIC PANEL WITH GFR    QuantiFERON-TB Gold Plus   CBC with Differential/Platelet   COMPLETE METABOLIC PANEL WITH GFR   Meds ordered this encounter  Medications   Secukinumab, 300 MG Dose, (COSENTYX SENSOREADY, 300 MG,) 150 MG/ML SOAJ    Sig: INJECT 150 MG (1 PEN) INTO THE SKIN EVERY 14 (FOURTEEN) DAYS.  Dispense:  2 mL    Refill:  2     Follow-Up Instructions: Return in about 5 months (around 11/08/2022) for Psoriatic arthritis, Osteoarthritis.   Gearldine Bienenstock, PA-C  Note - This record has been created using Dragon software.  Chart creation errors have been sought, but may not always  have been located. Such creation errors do not reflect on  the standard of medical care.

## 2022-06-02 ENCOUNTER — Other Ambulatory Visit (HOSPITAL_COMMUNITY): Payer: Self-pay

## 2022-06-03 ENCOUNTER — Other Ambulatory Visit: Payer: Self-pay | Admitting: Physician Assistant

## 2022-06-03 ENCOUNTER — Other Ambulatory Visit (HOSPITAL_COMMUNITY): Payer: Self-pay

## 2022-06-05 ENCOUNTER — Other Ambulatory Visit (HOSPITAL_COMMUNITY): Payer: Self-pay

## 2022-06-08 ENCOUNTER — Other Ambulatory Visit: Payer: Self-pay

## 2022-06-08 ENCOUNTER — Other Ambulatory Visit (HOSPITAL_COMMUNITY): Payer: Self-pay

## 2022-06-08 ENCOUNTER — Ambulatory Visit: Payer: BC Managed Care – PPO | Attending: Physician Assistant | Admitting: Physician Assistant

## 2022-06-08 ENCOUNTER — Encounter: Payer: Self-pay | Admitting: Physician Assistant

## 2022-06-08 VITALS — BP 136/95 | HR 68 | Resp 16 | Ht 73.0 in | Wt 230.0 lb

## 2022-06-08 DIAGNOSIS — M5134 Other intervertebral disc degeneration, thoracic region: Secondary | ICD-10-CM

## 2022-06-08 DIAGNOSIS — Z8719 Personal history of other diseases of the digestive system: Secondary | ICD-10-CM

## 2022-06-08 DIAGNOSIS — Z111 Encounter for screening for respiratory tuberculosis: Secondary | ICD-10-CM

## 2022-06-08 DIAGNOSIS — M47816 Spondylosis without myelopathy or radiculopathy, lumbar region: Secondary | ICD-10-CM

## 2022-06-08 DIAGNOSIS — Z8659 Personal history of other mental and behavioral disorders: Secondary | ICD-10-CM

## 2022-06-08 DIAGNOSIS — L4050 Arthropathic psoriasis, unspecified: Secondary | ICD-10-CM

## 2022-06-08 DIAGNOSIS — L409 Psoriasis, unspecified: Secondary | ICD-10-CM

## 2022-06-08 DIAGNOSIS — Z79899 Other long term (current) drug therapy: Secondary | ICD-10-CM | POA: Diagnosis not present

## 2022-06-08 DIAGNOSIS — Z8679 Personal history of other diseases of the circulatory system: Secondary | ICD-10-CM

## 2022-06-08 DIAGNOSIS — Z87828 Personal history of other (healed) physical injury and trauma: Secondary | ICD-10-CM

## 2022-06-08 DIAGNOSIS — R202 Paresthesia of skin: Secondary | ICD-10-CM

## 2022-06-08 MED ORDER — COSENTYX SENSOREADY (300 MG) 150 MG/ML ~~LOC~~ SOAJ
SUBCUTANEOUS | 2 refills | Status: DC
  Filled 2022-06-08: qty 2, 28d supply, fill #0
  Filled 2022-07-02: qty 2, 28d supply, fill #1
  Filled 2022-07-27: qty 2, 28d supply, fill #2

## 2022-06-08 NOTE — Patient Instructions (Signed)
Standing Labs We placed an order today for your standing lab work.   Please have your standing labs drawn in August and every 3 months   Please have your labs drawn 2 weeks prior to your appointment so that the provider can discuss your lab results at your appointment, if possible.  Please note that you may see your imaging and lab results in MyChart before we have reviewed them. We will contact you once all results are reviewed. Please allow our office up to 72 hours to thoroughly review all of the results before contacting the office for clarification of your results.  WALK-IN LAB HOURS  Monday through Thursday from 8:00 am -12:30 pm and 1:00 pm-5:00 pm and Friday from 8:00 am-12:00 pm.  Patients with office visits requiring labs will be seen before walk-in labs.  You may encounter longer than normal wait times. Please allow additional time. Wait times may be shorter on  Monday and Thursday afternoons.  We do not book appointments for walk-in labs. We appreciate your patience and understanding with our staff.   Labs are drawn by [facility]. Please bring your co-pay at the time of your lab draw.  You may receive a bill from [facility] for your lab work.  Please note if you are on Hydroxychloroquine and and an order has been placed for a Hydroxychloroquine level,  you will need to have it drawn 4 hours or more after your last dose.  If you wish to have your labs drawn at another location, please call the office 24 hours in advance so we can fax the orders.  The office is located at [address], [address], [place], [place] [address]   If you have any questions regarding directions or hours of operation,  please call [phone].   As a reminder, please drink plenty of water prior to coming for your lab work. Thanks!  

## 2022-06-08 NOTE — Addendum Note (Signed)
Addended by: Geroge Baseman C on: 06/08/2022 11:45 AM   Modules accepted: Orders

## 2022-06-09 LAB — COMPLETE METABOLIC PANEL WITH GFR
AG Ratio: 1.5 (calc) (ref 1.0–2.5)
ALT: 47 U/L — ABNORMAL HIGH (ref 9–46)
AST: 19 U/L (ref 10–40)
Albumin: 4.5 g/dL (ref 3.6–5.1)
Alkaline phosphatase (APISO): 53 U/L (ref 36–130)
BUN: 12 mg/dL (ref 7–25)
CO2: 28 mmol/L (ref 20–32)
Calcium: 10.1 mg/dL (ref 8.6–10.3)
Chloride: 104 mmol/L (ref 98–110)
Creat: 1.15 mg/dL (ref 0.60–1.29)
Globulin: 3.1 g/dL (calc) (ref 1.9–3.7)
Glucose, Bld: 92 mg/dL (ref 65–99)
Potassium: 5.2 mmol/L (ref 3.5–5.3)
Sodium: 139 mmol/L (ref 135–146)
Total Bilirubin: 0.4 mg/dL (ref 0.2–1.2)
Total Protein: 7.6 g/dL (ref 6.1–8.1)
eGFR: 80 mL/min/{1.73_m2} (ref >=60)

## 2022-06-09 LAB — CBC WITH DIFFERENTIAL/PLATELET
Absolute Monocytes: 617 cells/uL (ref 200–950)
Basophils Absolute: 63 cells/uL (ref 0–200)
Basophils Relative: 1 %
Eosinophils Absolute: 221 cells/uL (ref 15–500)
Eosinophils Relative: 3.5 %
HCT: 46.4 % (ref 38.5–50.0)
Hemoglobin: 15.8 g/dL (ref 13.2–17.1)
Lymphs Abs: 1985 cells/uL (ref 850–3900)
MCH: 30 pg (ref 27.0–33.0)
MCHC: 34.1 g/dL (ref 32.0–36.0)
MCV: 88.2 fL (ref 80.0–100.0)
MPV: 9.1 fL (ref 7.5–12.5)
Monocytes Relative: 9.8 %
Neutro Abs: 3415 cells/uL (ref 1500–7800)
Neutrophils Relative %: 54.2 %
Platelets: 343 10*3/uL (ref 140–400)
RBC: 5.26 10*6/uL (ref 4.20–5.80)
RDW: 13 % (ref 11.0–15.0)
Total Lymphocyte: 31.5 %
WBC: 6.3 10*3/uL (ref 3.8–10.8)

## 2022-06-09 NOTE — Progress Notes (Signed)
ALT remains borderline elevated but has improved-47.  AST WNL. Rest of CMP WNL.   CBC WNL.

## 2022-07-01 ENCOUNTER — Other Ambulatory Visit: Payer: Self-pay

## 2022-07-02 ENCOUNTER — Other Ambulatory Visit (HOSPITAL_COMMUNITY): Payer: Self-pay

## 2022-07-02 ENCOUNTER — Other Ambulatory Visit: Payer: Self-pay

## 2022-07-07 ENCOUNTER — Other Ambulatory Visit (HOSPITAL_COMMUNITY): Payer: Self-pay

## 2022-07-20 ENCOUNTER — Telehealth: Payer: Self-pay

## 2022-07-20 NOTE — Telephone Encounter (Signed)
Submitted a Prior Authorization renewal request to [facility] for COSENTYX via fax. Will update once we receive a response.  Fax: [phone] Phone: [phone] Case #: [id number]

## 2022-07-21 NOTE — Telephone Encounter (Signed)
Received notification from [facility] regarding a prior authorization for COSENTYX. Authorization has been APPROVED from 07/21/22 to 07/21/23. Approval letter sent to scan center.  Authorization # [id number]  Chesley Mires, PharmD, MPH, BCPS, CPP Clinical Pharmacist (Rheumatology and Pulmonology)

## 2022-07-27 ENCOUNTER — Other Ambulatory Visit: Payer: Self-pay

## 2022-08-03 ENCOUNTER — Other Ambulatory Visit (HOSPITAL_COMMUNITY): Payer: Self-pay

## 2022-08-25 ENCOUNTER — Other Ambulatory Visit (HOSPITAL_COMMUNITY): Payer: Self-pay

## 2022-08-27 ENCOUNTER — Other Ambulatory Visit: Payer: Self-pay

## 2022-08-27 ENCOUNTER — Other Ambulatory Visit (HOSPITAL_COMMUNITY): Payer: Self-pay

## 2022-08-27 ENCOUNTER — Other Ambulatory Visit: Payer: Self-pay | Admitting: Physician Assistant

## 2022-08-27 MED ORDER — COSENTYX SENSOREADY (300 MG) 150 MG/ML ~~LOC~~ SOAJ
SUBCUTANEOUS | 0 refills | Status: DC
  Filled 2022-08-27: qty 2, 28d supply, fill #0

## 2022-08-27 NOTE — Telephone Encounter (Signed)
Last Fill: 06/08/2022  Labs: 06/08/2022 ALT remains borderline elevated but has improved-47.  AST WNL. Rest of CMP WNL.  CBC WNL.  TB Gold: 08/15/2021 Neg    Next Visit: 12/02/2022  Last Visit: 06/08/2022  DX: Psoriatic arthritis   Current Dose per office note 06/08/2022: Cosentyx 150 mg sq injections once every 14 days.   Patient advised he is due to update labs. Patient states he will come in to update when he can work it out with work.   Okay to refill Cosentyx?

## 2022-08-28 ENCOUNTER — Other Ambulatory Visit: Payer: Self-pay | Admitting: *Deleted

## 2022-08-28 DIAGNOSIS — Z79899 Other long term (current) drug therapy: Secondary | ICD-10-CM

## 2022-08-28 DIAGNOSIS — Z111 Encounter for screening for respiratory tuberculosis: Secondary | ICD-10-CM

## 2022-08-28 LAB — CBC WITH DIFFERENTIAL/PLATELET
Absolute Monocytes: 663 cells/uL (ref 200–950)
Basophils Absolute: 52 cells/uL (ref 0–200)
Basophils Relative: 0.8 %
Eosinophils Absolute: 260 cells/uL (ref 15–500)
Eosinophils Relative: 4 %
HCT: 44.6 % (ref 38.5–50.0)
Hemoglobin: 15 g/dL (ref 13.2–17.1)
Lymphs Abs: 2477 cells/uL (ref 850–3900)
MCH: 30.1 pg (ref 27.0–33.0)
MCHC: 33.6 g/dL (ref 32.0–36.0)
MCV: 89.6 fL (ref 80.0–100.0)
MPV: 9.6 fL (ref 7.5–12.5)
Monocytes Relative: 10.2 %
Neutro Abs: 3049 cells/uL (ref 1500–7800)
Platelets: 334 10*3/uL (ref 140–400)
RBC: 4.98 10*6/uL (ref 4.20–5.80)
RDW: 13 % (ref 11.0–15.0)
Total Lymphocyte: 38.1 %
WBC: 6.5 10*3/uL (ref 3.8–10.8)

## 2022-08-29 LAB — CBC WITH DIFFERENTIAL/PLATELET: Neutrophils Relative %: 46.9 %

## 2022-08-30 NOTE — Progress Notes (Signed)
CBC and CMP WNL

## 2022-08-31 ENCOUNTER — Other Ambulatory Visit: Payer: Self-pay

## 2022-09-01 ENCOUNTER — Other Ambulatory Visit (HOSPITAL_COMMUNITY): Payer: Self-pay

## 2022-09-01 NOTE — Progress Notes (Signed)
TB gold negative

## 2022-09-15 ENCOUNTER — Ambulatory Visit (INDEPENDENT_AMBULATORY_CARE_PROVIDER_SITE_OTHER): Payer: BC Managed Care – PPO | Admitting: Diagnostic Neuroimaging

## 2022-09-15 ENCOUNTER — Encounter: Payer: Self-pay | Admitting: Diagnostic Neuroimaging

## 2022-09-15 VITALS — BP 132/92 | HR 70 | Ht 72.0 in | Wt 226.0 lb

## 2022-09-15 DIAGNOSIS — R202 Paresthesia of skin: Secondary | ICD-10-CM | POA: Diagnosis not present

## 2022-09-15 NOTE — Patient Instructions (Signed)
  MIGRATORY PARESTHESIAS (mild, intermittent; likely benign paresthesias) - check B12, A1c per PCP (check up in Sept 2024)

## 2022-09-15 NOTE — Progress Notes (Signed)
[facility]  PATIENT: Jacob Massey DOB: 1980/04/06  REFERRING CLINICIAN: Gearldine Bienenstock, PA-C HISTORY FROM: patient  REASON FOR VISIT: new consult   HISTORICAL  CHIEF COMPLAINT:  Chief Complaint  Patient presents with   New Patient (Initial Visit)    Rm 7, here alone Pt is here for burning sensation that occurs on his arms and legs randomly, it has been happening for 2 months. Pt also states he has sciatic nerve pain that radiates down right leg.     HISTORY OF PRESENT ILLNESS:   42 year old male here for evaluation of intermittent burning sensation.  Symptoms can affect various areas randomly, arms, legs, right or left side, intermittently up to 30 seconds at a time.  Symptoms started around April 2024.  No specific triggering or aggravating factors.  No weakness.  No vision speech or swallowing issues.  Has had some low back pain rating to the right leg in the past (? Sciatica).   REVIEW OF SYSTEMS: Full 14 system review of systems performed and negative with exception of: as per HPI.  ALLERGIES: Allergies  Allergen Reactions   Adderall [Amphetamine-Dextroamphetamine]     Various intolerable mouth sensitivities but not swelling   Methotrexate Derivatives Rash    Itching and red spots    HOME MEDICATIONS: Outpatient Medications Prior to Visit  Medication Sig Dispense Refill   amphetamine-dextroamphetamine (ADDERALL) 20 MG tablet Take 20 mg by mouth 3 (three) times daily.     guanFACINE (INTUNIV) 4 MG TB24 ER tablet Take 4 mg by mouth every morning.     losartan (COZAAR) 100 MG tablet Take 100 mg by mouth daily.     metFORMIN (GLUCOPHAGE-XR) 750 MG 24 hr tablet Take 750 mg by mouth daily.     OZEMPIC, 0.25 OR 0.5 MG/DOSE, 2 MG/3ML SOPN SMARTSIG:0.5 Milligram(s) SUB-Q Every 4 Weeks     pantoprazole (PROTONIX) 40 MG tablet Take 40 mg by mouth daily.     Secukinumab, 300 MG Dose, (COSENTYX SENSOREADY, 300 MG,) 150 MG/ML SOAJ INJECT 150 MG (1 PEN)  INTO THE SKIN EVERY 14 (FOURTEEN) DAYS. 2 mL 0   aspirin EC 81 MG tablet Take 81 mg by mouth daily. Swallow whole. (Patient not taking: Reported on 03/03/2021)     Dextroamphetamine Sulfate 20 MG TABS Take by mouth. (Patient not taking: Reported on 03/03/2021)     guanFACINE (INTUNIV) 2 MG TB24 ER tablet Take 4 mg by mouth every morning. (Patient not taking: Reported on 01/05/2022)     losartan (COZAAR) 50 MG tablet Take by mouth daily. (Patient not taking: Reported on 01/05/2022)     No facility-administered medications prior to visit.    PAST MEDICAL HISTORY: Past Medical History:  Diagnosis Date   ADHD 12/03/2015   Bicipital tendinitis 12/03/2015   Genital herpes 12/03/2015   GERD (gastroesophageal reflux disease) 12/03/2015   History of diabetes mellitus, type II    per patient   Hypertension 12/03/2015   Psoriasis 12/03/2015   Psoriatic arthritis (HCC) 12/03/2015   Psoriatic arthritis (HCC)    Sinusitis    Tendonitis     PAST SURGICAL HISTORY: Past Surgical History:  Procedure Laterality Date   KNEE ARTHROSCOPY Left    VASECTOMY      FAMILY HISTORY: Family History  Problem Relation Age of Onset   Arthritis Mother    Diabetes Mother        borderline    Arrhythmia Mother    Heart attack Father    Cancer Father  ADD / ADHD Daughter    Heart attack Maternal Grandfather     SOCIAL HISTORY: Social History   Socioeconomic History   Marital status: Married    Spouse name: 3   Number of children: Not on file   Years of education: Not on file   Highest education level: Not on file  Occupational History   Not on file  Tobacco Use   Smoking status: Former    Current packs/day: 0.00    Average packs/day: 1 pack/day for 5.0 years (5.0 ttl pk-yrs)    Types: Cigarettes    Start date: 45    Quit date: 2004    Years since quitting: 20.6    Passive exposure: Never   Smokeless tobacco: Never  Vaping Use   Vaping status: Never Used  Substance and Sexual Activity    Alcohol use: Yes    Comment: occ   Drug use: No   Sexual activity: Not on file  Other Topics Concern   Not on file  Social History Narrative   Not on file   Social Determinants of Health   Financial Resource Strain: Not on file  Food Insecurity: Not on file  Transportation Needs: Not on file  Physical Activity: Not on file  Stress: Not on file  Social Connections: Not on file  Intimate Partner Violence: Not on file     PHYSICAL EXAM  GENERAL EXAM/CONSTITUTIONAL: Vitals:  Vitals:   09/15/22 0947  BP: (!) 132/92  Pulse: 70  Weight: 226 lb (102.5 kg)  Height: 6' (1.829 m)   Body mass index is 30.65 kg/m. Wt Readings from Last 3 Encounters:  09/15/22 226 lb (102.5 kg)  06/08/22 230 lb (104.3 kg)  01/05/22 236 lb (107 kg)   Patient is in no distress; well developed, nourished and groomed; neck is supple  CARDIOVASCULAR: Examination of carotid arteries is normal; no carotid bruits Regular rate and rhythm, no murmurs Examination of peripheral vascular system by observation and palpation is normal  EYES: Ophthalmoscopic exam of optic discs and posterior segments is normal; no papilledema or hemorrhages No results found.  MUSCULOSKELETAL: Gait, strength, tone, movements noted in Neurologic exam below  NEUROLOGIC: MENTAL STATUS:      No data to display         awake, alert, oriented to person, place and time recent and remote memory intact normal attention and concentration language fluent, comprehension intact, naming intact fund of knowledge appropriate  CRANIAL NERVE:  2nd - no papilledema on fundoscopic exam 2nd, 3rd, 4th, 6th - pupils equal and reactive to light, visual fields full to confrontation, extraocular muscles intact, no nystagmus 5th - facial sensation symmetric 7th - facial strength symmetric 8th - hearing intact 9th - palate elevates symmetrically, uvula midline 11th - shoulder shrug symmetric 12th - tongue protrusion  midline  MOTOR:  normal bulk and tone, full strength in the BUE, BLE  SENSORY:  normal and symmetric to light touch, temperature, vibration  COORDINATION:  finger-nose-finger, fine finger movements normal  REFLEXES:  deep tendon reflexes 1+ and symmetric  GAIT/STATION:  narrow based gait     DIAGNOSTIC DATA (LABS, IMAGING, TESTING) - I reviewed patient records, labs, notes, testing and imaging myself where available.  Lab Results  Component Value Date   WBC 6.5 08/28/2022   HGB 15.0 08/28/2022   HCT 44.6 08/28/2022   MCV 89.6 08/28/2022   PLT 334 08/28/2022      Component Value Date/Time   NA 139 08/28/2022 1116  NA 137 10/17/2015 0000   K 4.9 08/28/2022 1116   CL 103 08/28/2022 1116   CO2 26 08/28/2022 1116   GLUCOSE 93 08/28/2022 1116   BUN 15 08/28/2022 1116   BUN 8 10/17/2015 0000   CREATININE 1.20 08/28/2022 1116   CALCIUM 9.9 08/28/2022 1116   PROT 7.4 08/28/2022 1116   ALBUMIN 4.4 08/17/2016 0850   AST 19 08/28/2022 1116   ALT 36 08/28/2022 1116   ALKPHOS 55 08/17/2016 0850   BILITOT 0.4 08/28/2022 1116   GFRNONAA 91 05/01/2020 1120   GFRAA 105 05/01/2020 1120   No results found for: "CHOL", "HDL", "LDLCALC", "LDLDIRECT", "TRIG", "CHOLHDL" No results found for: "HGBA1C" No results found for: "VITAMINB12" Lab Results  Component Value Date   TSH 2.470 06/24/2021     ASSESSMENT AND PLAN  42 y.o. year old male here with intermittent migratory paresthesias since April 2024 affecting arms or legs.  Neurologic examination unremarkable.  History of starting arthritis on therapy.   Dx:  1. Paresthesias       PLAN:  MILD MIGRATORY PARESTHESIAS (mild, intermittent; likely benign paresthesias) - check B12, A1c per PCP (check up in Sept 2024) - monitor symptoms; if worsening or failing to improve over next few months, may consider additional testing  Return for return to referring provider, pending if symptoms worsen or fail to  improve.    Suanne Marker, MD 09/15/2022, 2:06 PM Certified in Neurology, Neurophysiology and Neuroimaging  [facility] [address] [place], [place] [address] [phone]

## 2022-09-23 ENCOUNTER — Other Ambulatory Visit: Payer: Self-pay

## 2022-09-23 ENCOUNTER — Other Ambulatory Visit: Payer: Self-pay | Admitting: Physician Assistant

## 2022-09-23 ENCOUNTER — Other Ambulatory Visit (HOSPITAL_COMMUNITY): Payer: Self-pay

## 2022-09-23 NOTE — Telephone Encounter (Signed)
Last Fill: 08/27/2022 (30 day supply)  Labs: 08/28/2022 CBC and CMP WNL   TB Gold: 08/28/2022 Neg   Next Visit: 12/02/2022  Last Visit: 06/08/2022  DX: Psoriatic arthritis   Current Dose per office note 06/08/2022: Cosentyx 150 mg sq injections once every 14 days.   Okay to refill Cosentyx?

## 2022-09-24 ENCOUNTER — Other Ambulatory Visit (HOSPITAL_COMMUNITY): Payer: Self-pay

## 2022-09-24 ENCOUNTER — Other Ambulatory Visit: Payer: Self-pay

## 2022-09-24 MED ORDER — COSENTYX SENSOREADY (300 MG) 150 MG/ML ~~LOC~~ SOAJ
SUBCUTANEOUS | 2 refills | Status: DC
  Filled 2022-09-24: qty 2, 28d supply, fill #0
  Filled 2022-11-10: qty 2, 28d supply, fill #1
  Filled 2022-12-01: qty 2, 28d supply, fill #2

## 2022-09-29 ENCOUNTER — Other Ambulatory Visit (HOSPITAL_COMMUNITY): Payer: Self-pay

## 2022-10-20 ENCOUNTER — Other Ambulatory Visit (HOSPITAL_COMMUNITY): Payer: Self-pay

## 2022-10-26 ENCOUNTER — Other Ambulatory Visit: Payer: Self-pay

## 2022-10-28 ENCOUNTER — Other Ambulatory Visit: Payer: Self-pay

## 2022-10-30 ENCOUNTER — Other Ambulatory Visit: Payer: Self-pay

## 2022-11-10 ENCOUNTER — Other Ambulatory Visit: Payer: Self-pay

## 2022-11-10 NOTE — Progress Notes (Signed)
Specialty Pharmacy Refill Coordination Note  Jacob Massey is a 42 y.o. male contacted today regarding refills of specialty medication(s) Secukinumab   Patient requested Delivery   Delivery date: 11/12/22   Verified address: [address], [place] [address]   Medication will be filled on 11/11/22.

## 2022-11-11 ENCOUNTER — Other Ambulatory Visit: Payer: Self-pay

## 2022-11-18 NOTE — Progress Notes (Signed)
Office Visit Note  Patient: Jacob Massey             Date of Birth: [birth year]           MRN: [id number]             PCP: Merri Brunette, MD Referring: Merri Brunette, MD Visit Date: 12/02/2022 Occupation: @GUAROCC @  Subjective:  Patient monitoring  History of Present Illness: Jacob Massey is a 42 y.o. male with psoriatic arthritis, psoriasis and osteoarthritis.  He states he had mild flare of psoriasis on his face for which she has topical agents which she has not started yet.  He continues to have some stiffness in his joints but no joint inflammation.  He recently had some sciatica symptoms for which she has been seeing a [facility].  He denies any episodes of Achilles tendinitis, plantar fasciitis, dactylitis or iritis.  He has been taking Cosentyx 150 mg subcu every 14 days without any interruption.  He denies any side effects to the medication.    Activities of Daily Living:  Patient reports morning stiffness for 10-15 minutes.   Patient Denies nocturnal pain.  Difficulty dressing/grooming: Denies Difficulty climbing stairs: Denies Difficulty getting out of chair: Reports Difficulty using hands for taps, buttons, cutlery, and/or writing: Reports  Review of Systems  Constitutional:  Positive for fatigue.  HENT:  Positive for mouth dryness. Negative for mouth sores.   Eyes:  Negative for dryness.  Respiratory:  Negative for shortness of breath.   Cardiovascular:  Negative for chest pain and palpitations.  Gastrointestinal:  Positive for blood in stool, constipation and diarrhea.       History of hemorrhoids  Endocrine: Positive for increased urination.  Genitourinary:  Negative for involuntary urination.  Musculoskeletal:  Positive for joint pain, joint pain and morning stiffness. Negative for gait problem, joint swelling, myalgias, muscle weakness, muscle tenderness and myalgias.  Skin:  Positive for rash. Negative for color change, hair loss and sensitivity to  sunlight.  Allergic/Immunologic: Negative for susceptible to infections.  Neurological:  Negative for dizziness and headaches.  Hematological:  Negative for swollen glands.  Psychiatric/Behavioral:  Positive for sleep disturbance. Negative for depressed mood. The patient is nervous/anxious.     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    Past Medical History:  Diagnosis Date   ADHD 12/03/2015   Bicipital tendinitis 12/03/2015   Genital herpes 12/03/2015   GERD (gastroesophageal reflux disease) 12/03/2015   History of diabetes mellitus, type II    per patient   Hypertension 12/03/2015   Psoriasis 12/03/2015   Psoriatic arthritis (HCC) 12/03/2015   Psoriatic arthritis (HCC)    Sinusitis    Tendonitis     Family History  Problem Relation Age of Onset   Arthritis Mother    Diabetes Mother        borderline    Arrhythmia Mother    Heart attack Father    Cancer Father    ADD / ADHD Daughter    Heart attack  Maternal Grandfather    Past Surgical History:  Procedure Laterality Date   KNEE ARTHROSCOPY Left    VASECTOMY     Social History   Social History Narrative   Not on file    There is no immunization history on file for this patient.   Objective: Vital Signs: BP 128/86 (BP Location: Left Arm, Patient Position: Sitting, Cuff Size: Normal)   Pulse 73   Resp 14   Ht 6' 0.5" (1.842 m)   Wt 229 lb (103.9 kg)   BMI 30.63 kg/m     Physical Exam Vitals and nursing note reviewed.  Constitutional:      Appearance: He is well-developed.  HENT:     Head: Normocephalic and atraumatic.  Eyes:     Conjunctiva/sclera: Conjunctivae normal.     Pupils: Pupils are equal, round, and reactive to light.  Cardiovascular:     Rate and Rhythm: Normal rate and regular rhythm.     Heart sounds: Normal heart sounds.  Pulmonary:     Effort: Pulmonary effort is normal.     Breath sounds: Normal breath sounds.  Abdominal:     General: Bowel sounds are normal.     Palpations: Abdomen is soft.  Musculoskeletal:     Cervical back: Normal range of motion and neck supple.  Skin:    General: Skin is warm and dry.     Capillary Refill: Capillary refill takes less than 2 seconds.     Comments: Few psoriasis patches were noted on glabella and around the nostrils.  Neurological:     Mental Status: He is alert and oriented to person, place, and time.  Psychiatric:        Behavior: Behavior normal.      Musculoskeletal Exam: Cervical, thoracic and lumbar spine were in good range of motion.  He had some discomfort range of motion of the lumbar spine.  Shoulder joints, elbow joints, wrist joints, MCPs PIPs and DIPs were in good range of motion with no synovitis.  Hip joints, knee joints, ankles, MTPs and PIPs were in good range of motion with no synovitis.  No dactylitis or plantar fasciitis was noted.  CDAI Exam: CDAI Score: -- Patient Global: --; Provider Global: -- Swollen: --; Tender: -- Joint Exam 12/02/2022   No joint exam has been documented for this visit   There is currently no information documented on the homunculus. Go to the Rheumatology activity and complete the homunculus joint exam.  Investigation: No additional findings.  Imaging: No results found.  Recent Labs: Lab Results  Component Value Date   WBC 6.5 08/28/2022   HGB 15.0 08/28/2022   PLT 334 08/28/2022   NA 139 08/28/2022   K 4.9 08/28/2022   CL  103 08/28/2022   CO2 26 08/28/2022   GLUCOSE 93 08/28/2022   BUN 15 08/28/2022   CREATININE 1.20 08/28/2022   BILITOT 0.4 08/28/2022   ALKPHOS 55 08/17/2016   AST 19 08/28/2022   ALT 36 08/28/2022   PROT 7.4 08/28/2022   ALBUMIN 4.4 08/17/2016   CALCIUM 9.9 08/28/2022   GFRAA 105 05/01/2020   QFTBGOLD NEGATIVE 10/19/2016   QFTBGOLDPLUS NEGATIVE 08/28/2022    Speciality Comments: Prior therapy: Humira and Enbrel (inadequate response) MTX- fatigue  Procedures:  No procedures performed Allergies: Adderall [amphetamine-dextroamphetamine] and Methotrexate derivatives   Assessment / Plan:     Visit Diagnoses: Psoriatic arthritis (HCC) - Ultrasound of the right hand performed on 03/29/2018 negative for synovitis: He has been doing well  on Cosentyx 150 mg subcu every 14 days.  No synovitis or dactylitis was noted.  There is no history of recent plantar fasciitis or Achilles tendinitis.  He has intermittent SI joint pain.  He is currently on Cosentyx 150 mg subcu every 14 days without any interruption.  Psoriasis-he has a breakout of psoriasis on his face.  He is not using any topical agents.  He states he has some topical agents at home which she will start.  High risk medication use - Cosentyx 150 mg sq injections once every 14 days. -Labs obtained in July CBC and CMP were normal.  TB Gold was negative in July 2024.  Will get labs today and then every 3 months to monitor for drug toxicity.  Information on immunization was placed in the AVS.  He was advised to hold Cosentyx if he develops an infection and resume after the infection resolves.  Plan: CBC with Differential/Platelet, COMPLETE METABOLIC PANEL WITH GFR  DDD (degenerative disc disease), thoracic-he denies any discomfort in the thoracic region and had no tenderness on palpation.  Spondylosis of lumbar region without myelopathy or radiculopathy-he has been having increased lower back pain.  He states he has been seeing a  [facility].  History of meniscal tear - left knee-he gives history of intermittent discomfort.  No warmth swelling or effusion was noted.  History of hypertension-blood pressure normal at 128/86 today.  History of gastroesophageal reflux (GERD)  History of ADHD  History of depression  Paresthesia of skin - Patchy, migratory-upper and lower extremities.  Patient was evaluated by Dr. Marjory Lies in August 2024.  His neurological exam was unremarkable.  He was advised to return on as needed basis.  Orders: Orders Placed This Encounter  Procedures   CBC with Differential/Platelet   COMPLETE METABOLIC PANEL WITH GFR   No orders of the defined types were placed in this encounter.   Follow-Up Instructions: Return in about 5 months (around 05/02/2023) for Psoriatic arthritis.   Pollyann Savoy, MD  Note - This record has been created using Animal nutritionist.  Chart creation errors have been sought, but may not always  have been located. Such creation errors do not reflect on  the standard of medical care.

## 2022-11-26 ENCOUNTER — Other Ambulatory Visit: Payer: Self-pay

## 2022-12-01 ENCOUNTER — Other Ambulatory Visit (HOSPITAL_COMMUNITY): Payer: Self-pay

## 2022-12-01 NOTE — Progress Notes (Signed)
Specialty Pharmacy Refill Coordination Note  Jacob Massey is a 42 y.o. male contacted today regarding refills of specialty medication(s) Secukinumab   Patient requested Delivery   Delivery date: 12/10/22   Verified address: [address] [place] [address]   Medication will be filled on 12/09/22.

## 2022-12-02 ENCOUNTER — Ambulatory Visit: Payer: BC Managed Care – PPO | Attending: Rheumatology | Admitting: Rheumatology

## 2022-12-02 ENCOUNTER — Encounter: Payer: Self-pay | Admitting: Rheumatology

## 2022-12-02 VITALS — BP 128/86 | HR 73 | Resp 14 | Ht 72.5 in | Wt 229.0 lb

## 2022-12-02 DIAGNOSIS — Z8659 Personal history of other mental and behavioral disorders: Secondary | ICD-10-CM

## 2022-12-02 DIAGNOSIS — R202 Paresthesia of skin: Secondary | ICD-10-CM

## 2022-12-02 DIAGNOSIS — M5134 Other intervertebral disc degeneration, thoracic region: Secondary | ICD-10-CM | POA: Diagnosis not present

## 2022-12-02 DIAGNOSIS — Z79899 Other long term (current) drug therapy: Secondary | ICD-10-CM

## 2022-12-02 DIAGNOSIS — Z8679 Personal history of other diseases of the circulatory system: Secondary | ICD-10-CM

## 2022-12-02 DIAGNOSIS — L4050 Arthropathic psoriasis, unspecified: Secondary | ICD-10-CM | POA: Diagnosis not present

## 2022-12-02 DIAGNOSIS — L409 Psoriasis, unspecified: Secondary | ICD-10-CM | POA: Diagnosis not present

## 2022-12-02 DIAGNOSIS — M47816 Spondylosis without myelopathy or radiculopathy, lumbar region: Secondary | ICD-10-CM

## 2022-12-02 DIAGNOSIS — Z87828 Personal history of other (healed) physical injury and trauma: Secondary | ICD-10-CM

## 2022-12-02 DIAGNOSIS — Z8719 Personal history of other diseases of the digestive system: Secondary | ICD-10-CM

## 2022-12-02 NOTE — Progress Notes (Signed)
Liver functions are elevated.  Patient should avoid all NSAIDs and alcohol use.  Patient admitted to recent alcohol use during the office visit.  Please forward results to his PCP.

## 2022-12-02 NOTE — Patient Instructions (Signed)
Standing Labs We placed an order today for your standing lab work.   Please have your standing labs drawn in January and every 3 months  Please have your labs drawn 2 weeks prior to your appointment so that the provider can discuss your lab results at your appointment, if possible.  Please note that you may see your imaging and lab results in MyChart before we have reviewed them. We will contact you once all results are reviewed. Please allow our office up to 72 hours to thoroughly review all of the results before contacting the office for clarification of your results.  WALK-IN LAB HOURS  Monday through Thursday from 8:00 am -12:30 pm and 1:00 pm-5:00 pm and Friday from 8:00 am-12:00 pm.  Patients with office visits requiring labs will be seen before walk-in labs.  You may encounter longer than normal wait times. Please allow additional time. Wait times may be shorter on  Monday and Thursday afternoons.  We do not book appointments for walk-in labs. We appreciate your patience and understanding with our staff.   Labs are drawn by [facility]. Please bring your co-pay at the time of your lab draw.  You may receive a bill from [facility] for your lab work.  Please note if you are on Hydroxychloroquine and and an order has been placed for a Hydroxychloroquine level,  you will need to have it drawn 4 hours or more after your last dose.  If you wish to have your labs drawn at another location, please call the office 24 hours in advance so we can fax the orders.  The office is located at [address], [place], [place] [address]   If you have any questions regarding directions or hours of operation,  please call [phone].   As a reminder, please drink plenty of water prior to coming for your lab work. Thanks!   Vaccines You are taking a medication(s) that can suppress your immune system.  The following immunizations are recommended: Flu annually Covid-19  RSV Td/Tdap (tetanus,  diphtheria, pertussis) every 10 years Pneumonia (Prevnar 15 then Pneumovax 23 at least 1 year apart.  Alternatively, can take Prevnar 20 without needing additional dose) Shingrix: 2 doses from 4 weeks to 6 months apart  Please check with your PCP to make sure you are up to date.   If you have signs or symptoms of an infection or start antibiotics: First, call your PCP for workup of your infection. Hold your medication through the infection, until you complete your antibiotics, and until symptoms resolve if you take the following: Injectable medication (Actemra, Benlysta, Cimzia, Cosentyx, Enbrel, Humira, Kevzara, Orencia, Remicade, Simponi, Stelara, Taltz, Tremfya) Methotrexate Leflunomide (Arava) Mycophenolate (Cellcept) Harriette Ohara, Olumiant, or Rinvoq

## 2022-12-03 LAB — CBC WITH DIFFERENTIAL/PLATELET
Absolute Lymphocytes: 2301 {cells}/uL (ref 850–3900)
Absolute Monocytes: 553 {cells}/uL (ref 200–950)
Basophils Absolute: 52 {cells}/uL (ref 0–200)
Basophils Relative: 0.8 %
Eosinophils Absolute: 403 {cells}/uL (ref 15–500)
Eosinophils Relative: 6.2 %
HCT: 48 % (ref 38.5–50.0)
Hemoglobin: 16.6 g/dL (ref 13.2–17.1)
MCH: 30.6 pg (ref 27.0–33.0)
MCHC: 34.6 g/dL (ref 32.0–36.0)
MCV: 88.6 fL (ref 80.0–100.0)
MPV: 9.3 fL (ref 7.5–12.5)
Monocytes Relative: 8.5 %
Neutro Abs: 3192 {cells}/uL (ref 1500–7800)
Neutrophils Relative %: 49.1 %
Platelets: 355 10*3/uL (ref 140–400)
RBC: 5.42 10*6/uL (ref 4.20–5.80)
RDW: 13 % (ref 11.0–15.0)
Total Lymphocyte: 35.4 %
WBC: 6.5 10*3/uL (ref 3.8–10.8)

## 2022-12-03 LAB — COMPLETE METABOLIC PANEL WITH GFR
AG Ratio: 1.6 (calc) (ref 1.0–2.5)
ALT: 54 U/L — ABNORMAL HIGH (ref 9–46)
AST: 21 U/L (ref 10–40)
Albumin: 4.9 g/dL (ref 3.6–5.1)
Alkaline phosphatase (APISO): 62 U/L (ref 36–130)
BUN: 13 mg/dL (ref 7–25)
CO2: 30 mmol/L (ref 20–32)
Calcium: 10.3 mg/dL (ref 8.6–10.3)
Chloride: 102 mmol/L (ref 98–110)
Creat: 1.07 mg/dL (ref 0.60–1.29)
Globulin: 3 g/dL (ref 1.9–3.7)
Glucose, Bld: 90 mg/dL (ref 65–99)
Potassium: 4.6 mmol/L (ref 3.5–5.3)
Sodium: 138 mmol/L (ref 135–146)
Total Bilirubin: 0.5 mg/dL (ref 0.2–1.2)
Total Protein: 7.9 g/dL (ref 6.1–8.1)
eGFR: 88 mL/min/{1.73_m2} (ref >=60)

## 2022-12-03 NOTE — Progress Notes (Signed)
CBC is normal.

## 2022-12-09 ENCOUNTER — Other Ambulatory Visit: Payer: Self-pay

## 2022-12-28 ENCOUNTER — Other Ambulatory Visit (HOSPITAL_COMMUNITY): Payer: Self-pay

## 2023-01-01 ENCOUNTER — Other Ambulatory Visit: Payer: Self-pay

## 2023-01-04 ENCOUNTER — Other Ambulatory Visit: Payer: Self-pay

## 2023-01-04 ENCOUNTER — Other Ambulatory Visit (HOSPITAL_COMMUNITY): Payer: Self-pay

## 2023-01-04 ENCOUNTER — Other Ambulatory Visit: Payer: Self-pay | Admitting: Physician Assistant

## 2023-01-04 MED ORDER — COSENTYX SENSOREADY (300 MG) 150 MG/ML ~~LOC~~ SOAJ
SUBCUTANEOUS | 2 refills | Status: DC
  Filled 2023-01-04: qty 2, 28d supply, fill #0
  Filled 2023-02-09: qty 2, 28d supply, fill #1
  Filled 2023-03-09: qty 2, 28d supply, fill #2

## 2023-01-04 NOTE — Progress Notes (Signed)
Specialty Pharmacy Ongoing Clinical Assessment Note  Jacob Massey is a 42 y.o. male who is being followed by the specialty pharmacy service for RxSp Psoriatic Arthritis   Patient's specialty medication(s) reviewed today: Secukinumab   Missed doses in the last 4 weeks: 0   Patient/Caregiver did not have any additional questions or concerns.   Therapeutic benefit summary: Patient is achieving benefit   Adverse events/side effects summary: No adverse events/side effects   Patient's therapy is appropriate to: Continue    Goals Addressed             This Visit's Progress    Minimize recurrence of flares       Patient is on track. Patient will maintain adherence.  Patient reports that he remains reasonably well-controlled at this time.         Follow up:  6 months  Servando Snare Specialty Pharmacist

## 2023-01-04 NOTE — Telephone Encounter (Signed)
Last Fill: 09/24/2022  Labs: 12/02/2022 CBC is normal. Liver functions are elevated.  Patient should avoid all NSAIDs and alcohol use.  Patient admitted to recent alcohol use during the office visit.  Please forward results to his PCP.   TB Gold: 08/28/2022   TB gold negative   Next Visit: 05/05/2023  Last Visit: 12/02/2022   [id number] arthritis   Current Dose per office note 12/02/2022: Cosentyx 150 mg sq injections once every 14 days.   Okay to refill Cosentyx?

## 2023-01-04 NOTE — Progress Notes (Signed)
Specialty Pharmacy Refill Coordination Note  Jacob Massey is a 42 y.o. male contacted today regarding refills of specialty medication(s) Secukinumab   Patient requested Delivery   Delivery date: 01/14/23   Verified address: [address] [place] [address]   Medication will be filled on 01/13/23. *Pending Refill Request*

## 2023-01-13 ENCOUNTER — Other Ambulatory Visit: Payer: Self-pay

## 2023-02-02 ENCOUNTER — Other Ambulatory Visit: Payer: Self-pay

## 2023-02-04 ENCOUNTER — Other Ambulatory Visit: Payer: Self-pay

## 2023-02-08 ENCOUNTER — Other Ambulatory Visit: Payer: Self-pay

## 2023-02-09 ENCOUNTER — Other Ambulatory Visit: Payer: Self-pay

## 2023-02-09 NOTE — Progress Notes (Signed)
 Specialty Pharmacy Refill Coordination Note  Jacob Massey is a 43 y.o. male contacted today regarding refills of specialty medication(s) Secukinumab  (Cosentyx  Sensoready (300 MG))   Patient requested Delivery   Delivery date: 02/12/23   Verified address: [address] [place] [address]   Medication will be filled on 02/11/23.

## 2023-02-10 ENCOUNTER — Other Ambulatory Visit (HOSPITAL_COMMUNITY): Payer: Self-pay

## 2023-02-11 ENCOUNTER — Other Ambulatory Visit: Payer: Self-pay

## 2023-03-09 ENCOUNTER — Other Ambulatory Visit: Payer: Self-pay

## 2023-03-09 ENCOUNTER — Other Ambulatory Visit (HOSPITAL_COMMUNITY): Payer: Self-pay

## 2023-03-09 NOTE — Progress Notes (Signed)
 Specialty Pharmacy Refill Coordination Note  Jacob Massey is a 43 y.o. male contacted today regarding refills of specialty medication(s) Secukinumab  (Cosentyx  Sensoready (300 MG))   Patient requested Delivery   Delivery date: 03/12/23   Verified address: [address] [place]  [id number]   Medication will be filled on 03/11/23.

## 2023-03-11 ENCOUNTER — Other Ambulatory Visit: Payer: Self-pay

## 2023-03-11 ENCOUNTER — Other Ambulatory Visit (HOSPITAL_COMMUNITY): Payer: Self-pay

## 2023-04-06 ENCOUNTER — Other Ambulatory Visit (HOSPITAL_COMMUNITY): Payer: Self-pay

## 2023-04-09 ENCOUNTER — Other Ambulatory Visit (HOSPITAL_COMMUNITY): Payer: Self-pay

## 2023-04-09 ENCOUNTER — Other Ambulatory Visit: Payer: Self-pay

## 2023-04-09 ENCOUNTER — Other Ambulatory Visit: Payer: Self-pay | Admitting: Pharmacy Technician

## 2023-04-09 ENCOUNTER — Other Ambulatory Visit: Payer: Self-pay | Admitting: Physician Assistant

## 2023-04-09 MED ORDER — COSENTYX SENSOREADY (300 MG) 150 MG/ML ~~LOC~~ SOAJ
SUBCUTANEOUS | 0 refills | Status: DC
  Filled 2023-04-09: qty 2, 28d supply, fill #0

## 2023-04-09 NOTE — Progress Notes (Signed)
 Specialty Pharmacy Refill Coordination Note  Jacob Massey is a 43 y.o. male contacted today regarding refills of specialty medication(s) Secukinumab (Cosentyx Sensoready (300 MG))   Patient requested Delivery   Delivery date: 04/14/23   Verified address: [address]  [place]   Medication will be filled on 04/13/23.   RR sent to MD

## 2023-04-09 NOTE — Telephone Encounter (Signed)
 Last Fill: 01/04/2023  Labs: 12/02/2022 Liver functions are elevated. CBC is normal.   TB Gold: 08/28/2022   Next Visit: 05/05/2023  Last Visit: 12/02/2022  [id number] arthritis   Current Dose per office note 12/02/2022: Cosentyx 150 mg sq injections once every 14 days.   Patient advised he is due to update labs. Patient states " I will see when I can get in there".  Okay to refill Cosentyx?

## 2023-04-12 ENCOUNTER — Other Ambulatory Visit: Payer: Self-pay | Admitting: *Deleted

## 2023-04-12 DIAGNOSIS — Z79899 Other long term (current) drug therapy: Secondary | ICD-10-CM

## 2023-04-13 ENCOUNTER — Other Ambulatory Visit: Payer: Self-pay

## 2023-04-13 LAB — CBC WITH DIFFERENTIAL/PLATELET
Absolute Lymphocytes: 2278 {cells}/uL (ref 850–3900)
Absolute Monocytes: 482 {cells}/uL (ref 200–950)
Basophils Absolute: 58 {cells}/uL (ref 0–200)
Basophils Relative: 0.8 %
Eosinophils Absolute: 197 {cells}/uL (ref 15–500)
Eosinophils Relative: 2.7 %
HCT: 47.2 % (ref 38.5–50.0)
Hemoglobin: 15.5 g/dL (ref 13.2–17.1)
MCH: 29.9 pg (ref 27.0–33.0)
MCHC: 32.8 g/dL (ref 32.0–36.0)
MCV: 91.1 fL (ref 80.0–100.0)
MPV: 9.3 fL (ref 7.5–12.5)
Monocytes Relative: 6.6 %
Neutro Abs: 4285 {cells}/uL (ref 1500–7800)
Neutrophils Relative %: 58.7 %
Platelets: 376 10*3/uL (ref 140–400)
RBC: 5.18 10*6/uL (ref 4.20–5.80)
RDW: 12.9 % (ref 11.0–15.0)
Total Lymphocyte: 31.2 %
WBC: 7.3 10*3/uL (ref 3.8–10.8)

## 2023-04-13 LAB — COMPLETE METABOLIC PANEL WITH GFR
AG Ratio: 1.8 (calc) (ref 1.0–2.5)
ALT: 34 U/L (ref 9–46)
AST: 17 U/L (ref 10–40)
Albumin: 4.8 g/dL (ref 3.6–5.1)
Alkaline phosphatase (APISO): 55 U/L (ref 36–130)
BUN: 9 mg/dL (ref 7–25)
CO2: 30 mmol/L (ref 20–32)
Calcium: 10 mg/dL (ref 8.6–10.3)
Chloride: 101 mmol/L (ref 98–110)
Creat: 1.08 mg/dL (ref 0.60–1.29)
Globulin: 2.7 g/dL (ref 1.9–3.7)
Glucose, Bld: 102 mg/dL — ABNORMAL HIGH (ref 65–99)
Potassium: 5.4 mmol/L — ABNORMAL HIGH (ref 3.5–5.3)
Sodium: 138 mmol/L (ref 135–146)
Total Bilirubin: 0.3 mg/dL (ref 0.2–1.2)
Total Protein: 7.5 g/dL (ref 6.1–8.1)
eGFR: 87 mL/min/{1.73_m2} (ref >=60)

## 2023-04-13 NOTE — Progress Notes (Signed)
 Recommend for the patient to reach out to PCP to have potassium rechecked

## 2023-04-13 NOTE — Progress Notes (Signed)
 Potassium is borderline elevated-5.4.  please clarify if he is taking a potassium supplement or if he started on any new medications?  CBC WNL

## 2023-04-21 NOTE — Progress Notes (Unsigned)
 Office Visit Note  Patient: Jacob Massey             Date of Birth: Mar 04, 1980           MRN: [id number]             PCP: Merri Brunette, MD Referring: Merri Brunette, MD Visit Date: 05/05/2023 Occupation: @GUAROCC @  Subjective:  Right thumb pain   History of Present Illness: Jacob Massey is a 43 y.o. male with history of psoriatic arthritis and DDD.  Patient remains on  Cosentyx 150 mg sq injections once every 14 days.  He continues to tolerate Cosentyx without any side effects.  Patient denies any signs or symptoms of a psoriatic arthritis flare.  He denies any active psoriasis at this time.  Patient states that about 1 month ago he jammed his right thumb with a football.  He denies any swelling or bruising at the time of the injury.  He iced his thumb that day.  He has had some persistent discomfort in the right thumb with range of motion exercises especially while gripping.  Patient states that his right thumb was evaluated by his chiropractor who recommended imaging for further evaluation. Patient continues to have soreness and stiffness in both SI joints.  He denies any nocturnal pain.  He denies any active sciatica at this time.  He denies any joint swelling.  He denies any recent or recurrent infections.   Activities of Daily Living:  Patient reports morning stiffness for 15 minutes.   Patient Reports nocturnal pain.  Difficulty dressing/grooming: Reports Difficulty climbing stairs: Denies Difficulty getting out of chair: Denies Difficulty using hands for taps, buttons, cutlery, and/or writing: Reports  Review of Systems  Constitutional:  Positive for fatigue.  HENT:  Negative for mouth sores, mouth dryness and nose dryness.   Eyes:  Positive for dryness. Negative for pain.  Respiratory:  Negative for shortness of breath.   Cardiovascular:  Positive for chest pain. Negative for palpitations.  Gastrointestinal:  Positive for constipation. Negative for blood in stool and  diarrhea.  Endocrine: Positive for increased urination.  Genitourinary:  Negative for painful urination and involuntary urination.  Musculoskeletal:  Positive for gait problem, muscle weakness and morning stiffness. Negative for joint pain, joint pain, joint swelling, myalgias, muscle tenderness and myalgias.  Skin:  Negative for color change, rash and sensitivity to sunlight.  Allergic/Immunologic: Negative for susceptible to infections.  Neurological:  Negative for dizziness and headaches.  Hematological:  Negative for swollen glands.  Psychiatric/Behavioral:  Negative for depressed mood and sleep disturbance. The patient is not nervous/anxious.     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    Past Medical History:  Diagnosis Date   ADHD 12/03/2015   Bicipital tendinitis 12/03/2015   Genital herpes 12/03/2015   GERD (gastroesophageal reflux disease) 12/03/2015   History of diabetes mellitus, type II    per patient   Hypertension 12/03/2015   Psoriasis 12/03/2015   Psoriatic arthritis (HCC) 12/03/2015   Psoriatic  arthritis (HCC)    Sinusitis    Tendonitis     Family History  Problem Relation Age of Onset   Arthritis Mother    Diabetes Mother        borderline    Arrhythmia Mother    Heart attack Father    Cancer Father    ADD / ADHD Daughter    Heart attack Maternal Grandfather    Past Surgical History:  Procedure Laterality Date   KNEE  ARTHROSCOPY Left    VASECTOMY     Social History   Social History Narrative   Not on file    There is no immunization history on file for this patient.   Objective: Vital Signs: BP 139/87 (BP Location: Left Arm, Patient Position: Sitting, Cuff Size: Normal)   Pulse 80   Resp 17   Ht 6' 0.5" (1.842 m)   Wt 220 lb 3.2 oz (99.9 kg)   BMI 29.45 kg/m    Physical Exam Vitals and nursing note reviewed.  Constitutional:      Appearance: He is well-developed.  HENT:     Head: Normocephalic and atraumatic.  Eyes:     Conjunctiva/sclera: Conjunctivae normal.     Pupils: Pupils are equal, round, and reactive to light.  Cardiovascular:     Rate and Rhythm: Normal rate and regular rhythm.     Heart sounds: Normal heart sounds.  Pulmonary:     Effort: Pulmonary effort is normal.     Breath sounds: Normal breath sounds.  Abdominal:     General: Bowel sounds are normal.     Palpations: Abdomen is soft.  Musculoskeletal:     Cervical back: Normal range of motion and neck supple.  Skin:    General: Skin is warm and dry.     Capillary Refill: Capillary refill takes less than 2 seconds.  Neurological:     Mental Status: He is alert and oriented to person, place, and time.  Psychiatric:        Behavior: Behavior normal.      Musculoskeletal Exam: C-spine, thoracic spine, lumbar spine have good range of motion.  Discomfort with extension of the lumbar spine.  Tenderness over both SI joints.  Shoulder joints, elbow joints, wrist joints, MCPs, PIPs, DIPs have good range of motion with no synovitis.  Some tenderness at the right [facility] joint.  Hip joints have good range of motion with no groin pain.  Knee joints have good range of motion no warmth or effusion.  Ankle joints have good range of motion with no tenderness or joint swelling.  No evidence of Achilles tendinitis.  CDAI Exam: CDAI Score: -- Patient Global: --; Provider Global: -- Swollen: --; Tender: -- Joint Exam 05/05/2023    No joint exam has been documented for this visit   There is currently no information documented on the homunculus. Go to the Rheumatology activity and complete the homunculus joint exam.  Investigation: No additional findings.  Imaging: No results found.  Recent Labs: Lab Results  Component Value Date   WBC 7.3 04/12/2023   HGB 15.5 04/12/2023   PLT 376 04/12/2023   NA 138 04/12/2023   K 5.4 (H) 04/12/2023   CL 101 04/12/2023   CO2 30 04/12/2023   GLUCOSE 102 (H) 04/12/2023   BUN 9 04/12/2023   CREATININE 1.08 04/12/2023   BILITOT 0.3 04/12/2023   ALKPHOS 55 08/17/2016   AST 17 04/12/2023   ALT 34 04/12/2023   PROT 7.5 04/12/2023  ALBUMIN 4.4 08/17/2016   CALCIUM 10.0 04/12/2023   GFRAA 105 05/01/2020   QFTBGOLD NEGATIVE 10/19/2016   QFTBGOLDPLUS NEGATIVE 08/28/2022    Speciality Comments: Prior therapy: Humira and Enbrel (inadequate response) MTX- fatigue  Procedures:  No procedures performed Allergies: Adderall [amphetamine-dextroamphetamine] and Methotrexate derivatives    Assessment / Plan:     Visit Diagnoses: Psoriatic arthritis (HCC) - Ultrasound of the right hand performed on 03/29/2018 negative for synovitis: No synovitis or dactylitis noted on examination today.  He experiences discomfort in his lower back which seems to be activity related.  His lower back pain does not seem inflammatory at this time.  He is not experiencing any nocturnal pain in his lower back.  No evidence of Achilles tendinitis or plantar fasciitis.  No active psoriasis.  Patient remains on Cosentyx 150 mg subcutaneous injections every 14 days.  He continues to tolerate Cosentyx without any side effects.  No recent or recurrent infections.  He was advised to notify us if he develops any new or worsening symptoms.  He will follow-up in the office in 5 months or sooner if needed.  Psoriasis: No active psoriasis at this time.  High risk medication use - Cosentyx 150 mg sq injections once  every 14 days. CBC and CMP updated on 04/12/23.  His next lab work will be due in June and every 3 months.  TB gold negative on 08/28/22.  No recent or recurrent infections. Discussed the importance of holding cosentyx if he develops signs or symptoms of an infection and to resume once the infection has completely cleared.   DDD (degenerative disc disease), thoracic: No midline spinal tenderness in the thoracic region.  Spondylosis of lumbar region without myelopathy or radiculopathy: Patient continues to experience discomfort in his lower back.  His discomfort is exacerbated by sitting for prolonged periods of time as well as with extension.  He has not had any nocturnal pain.  He has occasional bouts of sciatica.  Overall his back pain does not sound inflammatory in origin.  He was advised to notify us if he develops any new or worsening symptoms.  He plans on continuing to see a chiropractor on a regular basis.  Pain of right thumb - Patient reports "jamming" his right thumb on a football 1 month ago and has had some persistent discomfort.  He iced the right thumb the day of the injury.  He did not notice any swelling or bruising at that time.  His chiropractor evaluated his right thumb and recommended x-rays for further evaluation.  On examination today he has some soreness over the right CMC joint and along the thenar eminence.  Good range of motion including full opposition.  No muscular atrophy noted.  X-rays of the right thumb were obtained today for further evaluation.  Plan: XR Finger Thumb Right  Serum potassium elevated - Potassium was 5.4 on 04/12/23.  Patient was advised to follow-up with PCP to have potassium rechecked and to consider an EKG but the patient did not follow-up with his PCP.  Plan to recheck potassium level today.  Plan: Potassium  Other medical conditions are listed as follows:  History of meniscal tear  History of hypertension: Blood pressure was 139/87 today.  History  of gastroesophageal reflux (GERD)  History of ADHD  History of depression    Orders: Orders Placed This Encounter  Procedures   XR Finger Thumb Right   Potassium   No orders of the defined types were placed in this encounter.  Follow-Up Instructions: Return in about 5 months (around 10/05/2023) for Psoriatic arthritis, DDD.   Gearldine Bienenstock, PA-C  Note - This record has been created using Dragon software.  Chart creation errors have been sought, but may not always  have been located. Such creation errors do not reflect on  the standard of medical care.

## 2023-05-05 ENCOUNTER — Ambulatory Visit: Payer: BC Managed Care – PPO | Attending: Physician Assistant | Admitting: Physician Assistant

## 2023-05-05 ENCOUNTER — Ambulatory Visit

## 2023-05-05 ENCOUNTER — Encounter: Payer: Self-pay | Admitting: Physician Assistant

## 2023-05-05 VITALS — BP 139/87 | HR 80 | Resp 17 | Ht 72.5 in | Wt 220.2 lb

## 2023-05-05 DIAGNOSIS — Z79899 Other long term (current) drug therapy: Secondary | ICD-10-CM | POA: Diagnosis not present

## 2023-05-05 DIAGNOSIS — L409 Psoriasis, unspecified: Secondary | ICD-10-CM

## 2023-05-05 DIAGNOSIS — M47816 Spondylosis without myelopathy or radiculopathy, lumbar region: Secondary | ICD-10-CM

## 2023-05-05 DIAGNOSIS — E875 Hyperkalemia: Secondary | ICD-10-CM

## 2023-05-05 DIAGNOSIS — L4050 Arthropathic psoriasis, unspecified: Secondary | ICD-10-CM

## 2023-05-05 DIAGNOSIS — Z8659 Personal history of other mental and behavioral disorders: Secondary | ICD-10-CM

## 2023-05-05 DIAGNOSIS — M5134 Other intervertebral disc degeneration, thoracic region: Secondary | ICD-10-CM | POA: Diagnosis not present

## 2023-05-05 DIAGNOSIS — Z8679 Personal history of other diseases of the circulatory system: Secondary | ICD-10-CM

## 2023-05-05 DIAGNOSIS — M79644 Pain in right finger(s): Secondary | ICD-10-CM

## 2023-05-05 DIAGNOSIS — Z87828 Personal history of other (healed) physical injury and trauma: Secondary | ICD-10-CM

## 2023-05-05 DIAGNOSIS — Z8719 Personal history of other diseases of the digestive system: Secondary | ICD-10-CM

## 2023-05-05 NOTE — Patient Instructions (Signed)

## 2023-05-05 NOTE — Progress Notes (Signed)
 X-rays of the right thumb did not reveal any acute abnormality.  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

## 2023-05-06 LAB — POTASSIUM: Potassium: 4.8 mmol/L (ref 3.5–5.3)

## 2023-05-06 NOTE — Progress Notes (Signed)
Potassium WNL

## 2023-05-07 ENCOUNTER — Other Ambulatory Visit: Payer: Self-pay | Admitting: Physician Assistant

## 2023-05-07 ENCOUNTER — Other Ambulatory Visit: Payer: Self-pay

## 2023-05-07 ENCOUNTER — Other Ambulatory Visit: Payer: Self-pay | Admitting: Pharmacy Technician

## 2023-05-07 MED ORDER — COSENTYX SENSOREADY (300 MG) 150 MG/ML ~~LOC~~ SOAJ
SUBCUTANEOUS | 0 refills | Status: DC
  Filled 2023-05-07: qty 2, 28d supply, fill #0
  Filled 2023-06-11: qty 2, 28d supply, fill #1
  Filled 2023-07-06: qty 2, 28d supply, fill #2

## 2023-05-07 NOTE — Progress Notes (Signed)
 Specialty Pharmacy Refill Coordination Note  Jacob Massey is a 43 y.o. male contacted today regarding refills of specialty medication(s) Secukinumab (Cosentyx Sensoready (300 MG))   Patient requested Delivery   Delivery date: 05/11/23 (Delivery date is pending on refill renewal from MD.)   Verified address: [address]  [name] [id number]   Medication will be filled on 05/10/23.   This fill date is pending response to refill request from provider. Patient is aware and if they have not received fill by intended date they must follow up with pharmacy.

## 2023-05-07 NOTE — Telephone Encounter (Signed)
 Last Fill: 04/09/2023 2 ml  Labs: 04/12/2023 Potassium is borderline elevated-5.4.  please clarify if he is taking a potassium supplement or if he started on any new medications? CBC WNL  TB Gold: 08/28/2022  TB gold negative   Next Visit: 09/23/2023  Last Visit: 05/05/2023  DX: Psoriatic arthritis   Current Dose per office note 05/05/2023: Cosentyx 150 mg sq injections once every 14 days.   Okay to refill Cosentyx?

## 2023-05-10 ENCOUNTER — Other Ambulatory Visit: Payer: Self-pay

## 2023-06-09 ENCOUNTER — Other Ambulatory Visit: Payer: Self-pay

## 2023-06-11 ENCOUNTER — Other Ambulatory Visit: Payer: Self-pay

## 2023-06-11 NOTE — Progress Notes (Signed)
 Specialty Pharmacy Refill Coordination Note  Jacob Massey is a 43 y.o. male contacted today regarding refills of specialty medication(s) Secukinumab  (Cosentyx  Sensoready (300 MG))   Patient requested Delivery   Delivery date: 06/16/23   Verified address: [address]   [place] [address]   Medication will be filled on 06/15/23.

## 2023-06-11 NOTE — Progress Notes (Signed)
 Patient Satisfaction Survey Complete.

## 2023-06-15 ENCOUNTER — Other Ambulatory Visit: Payer: Self-pay

## 2023-06-21 ENCOUNTER — Telehealth: Payer: Self-pay

## 2023-06-21 NOTE — Telephone Encounter (Signed)
 Received notification from [facility] regarding a prior authorization for COSENTYX  SQ. Authorization has been APPROVED from 06/21/23 to 06/20/24. Approval letter sent to scan center.  Authorization # [id number]  Geraldene Kleine, PharmD, MPH, BCPS, CPP Clinical Pharmacist (Rheumatology and Pulmonology)

## 2023-06-21 NOTE — Telephone Encounter (Signed)
 Submitted a Prior Authorization RENEWAL request to [facility] for COSENTYX  SQ via FAX. Will update once we receive a response.  Case # [id number] Fax: [phone] Phone: [phone]  Geraldene Kleine, PharmD, MPH, BCPS, CPP Clinical Pharmacist (Rheumatology and Pulmonology)

## 2023-06-23 ENCOUNTER — Other Ambulatory Visit: Payer: Self-pay

## 2023-07-06 ENCOUNTER — Other Ambulatory Visit: Payer: Self-pay

## 2023-07-06 NOTE — Progress Notes (Signed)
 Specialty Pharmacy Ongoing Clinical Assessment Note  Jacob Massey is a 43 y.o. male who is being followed by the specialty pharmacy service for RxSp Psoriatic Arthritis   Patient's specialty medication(s) reviewed today: Secukinumab  (Cosentyx  Sensoready (300 MG))   Missed doses in the last 4 weeks: 0   Patient/Caregiver did not have any additional questions or concerns.   Therapeutic benefit summary: Patient is achieving benefit   Adverse events/side effects summary: No adverse events/side effects   Patient's therapy is appropriate to: Continue    Goals Addressed             This Visit's Progress    Minimize recurrence of flares   On track    Patient is on track. Patient will maintain adherence.  Patient reports that he remains reasonably well-controlled at this time.         Follow up: 6 months  Rosealyn Little M Kamilya [place] Specialty Pharmacist

## 2023-07-06 NOTE — Progress Notes (Signed)
 Specialty Pharmacy Refill Coordination Note  Jacob Massey is a 43 y.o. male contacted today regarding refills of specialty medication(s) Secukinumab  (Cosentyx  Sensoready (300 MG))   Patient requested Delivery   Delivery date: 07/13/23   Verified address: [address]   [place] [address]   Medication will be filled on 07/12/23.

## 2023-07-12 ENCOUNTER — Other Ambulatory Visit: Payer: Self-pay

## 2023-08-03 ENCOUNTER — Other Ambulatory Visit: Payer: Self-pay | Admitting: Physician Assistant

## 2023-08-03 ENCOUNTER — Other Ambulatory Visit: Payer: Self-pay

## 2023-08-03 DIAGNOSIS — Z9225 Personal history of immunosupression therapy: Secondary | ICD-10-CM

## 2023-08-03 DIAGNOSIS — Z111 Encounter for screening for respiratory tuberculosis: Secondary | ICD-10-CM

## 2023-08-03 DIAGNOSIS — Z79899 Other long term (current) drug therapy: Secondary | ICD-10-CM

## 2023-08-03 MED ORDER — COSENTYX SENSOREADY (300 MG) 150 MG/ML ~~LOC~~ SOAJ
SUBCUTANEOUS | 0 refills | Status: DC
  Filled 2023-08-03: qty 2, fill #0
  Filled 2023-08-05: qty 2, 28d supply, fill #0

## 2023-08-03 NOTE — Telephone Encounter (Signed)
 Last Fill: 05/07/2023  Labs: 04/12/2023 Potassium is borderline elevated-5.4. CBC WNL   TB Gold: 08/28/2022 Neg    Next Visit: 09/23/2023  Last Visit: 05/05/2023  IK:Ednmpjupr arthritis   Current Dose per office note 05/05/2023: Cosentyx  150 mg sq injections once every 14 days   Patient advised he is due to update labs.   Okay to refill Cosentyx ?

## 2023-08-04 ENCOUNTER — Other Ambulatory Visit: Payer: Self-pay | Admitting: *Deleted

## 2023-08-04 DIAGNOSIS — Z111 Encounter for screening for respiratory tuberculosis: Secondary | ICD-10-CM

## 2023-08-04 DIAGNOSIS — Z79899 Other long term (current) drug therapy: Secondary | ICD-10-CM

## 2023-08-04 DIAGNOSIS — Z9225 Personal history of immunosupression therapy: Secondary | ICD-10-CM

## 2023-08-05 ENCOUNTER — Other Ambulatory Visit (HOSPITAL_COMMUNITY): Payer: Self-pay

## 2023-08-05 ENCOUNTER — Ambulatory Visit: Payer: Self-pay | Admitting: Rheumatology

## 2023-08-05 ENCOUNTER — Other Ambulatory Visit: Payer: Self-pay

## 2023-08-05 NOTE — Progress Notes (Signed)
 Specialty Pharmacy Refill Coordination Note  Jacob Massey is a 43 y.o. male contacted today regarding refills of specialty medication(s) Secukinumab  (Cosentyx  Sensoready (300 MG))   Patient requested Delivery   Delivery date: 08/10/23   Verified address: [address]   [place] [address]   Medication will be filled on 07.07.25.

## 2023-08-05 NOTE — Progress Notes (Signed)
 CBC and CMP normal

## 2023-08-08 LAB — COMPREHENSIVE METABOLIC PANEL WITH GFR
AG Ratio: 1.8 (calc) (ref 1.0–2.5)
ALT: 43 U/L (ref 9–46)
AST: 21 U/L (ref 10–40)
Albumin: 4.8 g/dL (ref 3.6–5.1)
Alkaline phosphatase (APISO): 50 U/L (ref 36–130)
BUN: 13 mg/dL (ref 7–25)
CO2: 29 mmol/L (ref 20–32)
Calcium: 9.7 mg/dL (ref 8.6–10.3)
Chloride: 101 mmol/L (ref 98–110)
Creat: 1.11 mg/dL (ref 0.60–1.29)
Globulin: 2.6 g/dL (ref 1.9–3.7)
Glucose, Bld: 95 mg/dL (ref 65–99)
Potassium: 4.5 mmol/L (ref 3.5–5.3)
Sodium: 138 mmol/L (ref 135–146)
Total Bilirubin: 0.4 mg/dL (ref 0.2–1.2)
Total Protein: 7.4 g/dL (ref 6.1–8.1)
eGFR: 83 mL/min/1.73m2 (ref >=60)

## 2023-08-08 LAB — CBC WITH DIFFERENTIAL/PLATELET
Absolute Lymphocytes: 2409 {cells}/uL (ref 850–3900)
Absolute Monocytes: 521 {cells}/uL (ref 200–950)
Basophils Absolute: 59 {cells}/uL (ref 0–200)
Basophils Relative: 0.9 %
Eosinophils Absolute: 231 {cells}/uL (ref 15–500)
Eosinophils Relative: 3.5 %
HCT: 42.4 % (ref 38.5–50.0)
Hemoglobin: 14.3 g/dL (ref 13.2–17.1)
MCH: 30.4 pg (ref 27.0–33.0)
MCHC: 33.7 g/dL (ref 32.0–36.0)
MCV: 90.2 fL (ref 80.0–100.0)
MPV: 9.3 fL (ref 7.5–12.5)
Monocytes Relative: 7.9 %
Neutro Abs: 3379 {cells}/uL (ref 1500–7800)
Neutrophils Relative %: 51.2 %
Platelets: 340 Thousand/uL (ref 140–400)
RBC: 4.7 Million/uL (ref 4.20–5.80)
RDW: 13 % (ref 11.0–15.0)
Total Lymphocyte: 36.5 %
WBC: 6.6 Thousand/uL (ref 3.8–10.8)

## 2023-08-08 LAB — QUANTIFERON-TB GOLD PLUS
Mitogen-NIL: 8.87 [IU]/mL
NIL: 0.01 [IU]/mL
QuantiFERON-TB Gold Plus: NEGATIVE
TB1-NIL: 0 [IU]/mL
TB2-NIL: 0.01 [IU]/mL

## 2023-08-08 NOTE — Progress Notes (Signed)
 CBC, CMP are normal and TB Gold is negative.

## 2023-08-09 ENCOUNTER — Other Ambulatory Visit: Payer: Self-pay

## 2023-09-02 ENCOUNTER — Telehealth: Payer: Self-pay | Admitting: *Deleted

## 2023-09-02 NOTE — Telephone Encounter (Signed)
 LFTs and renal function WNL on 08/04/23.  Ok to take diclofenac PRN temporarily.

## 2023-09-02 NOTE — Telephone Encounter (Signed)
 I called patient,  LFTs and renal function WNL on 08/04/23.  Ok to take diclofenac PRN temporarily.

## 2023-09-02 NOTE — Telephone Encounter (Signed)
 Patient has been prescribed diclofenac by orthopedic doctor. Patient wants to know if it's ok for him to take meds while he's waiting to have his MRI? If not, patient wants to know what he should take based on his past history of liver function? Please advise.

## 2023-09-03 ENCOUNTER — Other Ambulatory Visit: Payer: Self-pay

## 2023-09-03 ENCOUNTER — Encounter (INDEPENDENT_AMBULATORY_CARE_PROVIDER_SITE_OTHER): Payer: Self-pay

## 2023-09-03 ENCOUNTER — Other Ambulatory Visit: Payer: Self-pay | Admitting: Rheumatology

## 2023-09-03 NOTE — Progress Notes (Signed)
 Specialty Pharmacy Refill Coordination Note  Jacob Massey is a 43 y.o. male contacted today regarding refills of specialty medication(s) Secukinumab  (Cosentyx  Sensoready (300 MG))   Patient requested (Patient-Rptd) Delivery   Delivery date: 09/08/23   Verified address: (Patient-Rptd) [address] [name] [address]   Medication will be filled on 09/07/23, pending refill approval.

## 2023-09-03 NOTE — Telephone Encounter (Signed)
 Last Fill: 08/03/2023  Labs: 08/04/2023 CBC and CMP normal.   TB Gold: 08/04/2023 TB Gold Negative    Next Visit: 09/23/2023  Last Visit: 05/05/2023  DX: Psoriatic arthritis   Current Dose per office note 05/05/2023: Cosentyx  150 mg sq injections once every 14 days   Okay to refill Cosentyx ?

## 2023-09-06 ENCOUNTER — Other Ambulatory Visit: Payer: Self-pay

## 2023-09-06 ENCOUNTER — Other Ambulatory Visit (HOSPITAL_COMMUNITY): Payer: Self-pay

## 2023-09-06 MED ORDER — COSENTYX SENSOREADY (300 MG) 150 MG/ML ~~LOC~~ SOAJ
SUBCUTANEOUS | 0 refills | Status: DC
  Filled 2023-09-06: qty 2, 28d supply, fill #0
  Filled 2023-09-30: qty 2, 28d supply, fill #1
  Filled 2023-10-29: qty 2, 28d supply, fill #2

## 2023-09-07 ENCOUNTER — Other Ambulatory Visit: Payer: Self-pay

## 2023-09-09 NOTE — Progress Notes (Unsigned)
 Office Visit Note  Patient: Jacob Massey             Date of Birth: 1980-10-06           MRN: [id number]             PCP: Clarice Nottingham, MD Referring: Clarice Nottingham, MD Visit Date: 09/23/2023 Occupation: @GUAROCC @  Subjective:  Right hip pain   History of Present Illness: Jacob Massey is a 43 y.o. male with history of psoriatic arthritis.  Patient remains on Cosentyx  150 mg sq injections once every 14 days.  He continues to tolerate Cosentyx  without any side effects or injection site reactions.  He has been experiencing increased discomfort in the right hip which he attributes to a golf related injury.  He scheduled for an MRI today for further evaluation.  He will remain under the care of [facility].  He experiences some stiffness in his hips when rising from a seated position.  He denies any SI joint pain at this time.  He denies any Achilles tendinitis or plantar fasciitis.  He denies any joint swelling currently.  Patient states that he has noticed a scaly rash around his nose, beard, and a patch on his chest.  He is unsure if the symptoms are related to psoriasis or something else.  He has a prescription for hydrocortisone cream which he applies as needed.   He denies any recent or recurrent infections.     Activities of Daily Living:  Patient reports morning stiffness for a few minutes.   Patient Reports nocturnal pain.  Difficulty dressing/grooming: Reports Difficulty climbing stairs: Reports Difficulty getting out of chair: Reports Difficulty using hands for taps, buttons, cutlery, and/or writing: Denies  Review of Systems  Constitutional:  Positive for fatigue.  HENT:  Positive for mouth dryness. Negative for mouth sores.   Eyes:  Negative for dryness.  Respiratory:  Positive for shortness of breath.        On exertion   Cardiovascular:  Negative for chest pain and palpitations.  Gastrointestinal:  Negative for blood in stool, constipation and diarrhea.   Endocrine: Negative for increased urination.  Genitourinary:  Negative for involuntary urination.  Musculoskeletal:  Positive for joint pain, gait problem, joint pain, muscle weakness and morning stiffness. Negative for joint swelling, myalgias, muscle tenderness and myalgias.  Skin:  Positive for rash and sensitivity to sunlight. Negative for color change.  Allergic/Immunologic: Negative for susceptible to infections.  Neurological:  Negative for dizziness and headaches.  Hematological:  Negative for swollen glands.  Psychiatric/Behavioral:  Positive for sleep disturbance. Negative for depressed mood. The patient is not nervous/anxious.     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    Past Medical History:  Diagnosis Date   ADHD 12/03/2015   Bicipital tendinitis 12/03/2015   Genital herpes 12/03/2015   GERD (gastroesophageal reflux disease) 12/03/2015   History of diabetes mellitus, type II    per patient   Hypertension 12/03/2015   Psoriasis 12/03/2015  Psoriatic arthritis (HCC) 12/03/2015   Psoriatic arthritis (HCC)    Sinusitis    Tendonitis     Family History  Problem Relation Age of Onset   Arthritis Mother    Diabetes Mother        borderline    Arrhythmia Mother    Heart attack Father    Cancer Father    Heart attack Maternal Grandfather    ADD / ADHD Daughter    Past Surgical History:  Procedure Laterality Date   KNEE ARTHROSCOPY  Left    VASECTOMY     Social History   Social History Narrative   Not on file    There is no immunization history on file for this patient.   Objective: Vital Signs: BP (!) 142/92 (BP Location: Left Arm, Patient Position: Sitting, Cuff Size: Normal)   Pulse 79   Resp 17   Ht 6' 0.5 (1.842 m)   Wt 223 lb 9.6 oz (101.4 kg)   BMI 29.91 kg/m    Physical Exam Vitals and nursing note reviewed.  Constitutional:      Appearance: He is well-developed.  HENT:     Head: Normocephalic and atraumatic.  Eyes:     Conjunctiva/sclera: Conjunctivae normal.     Pupils: Pupils are equal, round, and reactive to light.  Cardiovascular:     Rate and Rhythm: Normal rate and regular rhythm.     Heart sounds: Normal heart sounds.  Pulmonary:     Effort: Pulmonary effort is normal.     Breath sounds: Normal breath sounds.  Abdominal:     General: Bowel sounds are normal.     Palpations: Abdomen is soft.  Musculoskeletal:     Cervical back: Normal range of motion and neck supple.  Skin:    General: Skin is warm and dry.     Capillary Refill: Capillary refill takes less than 2 seconds.  Neurological:     Mental Status: He is alert and oriented to person, place, and time.  Psychiatric:        Behavior: Behavior normal.      Musculoskeletal Exam: C-spine, thoracic spine, and lumbar spine good ROM.  Shoulder joints, wrist joints, MCPs, PIPs, and DIPs good ROM with no synovitis.  Tenderness of the right 3rd PIP joint.  Painful ROM of the right hip.  Knee joints have good ROM with no warmth or effusion.  Ankle joints have good ROM with no tenderness or joint swelling.  No evidence of achilles tendonitis or plantar fasciitis.   CDAI Exam: CDAI Score: -- Patient Global: --; Provider Global: -- Swollen: --; Tender: -- Joint Exam 09/23/2023   No joint exam has been documented for this visit   There is currently no information documented on the homunculus. Go to the Rheumatology activity and  complete the homunculus joint exam.  Investigation: No additional findings.  Imaging: No results found.  Recent Labs: Lab Results  Component Value Date   WBC 6.6 08/04/2023   HGB 14.3 08/04/2023   PLT 340 08/04/2023   NA 138 08/04/2023   K 4.5 08/04/2023   CL 101 08/04/2023   CO2 29 08/04/2023   GLUCOSE 95 08/04/2023   BUN 13 08/04/2023   CREATININE 1.11 08/04/2023   BILITOT 0.4 08/04/2023   ALKPHOS 55 08/17/2016   AST 21 08/04/2023   ALT 43 08/04/2023   PROT 7.4 08/04/2023   ALBUMIN 4.4 08/17/2016   CALCIUM 9.7 08/04/2023   GFRAA 105 05/01/2020   QFTBGOLD NEGATIVE 10/19/2016  QFTBGOLDPLUS NEGATIVE 08/04/2023    Speciality Comments: Prior therapy: Humira and Enbrel  (inadequate response) MTX- fatigue  Procedures:  No procedures performed Allergies: Adderall [amphetamine-dextroamphetamine] and Methotrexate derivatives   Assessment / Plan:     Visit Diagnoses: Psoriatic arthritis (HCC) - Ultrasound of the right hand performed on 03/29/2018 negative for synovitis: No synovitis or dactylitis noted on examination today.  He has morning stiffness has been lasting a few minutes daily.  He has not had any difficulty performing ADLs.  He has some soreness and stiffness of the right third PIP joint but no synovitis was noted.  No evidence of Achilles tendinitis or plantar fasciitis.  No SI joint tenderness upon palpation. Overall he is clinically been doing well on Cosentyx  150 mg sq injections once every 14 days.  He is tolerating Cosentyx  without any side effects and has not had any recent gaps in therapy.  No recent or recurrent infections. Patient would like to have lab work with appointments that he will start following up every 3 months.  Psoriasis: Patient experiences a scaly rash around his nose and beard--Difficult to differentiate psoriasis versus seborrheic dermatitis.  Offered a referral to dermatology.  He has a prescription for hydrocortisone 2.5% cream which she can  apply topically as needed.  He has no psoriasis on his scalp at this time.  No fingernail pitting noted.  He recently had itching and dryness on the anterior aspect left knee and a patch on his chest.  He was given a prescription for clobetasol  cream which he can apply topically as needed.  High risk medication use - Cosentyx  150 mg sq injections once every 14 days. CBC and CMP drawn on 08/04/23. His next lab work will be due in October and every 3 months.   TB gold negative 08/04/23.  No recent or recurrent infections. Discussed importance of holding Cosentyx  if he develops signs or symptoms of infection and to resume once infection has completely cleared.  DDD (degenerative disc disease), thoracic: No midline spinal tenderness.    Spondylosis of lumbar region without myelopathy or radiculopathy: He experiences some stiffness when rising from a seated position.  No midline spinal tenderness.  No SI joint tenderness.  Pain in right hip: Patient is been experiencing discomfort in the right hip which he attributes to a golf related injury.  He has been under the care of [facility] and is scheduled for a MRI today for further evaluation.  Other medical conditions are listed as follows:  History of meniscal tear  History of hypertension: Blood pressure was 142/92 today in the office.  Blood pressures recheck prior to leaving.  Patient was advised to monitor blood pressure closely and return PCP if blood pressure remains elevated.  History of gastroesophageal reflux (GERD)  History of ADHD  History of depression  Orders: No orders of the defined types were placed in this encounter.  Meds ordered this encounter  Medications   clobetasol  cream (TEMOVATE ) 0.05 %    Sig: Apply 1 Application topically 2 (two) times daily.    Dispense:  30 g    Refill:  0     Follow-Up Instructions: Return in about 2 months (around 11/23/2023) for Psoriatic arthritis.   Waddell CHRISTELLA Craze, PA-C  Note -  This record has been created using Dragon software.  Chart creation errors have been sought, but may not always  have been located. Such creation errors do not reflect on  the standard of medical care.

## 2023-09-23 ENCOUNTER — Other Ambulatory Visit: Payer: Self-pay

## 2023-09-23 ENCOUNTER — Encounter: Payer: Self-pay | Admitting: Physician Assistant

## 2023-09-23 ENCOUNTER — Ambulatory Visit: Attending: Physician Assistant | Admitting: Physician Assistant

## 2023-09-23 VITALS — BP 142/92 | HR 79 | Resp 17 | Ht 72.5 in | Wt 223.6 lb

## 2023-09-23 DIAGNOSIS — L4050 Arthropathic psoriasis, unspecified: Secondary | ICD-10-CM

## 2023-09-23 DIAGNOSIS — Z79899 Other long term (current) drug therapy: Secondary | ICD-10-CM

## 2023-09-23 DIAGNOSIS — M5134 Other intervertebral disc degeneration, thoracic region: Secondary | ICD-10-CM | POA: Diagnosis not present

## 2023-09-23 DIAGNOSIS — M47816 Spondylosis without myelopathy or radiculopathy, lumbar region: Secondary | ICD-10-CM

## 2023-09-23 DIAGNOSIS — Z8679 Personal history of other diseases of the circulatory system: Secondary | ICD-10-CM

## 2023-09-23 DIAGNOSIS — Z87828 Personal history of other (healed) physical injury and trauma: Secondary | ICD-10-CM

## 2023-09-23 DIAGNOSIS — M25551 Pain in right hip: Secondary | ICD-10-CM

## 2023-09-23 DIAGNOSIS — Z8659 Personal history of other mental and behavioral disorders: Secondary | ICD-10-CM

## 2023-09-23 DIAGNOSIS — L409 Psoriasis, unspecified: Secondary | ICD-10-CM

## 2023-09-23 DIAGNOSIS — Z8719 Personal history of other diseases of the digestive system: Secondary | ICD-10-CM

## 2023-09-23 MED ORDER — CLOBETASOL PROPIONATE 0.05 % EX CREA
1.0000 | TOPICAL_CREAM | Freq: Two times a day (BID) | CUTANEOUS | 0 refills | Status: AC
  Filled 2023-09-23: qty 30, 30d supply, fill #0

## 2023-09-23 NOTE — Patient Instructions (Signed)
 Standing Labs We placed an order today for your standing lab work.   Please have your standing labs drawn in October and every 3 months  Please have your labs drawn 2 weeks prior to your appointment so that the provider can discuss your lab results at your appointment, if possible.  Please note that you Paulino see your imaging and lab results in MyChart before we have reviewed them. We will contact you once all results are reviewed. Please allow our office up to 72 hours to thoroughly review all of the results before contacting the office for clarification of your results.  WALK-IN LAB HOURS  Monday through Thursday from 8:00 am -12:30 pm and 1:00 pm-4:30 pm and Friday from 8:00 am-12:00 pm.  Patients with office visits requiring labs will be seen before walk-in labs.  You Sando encounter longer than normal wait times. Please allow additional time. Wait times Hillery be shorter on  Monday and Thursday afternoons.  We do not book appointments for walk-in labs. We appreciate your patience and understanding with our staff.   Labs are drawn by [facility]. Please bring your co-pay at the time of your lab draw.  You [place] receive a bill from [facility] for your lab work.  Please note if you are on Hydroxychloroquine and and an order has been placed for a Hydroxychloroquine level,  you will need to have it drawn 4 hours or more after your last dose.  If you wish to have your labs drawn at another location, please call the office 24 hours in advance so we can fax the orders.  The office is located at [address], [address], [place], [place] [address]   If you have any questions regarding directions or hours of operation,  please call [phone].   As a reminder, please drink plenty of water prior to coming for your lab work. Thanks!

## 2023-09-28 ENCOUNTER — Other Ambulatory Visit: Payer: Self-pay

## 2023-09-30 ENCOUNTER — Other Ambulatory Visit: Payer: Self-pay

## 2023-10-01 ENCOUNTER — Other Ambulatory Visit: Payer: Self-pay

## 2023-10-01 NOTE — Progress Notes (Signed)
 Specialty Pharmacy Refill Coordination Note  Jacob Massey is a 43 y.o. male contacted today regarding refills of specialty medication(s) Secukinumab  (Cosentyx  Sensoready (300 MG))   Patient requested Delivery   Delivery date: 10/06/23   Verified address: [address]   [place] [address]   Medication will be filled on 10/05/23.

## 2023-10-05 ENCOUNTER — Other Ambulatory Visit: Payer: Self-pay

## 2023-10-25 ENCOUNTER — Other Ambulatory Visit: Payer: Self-pay

## 2023-10-26 ENCOUNTER — Other Ambulatory Visit: Payer: Self-pay

## 2023-10-27 ENCOUNTER — Other Ambulatory Visit: Payer: Self-pay

## 2023-10-29 ENCOUNTER — Other Ambulatory Visit: Payer: Self-pay

## 2023-10-29 ENCOUNTER — Other Ambulatory Visit (HOSPITAL_COMMUNITY): Payer: Self-pay

## 2023-10-29 NOTE — Progress Notes (Signed)
 Specialty Pharmacy Refill Coordination Note  Spoke with Khanh Tanori is a 43 y.o. male contacted today regarding refills of specialty medication(s) Secukinumab  (Cosentyx  Sensoready (300 MG))  Doses on hand: 0  Injection date: 11/10/23   Patient requested: Delivery   Delivery date: 11/05/23   Verified address: [address] [place] [address]  Medication will be filled on 11/04/23.

## 2023-11-04 ENCOUNTER — Other Ambulatory Visit: Payer: Self-pay

## 2023-11-15 ENCOUNTER — Other Ambulatory Visit (HOSPITAL_BASED_OUTPATIENT_CLINIC_OR_DEPARTMENT_OTHER): Payer: Self-pay | Admitting: Internal Medicine

## 2023-11-15 DIAGNOSIS — E785 Hyperlipidemia, unspecified: Secondary | ICD-10-CM

## 2023-11-17 ENCOUNTER — Telehealth: Payer: Self-pay

## 2023-11-17 NOTE — Telephone Encounter (Signed)
 Labs received from:[facility]  Drawn on:10/21/2023  Reviewed by:Waddell Craze, PA-C  Labs drawn:Vitamin D, Urinalysis, CBC, CMP, Lipid Panel, and Prostate  Results:ALT 47 Cholesterol 214 LDL Chol Calc 145

## 2023-11-22 ENCOUNTER — Ambulatory Visit (HOSPITAL_COMMUNITY)
Admission: RE | Admit: 2023-11-22 | Discharge: 2023-11-22 | Disposition: A | Discharge status: Home / Self Care | Payer: Self-pay | Source: Ambulatory Visit | Attending: Internal Medicine | Admitting: Internal Medicine

## 2023-11-22 DIAGNOSIS — E785 Hyperlipidemia, unspecified: Secondary | ICD-10-CM | POA: Insufficient documentation

## 2023-11-24 ENCOUNTER — Ambulatory Visit: Admitting: Physician Assistant

## 2023-11-25 ENCOUNTER — Other Ambulatory Visit: Payer: Self-pay | Admitting: Physician Assistant

## 2023-11-25 ENCOUNTER — Other Ambulatory Visit: Payer: Self-pay

## 2023-11-25 ENCOUNTER — Other Ambulatory Visit (HOSPITAL_COMMUNITY): Payer: Self-pay

## 2023-11-25 MED ORDER — COSENTYX SENSOREADY (300 MG) 150 MG/ML ~~LOC~~ SOAJ
SUBCUTANEOUS | 0 refills | Status: DC
  Filled 2023-11-25: qty 6, fill #0

## 2023-11-25 NOTE — Progress Notes (Signed)
 Office Visit Note  Patient: Jacob Massey             Date of Birth: [birth year]           MRN: [id number]             PCP: Clarice Nottingham, MD Referring: Clarice Nottingham, MD Visit Date: 12/09/2023 Occupation: Data Unavailable  Subjective:  Right hip pain  History of Present Illness: Maxemiliano Riel is a 43 y.o. male with history of psoriatic arthritis and DDD. Patient remains on  Cosentyx  150 mg sq injections once every 14 days.  He is tolerating Cosentyx  without any side effects.  He denies any recent gaps in therapy.  Patient reports that he is scheduled for a right total hip replacement on 01/14/2024 with Dr. Fidel.  Patient states that he has been diagnosed with avascular necrosis of both hips, right more severe than left.  He has had significant pain in the right hip which has been radiating down his right leg at times.  He has had increased discomfort and difficulty with some responsibilities at work due to the severity of discomfort.  He denies any increased SI joint pain at this time.  He denies any Achilles tendinitis.  He has occasional heel pain and is planning to get fitted for orthotics in the future.  He denies any joint swelling at this time.  He denies any active psoriasis.    Activities of Daily Living:  Patient reports morning stiffness for 5-10 minutes.   Patient Denies nocturnal pain.  Difficulty dressing/grooming: Denies Difficulty climbing stairs: Reports Difficulty getting out of chair: Reports Difficulty using hands for taps, buttons, cutlery, and/or writing: Reports  Review of Systems  Constitutional:  Positive for fatigue.  HENT:  Positive for mouth dryness. Negative for mouth sores.   Eyes:  Negative for dryness.  Respiratory:  Negative for shortness of breath.   Cardiovascular:  Negative for chest pain and palpitations.  Gastrointestinal:  Positive for constipation and diarrhea. Negative for blood in stool.  Endocrine: Positive for increased urination.   Genitourinary:  Negative for involuntary urination.  Musculoskeletal:  Positive for joint pain, joint pain, myalgias, morning stiffness and myalgias. Negative for gait problem, joint swelling, muscle weakness and muscle tenderness.  Skin:  Negative for color change, rash, hair loss and sensitivity to sunlight.  Allergic/Immunologic: Negative for susceptible to infections.  Neurological:  Negative for dizziness and headaches.  Hematological:  Negative for swollen glands.  Psychiatric/Behavioral:  Positive for sleep disturbance. Negative for depressed mood. The patient is not nervous/anxious.     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    Past Medical History:  Diagnosis Date   ADHD 12/03/2015   AVN (avascular necrosis of bone) (HCC)    Bicipital tendinitis 12/03/2015   Genital herpes 12/03/2015   GERD (gastroesophageal reflux disease) 12/03/2015   History of diabetes mellitus, type II    per patient   Hypertension 12/03/2015   Psoriasis 12/03/2015   Psoriatic arthritis (HCC) 12/03/2015  Psoriatic arthritis (HCC)    Sinusitis    Tendonitis     Family History  Problem Relation Age of Onset   Arthritis Mother    Diabetes Mother        borderline    Arrhythmia Mother    Heart attack Father    Cancer Father    Heart attack Maternal Grandfather    ADD / ADHD Daughter    Past Surgical History:  Procedure Laterality Date   KNEE ARTHROSCOPY Left     VASECTOMY     Social History   Tobacco Use   Smoking status: Former    Current packs/day: 0.00    Average packs/day: 1 pack/day for 5.0 years (5.0 ttl pk-yrs)    Types: Cigarettes    Start date: 47    Quit date: 2004    Years since quitting: 21.8    Passive exposure: Never   Smokeless tobacco: Never  Vaping Use   Vaping status: Never Used  Substance Use Topics   Alcohol use: Yes    Comment: occ   Drug use: No   Social History   Social History Narrative   Not on file      There is no immunization history on file for this patient.   Objective: Vital Signs: BP (!) 127/91   Pulse 86   Temp (!) 97 F (36.1 C)   Resp 14   Ht 6' 0.5 (1.842 m)   Wt 223 lb 6.4 oz (101.3 kg)   BMI 29.88 kg/m    Physical Exam Vitals and nursing note reviewed.  Constitutional:      Appearance: He is well-developed.  HENT:     Head: Normocephalic and atraumatic.  Eyes:     Conjunctiva/sclera: Conjunctivae normal.     Pupils: Pupils are equal, round, and reactive to light.  Cardiovascular:     Rate and Rhythm: Normal rate and regular rhythm.     Heart sounds: Normal heart sounds.  Pulmonary:     Effort: Pulmonary effort is normal.     Breath sounds: Normal breath sounds.  Abdominal:     General: Bowel sounds are normal.     Palpations: Abdomen is soft.  Musculoskeletal:     Cervical back: Normal range of motion and neck supple.  Skin:    General: Skin is warm and dry.     Capillary Refill: Capillary refill takes less than 2 seconds.  Neurological:     Mental Status: He is alert and oriented to person, place, and time.  Psychiatric:        Behavior: Behavior normal.      Musculoskeletal Exam: C-spine, thoracic spine, lumbar spine have good range of motion.  No midline spinal tenderness.  No SI joint tenderness.  Shoulder joints, elbow joints, wrist joints, MCPs, PIPs, DIPs have good range of motion with no synovitis.  Complete fist formation bilaterally.  Right hip is  severely limited range of motion with discomfort.  Left hip has slightly limited range of motion.  He knee joints have good range of motion no warmth or effusion.  Ankle joints have good range of motion no tenderness or joint swelling.  No evidence of Achilles tendinitis.    CDAI Exam: CDAI Score: -- Patient Global: --; Provider Global: -- Swollen: --; Tender: -- Joint Exam 12/09/2023   No joint exam has been documented for this visit   There is currently no information documented on the homunculus. Go to the Rheumatology activity and complete the homunculus  joint exam.  Investigation: No additional findings.  Imaging: CT CARDIAC SCORING (SELF PAY ONLY) Addendum Date: 11/24/2023 ADDENDUM REPORT: 11/24/2023 18:57 EXAM: OVER-READ INTERPRETATION  CT CHEST The following report is an over-read performed by radiologist Dr. Andrea Gasman of [facility] on 11/24/2023. This over-read does not include interpretation of cardiac or coronary anatomy or pathology. The coronary calcium score interpretation by the cardiologist is attached. COMPARISON:  Cardiac CT 05/05/2019 FINDINGS: Vascular: No aortic atherosclerosis. The included aorta is normal in caliber. Mediastinum/nodes: No adenopathy or mass. Unremarkable esophagus. Lungs: No focal airspace disease. No pulmonary nodule. No pleural fluid. The included airways are patent. Upper abdomen: No acute findings. Diffusely decreased hepatic density typical of steatosis. Musculoskeletal: There are no acute or suspicious osseous abnormalities. IMPRESSION: Hepatic steatosis.  No acute extracardiac findings. Electronically Signed   By: Andrea Gasman M.D.   On: 11/24/2023 18:57   Result Date: 11/24/2023 CLINICAL DATA:  Risk stratification: 43 Year-old Male EXAM: Coronary Calcium Score TECHNIQUE: A gated, non-contrast computed tomography scan of the heart was performed using 2.5 mm slice thickness. Axial images were analyzed on a dedicated  workstation. Calcium scoring of the coronary arteries was performed using the Agatston method. FINDINGS: Coronary Calcium Score: 0 Percentile: 1st Ascending Aorta: Normal caliber. No calcifications. Aortic Valve Calcium score: 0 Mitral annular calcification: 0 Pericardium: Normal. Non-cardiac: See separate report from [facility]. IMPRESSION: 1. Coronary calcium score of 0. This was 1st percentile for age-, race-, and sex-matched controls. RECOMMENDATIONS: Coronary artery calcium (CAC) score is a strong predictor of incident coronary heart disease (CHD) and provides predictive information beyond traditional risk factors. CAC scoring is reasonable to use in the decision to withhold, postpone, or initiate statin therapy in intermediate-risk or selected borderline-risk asymptomatic adults (age 82-75 years and LDL-C >=70 to <190 mg/dL) who do not have diabetes or established atherosclerotic cardiovascular disease (ASCVD).* In intermediate-risk (10-year ASCVD risk >=7.5% to <20%) adults or selected borderline-risk (10-year ASCVD risk >=5% to <7.5%) adults in whom a CAC score is measured for the purpose of making a treatment decision the following recommendations have been made: If CAC=0, it is reasonable to withhold statin therapy and reassess in 5 to 10 years, as long as higher risk conditions are absent (diabetes mellitus, family history of premature CHD in first degree relatives (males <55 years; females <65 years), cigarette smoking, or LDL >=190 mg/dL). If CAC is 1 to 99, it is reasonable to initiate statin therapy for patients >=3 years of age. If CAC is >=100 or >=75th percentile, it is reasonable to initiate statin therapy at any age. Cardiology referral should be considered for patients with CAC scores >=400 or >=75th percentile. *2018 AHA/ACC/AACVPR/AAPA/ABC/ACPM/ADA/AGS/APhA/ASPC/NLA/PCNA Guideline on the Management of Blood Cholesterol: A Report of the American College of Cardiology/American Heart  Association Task Force on Clinical Practice Guidelines. J Am Coll Cardiol. 2019;73(24):3168-3209. Stanly Leavens, MD Electronically Signed: By: Stanly Leavens M.D. On: 11/22/2023 14:32    Recent Labs: Lab Results  Component Value Date   WBC 6.6 08/04/2023   HGB 14.3 08/04/2023   PLT 340 08/04/2023   NA 138 08/04/2023   K 4.5 08/04/2023   CL 101 08/04/2023   CO2 29 08/04/2023   GLUCOSE 95 08/04/2023   BUN 13 08/04/2023   CREATININE 1.11 08/04/2023   BILITOT 0.4 08/04/2023   ALKPHOS 55 08/17/2016   AST 21 08/04/2023   ALT 43 08/04/2023   PROT 7.4 08/04/2023   ALBUMIN 4.4 08/17/2016   CALCIUM 9.7  08/04/2023   GFRAA 105 05/01/2020   QFTBGOLD NEGATIVE 10/19/2016   QFTBGOLDPLUS NEGATIVE 08/04/2023    Speciality Comments: Prior therapy: Humira and Enbrel  (inadequate response) MTX- fatigue  Procedures:  No procedures performed Allergies: Adderall [amphetamine-dextroamphetamine] and Methotrexate and trimetrexate   Assessment / Plan:     Visit Diagnoses: Psoriatic arthritis (HCC) - Ultrasound of the right hand performed on 03/29/2018 negative for synovitis: No synovitis or dactylitis noted on examination today.  No evidence of Achilles tendinitis.  No SI joint tenderness upon palpation.  Patient has remained on Cosentyx  150 mg sq injections once every 14 days.  No injection site reactions or side effects.  No recent gaps in therapy. No active psoriasis at this time. His primary concern has been pain in both hips, right greater than left.  He is established care with Dr. Fidel and has been found to have avascular necrosis affecting both hips.  Underlying etiology is unknown.  Patient has not been on long-term corticosteroids.  He is scheduled for a right total hip arthroplasty on 01/14/2024.  He is aware that he should hold Cosentyx  for 1 dosing cycle prior to surgical intervention.  He will require clearance by Dr. Fidel prior to resuming Cosentyx . He was advised to  notify us  if he develops any signs or symptoms of a flare.  He will follow-up in the office in 3 months or sooner if needed.  Psoriasis: No active psoriasis at this time.  High risk medication use - Cosentyx  150 mg sq injections once every 14 days. CBC and CMP updated on 08/04/23.  Order for CBC and CMP released today TB gold negative on 08/04/23.   No recent or recurrent infections.  Discussed the importance of holding cosentyx  if he develops signs or symptoms of an infection and to resume once the infection has completely cleared.   - Plan: CBC with Differential/Platelet, Comprehensive metabolic panel with GFR  DDD (degenerative disc disease), thoracic: No midline spinal tenderness.   Spondylosis of lumbar region without myelopathy or radiculopathy: Intermittent discomfort.  No symptoms of radiculopathy at this time.   Avascular necrosis of bones of both hips [facility]): Diagnosed by Dr. Fidel.  Patient will be undergoing right total hip arthroplasty on 01/14/2024.  Pain in right hip: He has been experiencing persistent pain in the right hip.  He was evaluated by Dr. Fidel and was found to have avascular necrosis affecting both hips.  He has not been on long-term corticosteroids and is unsure of the underlying cause of AVN.  He is scheduled for a right total hip replacement on 01/14/2024.  Patient is aware that he is to hold Cosentyx  for 1 dosing cycle prior to undergoing surgical intervention.  He will require surgical clearance by Dr. Fidel prior to resuming Cosentyx .  Other medical conditions are listed as follows:  History of meniscal tear  History of hypertension: Blood pressure was slightly elevated today in the office and was rechecked prior to leaving.  Patient was advised to monitor blood pressure closely and to reach out to PCP if blood pressure remains elevated.  History of gastroesophageal reflux (GERD)  History of ADHD  History of depression  Orders: Orders Placed This  Encounter  Procedures   CBC with Differential/Platelet   Comprehensive metabolic panel with GFR   Meds ordered this encounter  Medications   Secukinumab , 300 MG Dose, (COSENTYX  SENSOREADY, 300 MG,) 150 MG/ML SOAJ    Sig: INJECT 150 MG (1 PEN) INTO THE SKIN EVERY 14 (FOURTEEN) DAYS.  Dispense:  6 mL    Refill:  0    Prescription Type::   Renewal    Follow-Up Instructions: Return in about 3 months (around 03/10/2024) for Psoriatic arthritis.   Waddell CHRISTELLA Craze, PA-C  Note - This record has been created using Dragon software.  Chart creation errors have been sought, but may not always  have been located. Such creation errors do not reflect on  the standard of medical care.

## 2023-11-25 NOTE — Telephone Encounter (Signed)
 Last Fill: 09/06/2023  Labs: 10/21/2023 ([facility]) ALT 47  TB Gold: 08/04/2023 TB Gold is negative   Next Visit: 12/09/2023  Last Visit: 09/23/2023  IK:Ednmpjupr arthritis (HCC)   Current Dose per office note 09/23/2023: Cosentyx  150 mg sq injections once every 14 days.   Okay to refill Cosentyx ?

## 2023-11-26 ENCOUNTER — Other Ambulatory Visit: Payer: Self-pay

## 2023-11-29 ENCOUNTER — Other Ambulatory Visit: Payer: Self-pay

## 2023-12-09 ENCOUNTER — Ambulatory Visit: Attending: Physician Assistant | Admitting: Physician Assistant

## 2023-12-09 ENCOUNTER — Encounter: Payer: Self-pay | Admitting: Physician Assistant

## 2023-12-09 ENCOUNTER — Other Ambulatory Visit (HOSPITAL_COMMUNITY): Payer: Self-pay

## 2023-12-09 ENCOUNTER — Other Ambulatory Visit: Payer: Self-pay

## 2023-12-09 VITALS — BP 127/91 | HR 86 | Temp 97.0°F | Resp 14 | Ht 72.5 in | Wt 223.4 lb

## 2023-12-09 DIAGNOSIS — M87052 Idiopathic aseptic necrosis of left femur: Secondary | ICD-10-CM

## 2023-12-09 DIAGNOSIS — Z8679 Personal history of other diseases of the circulatory system: Secondary | ICD-10-CM

## 2023-12-09 DIAGNOSIS — Z79899 Other long term (current) drug therapy: Secondary | ICD-10-CM

## 2023-12-09 DIAGNOSIS — M25551 Pain in right hip: Secondary | ICD-10-CM

## 2023-12-09 DIAGNOSIS — Z8659 Personal history of other mental and behavioral disorders: Secondary | ICD-10-CM

## 2023-12-09 DIAGNOSIS — M47816 Spondylosis without myelopathy or radiculopathy, lumbar region: Secondary | ICD-10-CM

## 2023-12-09 DIAGNOSIS — Z8719 Personal history of other diseases of the digestive system: Secondary | ICD-10-CM

## 2023-12-09 DIAGNOSIS — L409 Psoriasis, unspecified: Secondary | ICD-10-CM

## 2023-12-09 DIAGNOSIS — L4050 Arthropathic psoriasis, unspecified: Secondary | ICD-10-CM | POA: Diagnosis not present

## 2023-12-09 DIAGNOSIS — M5134 Other intervertebral disc degeneration, thoracic region: Secondary | ICD-10-CM

## 2023-12-09 DIAGNOSIS — Z87828 Personal history of other (healed) physical injury and trauma: Secondary | ICD-10-CM

## 2023-12-09 DIAGNOSIS — M87051 Idiopathic aseptic necrosis of right femur: Secondary | ICD-10-CM

## 2023-12-09 LAB — CBC WITH DIFFERENTIAL/PLATELET
Absolute Lymphocytes: 2098 {cells}/uL (ref 850–3900)
Absolute Monocytes: 697 {cells}/uL (ref 200–950)
Basophils Absolute: 48 {cells}/uL (ref 0–200)
Basophils Relative: 0.7 %
Eosinophils Absolute: 290 {cells}/uL (ref 15–500)
Eosinophils Relative: 4.2 %
HCT: 47.2 % (ref 38.5–50.0)
Hemoglobin: 16.1 g/dL (ref 13.2–17.1)
MCH: 30.1 pg (ref 27.0–33.0)
MCHC: 34.1 g/dL (ref 32.0–36.0)
MCV: 88.4 fL (ref 80.0–100.0)
MPV: 9.5 fL (ref 7.5–12.5)
Monocytes Relative: 10.1 %
Neutro Abs: 3767 {cells}/uL (ref 1500–7800)
Neutrophils Relative %: 54.6 %
Platelets: 407 Thousand/uL — ABNORMAL HIGH (ref 140–400)
RBC: 5.34 Million/uL (ref 4.20–5.80)
RDW: 13 % (ref 11.0–15.0)
Total Lymphocyte: 30.4 %
WBC: 6.9 Thousand/uL (ref 3.8–10.8)

## 2023-12-09 LAB — COMPREHENSIVE METABOLIC PANEL WITH GFR
AG Ratio: 1.7 (calc) (ref 1.0–2.5)
ALT: 40 U/L (ref 9–46)
AST: 20 U/L (ref 10–40)
Albumin: 4.9 g/dL (ref 3.6–5.1)
Alkaline phosphatase (APISO): 61 U/L (ref 36–130)
BUN: 11 mg/dL (ref 7–25)
CO2: 28 mmol/L (ref 20–32)
Calcium: 10.2 mg/dL (ref 8.6–10.3)
Chloride: 103 mmol/L (ref 98–110)
Creat: 1.12 mg/dL (ref 0.60–1.29)
Globulin: 2.9 g/dL (ref 1.9–3.7)
Glucose, Bld: 96 mg/dL (ref 65–99)
Potassium: 4.7 mmol/L (ref 3.5–5.3)
Sodium: 140 mmol/L (ref 135–146)
Total Bilirubin: 0.3 mg/dL (ref 0.2–1.2)
Total Protein: 7.8 g/dL (ref 6.1–8.1)
eGFR: 83 mL/min/1.73m2 (ref >=60)

## 2023-12-09 MED ORDER — COSENTYX SENSOREADY (300 MG) 150 MG/ML ~~LOC~~ SOAJ
SUBCUTANEOUS | 0 refills | Status: AC
  Filled 2023-12-09: qty 6, fill #0
  Filled 2023-12-10 – 2023-12-13 (×2): qty 2, 28d supply, fill #0
  Filled 2024-01-03: qty 2, 28d supply, fill #1
  Filled 2024-02-25: qty 2, 28d supply, fill #2

## 2023-12-10 ENCOUNTER — Ambulatory Visit: Payer: Self-pay | Admitting: Physician Assistant

## 2023-12-10 ENCOUNTER — Other Ambulatory Visit (HOSPITAL_COMMUNITY): Payer: Self-pay

## 2023-12-10 NOTE — Progress Notes (Signed)
 Platelet count is borderline elevated, rest of CBC WNL.   CMP WNL.

## 2023-12-13 ENCOUNTER — Other Ambulatory Visit: Payer: Self-pay

## 2023-12-13 NOTE — Progress Notes (Signed)
 Specialty Pharmacy Refill Coordination Note  Jacob Massey is a 43 y.o. male contacted today regarding refills of specialty medication(s) Secukinumab  (Cosentyx  Sensoready (300 MG))   Patient requested Delivery   Delivery date: 12/14/23   Verified address: [address] [place] [address]   Medication will be filled on: 12/13/23  patient will inject on 11.11, then hold for 2 weeks prior to 12.12.25 surgery and then resume 2 weeks after.

## 2024-01-03 ENCOUNTER — Other Ambulatory Visit: Payer: Self-pay

## 2024-01-05 ENCOUNTER — Other Ambulatory Visit (HOSPITAL_COMMUNITY): Payer: Self-pay

## 2024-01-05 ENCOUNTER — Other Ambulatory Visit: Payer: Self-pay

## 2024-01-05 NOTE — Progress Notes (Signed)
 Specialty Pharmacy Refill Coordination Note  Jacob Massey is a 43 y.o. male contacted today regarding refills of specialty medication(s) Secukinumab  (Cosentyx  Sensoready (300 MG))   Patient requested Delivery   Delivery date: 01/12/24   Verified address: [address] [place] [address]   Medication will be filled on: 01/11/24

## 2024-01-11 ENCOUNTER — Other Ambulatory Visit: Payer: Self-pay

## 2024-01-23 ENCOUNTER — Emergency Department (HOSPITAL_BASED_OUTPATIENT_CLINIC_OR_DEPARTMENT_OTHER)
Admission: EM | Admit: 2024-01-23 | Discharge: 2024-01-23 | Disposition: A | Discharge status: Home / Self Care | Attending: Emergency Medicine | Admitting: Emergency Medicine

## 2024-01-23 ENCOUNTER — Emergency Department (HOSPITAL_BASED_OUTPATIENT_CLINIC_OR_DEPARTMENT_OTHER)

## 2024-01-23 ENCOUNTER — Emergency Department (HOSPITAL_BASED_OUTPATIENT_CLINIC_OR_DEPARTMENT_OTHER): Admitting: Radiology

## 2024-01-23 ENCOUNTER — Encounter (HOSPITAL_BASED_OUTPATIENT_CLINIC_OR_DEPARTMENT_OTHER): Payer: Self-pay

## 2024-01-23 ENCOUNTER — Other Ambulatory Visit: Payer: Self-pay

## 2024-01-23 DIAGNOSIS — M79651 Pain in right thigh: Secondary | ICD-10-CM | POA: Insufficient documentation

## 2024-01-23 LAB — CBC WITH DIFFERENTIAL/PLATELET
Abs Immature Granulocytes: 0.11 K/uL — ABNORMAL HIGH (ref 0.00–0.07)
Basophils Absolute: 0.1 K/uL (ref 0.0–0.1)
Basophils Relative: 1 %
Eosinophils Absolute: 0.2 K/uL (ref 0.0–0.5)
Eosinophils Relative: 3 %
HCT: 33.3 % — ABNORMAL LOW (ref 39.0–52.0)
Hemoglobin: 11.5 g/dL — ABNORMAL LOW (ref 13.0–17.0)
Immature Granulocytes: 1 %
Lymphocytes Relative: 19 %
Lymphs Abs: 1.5 K/uL (ref 0.7–4.0)
MCH: 30.3 pg (ref 26.0–34.0)
MCHC: 34.5 g/dL (ref 30.0–36.0)
MCV: 87.9 fL (ref 80.0–100.0)
Monocytes Absolute: 0.8 K/uL (ref 0.1–1.0)
Monocytes Relative: 10 %
Neutro Abs: 5.2 K/uL (ref 1.7–7.7)
Neutrophils Relative %: 66 %
Platelets: 518 K/uL — ABNORMAL HIGH (ref 150–400)
RBC: 3.79 MIL/uL — ABNORMAL LOW (ref 4.22–5.81)
RDW: 13.4 % (ref 11.5–15.5)
WBC: 7.8 K/uL (ref 4.0–10.5)
nRBC: 0 % (ref 0.0–0.2)

## 2024-01-23 LAB — BASIC METABOLIC PANEL WITH GFR
Anion gap: 10 (ref 5–15)
BUN: 16 mg/dL (ref 6–20)
CO2: 28 mmol/L (ref 22–32)
Calcium: 9.9 mg/dL (ref 8.9–10.3)
Chloride: 97 mmol/L — ABNORMAL LOW (ref 98–111)
Creatinine, Ser: 0.88 mg/dL (ref 0.61–1.24)
GFR, Estimated: >60 mL/min
Glucose, Bld: 176 mg/dL — ABNORMAL HIGH (ref 70–99)
Potassium: 4.2 mmol/L (ref 3.5–5.1)
Sodium: 135 mmol/L (ref 135–145)

## 2024-01-23 LAB — LACTIC ACID, PLASMA: Lactic Acid, Venous: 2 mmol/L (ref 0.5–1.9)

## 2024-01-23 MED ORDER — SODIUM CHLORIDE 0.9 % IV BOLUS
1000.0000 mL | Freq: Once | INTRAVENOUS | Status: AC
  Administered 2024-01-23: 1000 mL via INTRAVENOUS

## 2024-01-23 MED ORDER — HYDROMORPHONE HCL 1 MG/ML IJ SOLN
1.0000 mg | Freq: Once | INTRAMUSCULAR | Status: AC
  Administered 2024-01-23: 1 mg via INTRAVENOUS
  Filled 2024-01-23: qty 1

## 2024-01-23 MED ORDER — ONDANSETRON HCL 4 MG/2ML IJ SOLN
4.0000 mg | Freq: Once | INTRAMUSCULAR | Status: AC
  Administered 2024-01-23: 4 mg via INTRAVENOUS
  Filled 2024-01-23: qty 2

## 2024-01-23 NOTE — Discharge Instructions (Signed)
 Please read and follow all provided instructions.  Your diagnoses today include:  1. Right thigh pain    Tests performed today include: Complete blood cell count: Your infection fighting cell count was normal, your hemoglobin is slightly low, likely related to your recent surgery Basic metabolic panel: Blood sugar was 176 Lactic acid was minimally high at 2.0, this was treated with IV fluids. X-ray of the right femur shows normal appearing postoperative changes, no gas in the soft tissues Ultrasound to evaluate for blood clot: Was negative for blood clot or other acute problems Vital signs. See below for your results today.   Medications prescribed:  None  Take any prescribed medications only as directed.  Home care instructions:  Follow any educational materials contained in this packet.  Follow-up instructions: Please follow-up with your orthopedic surgeon as planned in 2 days.  Return instructions:  Please return to the Emergency Department if you experience worsening symptoms.  Return if you have worsening pain and redness, especially if you have worsening and persistent redness, swelling, warmth in the thigh.  Also return with worsening pain with movement, worsening inability to bear weight or move because of pain. Please return if you have any other emergent concerns.  Additional Information:  Your vital signs today were: BP (!) 141/84   Pulse 78   Temp 98.2 F (36.8 C) (Oral)   Resp 14   Ht 6' (1.829 m)   Wt 100.2 kg   SpO2 97%   BMI 29.97 kg/m  If your blood pressure (BP) was elevated above 135/85 this visit, please have this repeated by your doctor within one month. --------------

## 2024-01-23 NOTE — ED Provider Notes (Signed)
 " [place] EMERGENCY DEPARTMENT AT [facility] Provider Note   CSN: [id number] Arrival date & time: 01/23/24  1141     Patient presents with: Leg Pain   Jacob Massey is a 43 y.o. male.   Patient presents to the emergency department for evaluation of right mid quadriceps pain after having a hip replacement approximately 9 days ago by Dr. Fidel.  Patient has a history of avascular necrosis.  Pain started 2 days ago, mild.  Yesterday the pain became more noticeable and persistent, more severe this morning, prompting emergency department visit.  Patient has noted some redness and swelling prior to arrival, over the mid and distal medial thigh area, however notes that this seems to have improved over the past 15 minutes.  Denies fevers.  Patient with normal range of motion of his knee.  Still unable to flex at the hip and raise foot off of the bed since surgery.  He has not spoken with his surgeon today.  No chest pain, shortness of breath, fevers, vomiting.      Prior to Admission medications  Medication Sig Start Date End Date Taking? Authorizing Provider  amphetamine-dextroamphetamine (ADDERALL) 20 MG tablet Take 20 mg by mouth 3 (three) times daily. 09/19/19   [provider]  clobetasol  cream (TEMOVATE ) 0.05 % Apply 1 Application topically 2 (two) times daily. Patient not taking: Reported on 12/09/2023 09/23/23   Cheryl Waddell HERO, PA-C  FLUoxetine (PROZAC) 20 MG capsule Take 20 mg by mouth daily. 05/04/23   [provider]  guanFACINE (INTUNIV) 4 MG TB24 ER tablet Take 4 mg by mouth every morning. 11/17/21   [provider]  hydrocortisone 2.5 % cream Apply topically daily. 11/09/22   [provider]  losartan (COZAAR) 100 MG tablet Take 100 mg by mouth daily. 10/20/21   [provider]  metFORMIN (GLUCOPHAGE-XR) 750 MG 24 hr tablet Take 750 mg by mouth daily. 05/26/21   [provider]  OZEMPIC, 0.25 OR 0.5 MG/DOSE, 2 MG/3ML  SOPN SMARTSIG:0.5 Milligram(s) SUB-Q Every 4 Weeks Patient not taking: Reported on 12/09/2023    [provider]  OZEMPIC, 1 MG/DOSE, 4 MG/3ML SOPN Inject 1 mg into the skin once a week. 11/01/23   [provider]  pantoprazole (PROTONIX) 40 MG tablet Take 40 mg by mouth daily. 05/02/19   [provider]  Secukinumab , 300 MG Dose, (COSENTYX  SENSOREADY, 300 MG,) 150 MG/ML SOAJ INJECT 150 MG (1 PEN) INTO THE SKIN EVERY 14 (FOURTEEN) DAYS. 12/09/23 12/08/24  Cheryl Waddell HERO, PA-C  TRINTELLIX 10 MG TABS tablet Take 10 mg by mouth daily. Patient not taking: Reported on 12/09/2023 11/25/22   [provider]  VITAMIN D PO Take by mouth.    [provider]    Allergies: Adderall [amphetamine-dextroamphetamine] and Methotrexate and trimetrexate    Review of Systems  Updated Vital Signs BP (!) 153/92   Pulse (!) 127   Temp 98.3 F (36.8 C) (Oral)   Resp 18   Ht 6' (1.829 m)   Wt 100.2 kg   SpO2 100%   BMI 29.97 kg/m   Physical Exam Vitals and nursing note reviewed.  Constitutional:      Appearance: He is well-developed.  HENT:     Head: Normocephalic and atraumatic.  Eyes:     Conjunctiva/sclera: Conjunctivae normal.  Cardiovascular:     Rate and Rhythm: Tachycardia present.     Pulses:          Dorsalis pedis pulses are  2+ on the right side.  Pulmonary:     Effort: No respiratory distress.  Musculoskeletal:     Cervical back: Normal range of motion and neck supple.       Legs:     Comments: Patient with anterior mid to distal thigh pain.  Skin:    General: Skin is warm and dry.  Neurological:     Mental Status: He is alert.     (all labs ordered are listed, but only abnormal results are displayed) Labs Reviewed  CBC WITH DIFFERENTIAL/PLATELET - Abnormal; Notable for the following components:      Result Value   RBC 3.79 (*)    Hemoglobin 11.5 (*)    HCT 33.3 (*)    Platelets 518 (*)    Abs Immature Granulocytes 0.11 (*)    All  other components within normal limits  BASIC METABOLIC PANEL WITH GFR - Abnormal; Notable for the following components:   Chloride 97 (*)    Glucose, Bld 176 (*)    All other components within normal limits  LACTIC ACID, PLASMA - Abnormal; Notable for the following components:   Lactic Acid, Venous 2.0 (*)    All other components within normal limits    EKG: None  Radiology: US  Venous Img Lower Unilateral Right Result Date: 01/23/2024 EXAM: ULTRASOUND DUPLEX OF THE RIGHT LOWER EXTREMITY VEINS TECHNIQUE: Duplex ultrasound using B-mode/gray scaled imaging and Doppler spectral analysis and color flow was obtained of the deep venous structures of the right lower extremity. COMPARISON: None available. CLINICAL HISTORY: Leg pain. Right thigh pain for 3 days, status post right hip replacement 9 days ago. FINDINGS: The right common femoral vein and the saphenofemoral junction could not be evaluated due to overlying bandage material. The visualized portions of the femoral vein, popliteal vein, and posterior tibial vein demonstrate normal compressibility with normal color flow and spectral analysis. IMPRESSION: 1. No evidence of deep venous thrombosis in the visualized segments of the right lower extremity as delineated above. Electronically signed by: Rogelia Myers MD 01/23/2024 02:04 PM EST RP Workstation: [id number]   DG Femur Min 2 Views Right Result Date: 01/23/2024 CLINICAL DATA:  Mid thigh pain for 2 days.  Recent hip replacement. EXAM: RIGHT FEMUR 2 VIEWS COMPARISON:  None Available. FINDINGS: Right hip arthroplasty in expected alignment. No periprosthetic lucency. No acute or periprosthetic fracture. No periostitis, erosive or bony destructive change. Mild soft tissue edema. Skin staples about the proximal femur. IMPRESSION: 1. Right hip arthroplasty in expected alignment. No acute osseous abnormality. 2. Mild soft tissue edema. Electronically Signed   By: Andrea Gasman M.D.   On: 01/23/2024  12:20     Procedures   Medications Ordered in the ED  HYDROmorphone  (DILAUDID ) injection 1 mg (1 mg Intravenous Given 01/23/24 1252)  ondansetron  (ZOFRAN ) injection 4 mg (4 mg Intravenous Given 01/23/24 1252)    ED Course  Patient seen and examined. History obtained directly from patient.   Labs/EKG: CBC, BMP ordered in triage, lactate ordered due to tachycardia.  Imaging: Agree with ultrasound of the right lower extremity ordered in triage to evaluate for DVT, I also added plain film.  Medications/Fluids: None ordered  Most recent vital signs reviewed and are as follows: BP (!) 153/92   Pulse (!) 127   Temp 98.3 F (36.8 C) (Oral)   Resp 18   Ht 6' (1.829 m)   Wt 100.2 kg   SpO2 100%   BMI 29.97 kg/m   Initial impression: Thigh pain  1:15 PM ultrasonographer having difficulty performing exam due to pain.  Dilaudid  ordered for pain control and to allow patient to relax for exam.  Labs personally reviewed and interpreted including: CBC with normal white blood cell count, hemoglobin 11.5 likely related to recent surgery; BMP chloride 97, glucose 176 otherwise unremarkable; lactate minimally elevated at 2.0.  Imaging personally visualized and interpreted including: X-ray agree no soft tissue gas or bony abnormality.  Reviewed pertinent lab work and imaging with patient at bedside. Questions answered.   Most current vital signs reviewed and are as follows: BP (!) 153/92   Pulse (!) 127   Temp 98.3 F (36.8 C) (Oral)   Resp 18   Ht 6' (1.829 m)   Wt 100.2 kg   SpO2 100%   BMI 29.97 kg/m   Plan: Awaiting ultrasound results.  1:43 PM Discussed case with Dr. Ruthe who has seen patient. Feels patient can be dc if imaging reassuring. Pt doing better now. Exam not consistent with deep tissue infection. Elevated lactate treated with fluid bolus, cancel repeat.   2:32 PM Reassessment performed. Patient appears stable.  He looks very comfortable, pain currently  controlled.  Fluids are finishing.  On reexam, no worsening swelling, redness, warmth in the area of the thigh.  Exam appears quite stable.  Imaging results reviewed: DVT study was negative  Reviewed pertinent lab work and imaging with patient at bedside. Questions answered.  At this time, feel comfortable discharge with close monitoring.  They have follow-up in 48 hours.  Most current vital signs reviewed and are as follows: BP (!) 141/84   Pulse 78   Temp 98.2 F (36.8 C) (Oral)   Resp 14   Ht 6' (1.829 m)   Wt 100.2 kg   SpO2 97%   BMI 29.97 kg/m   Plan: Discharge to home.   Prescriptions written for: None  Other home care instructions discussed: Continue compression  ED return instructions discussed: Return with worsening pain, redness, swelling, fever  Follow-up instructions discussed: Patient encouraged to follow-up with their surgeon in 2 days as planned.                                   Medical Decision Making Amount and/or Complexity of Data Reviewed Labs: ordered. Radiology: ordered.  Risk Prescription drug management.   Patient presents with new onset of right thigh pain after recent hip surgery.  Patient did note some redness and swelling, however this seems to be improved and stable during ED stay.  Ultrasound was negative.  X-ray was reassuring.  Overall labs reassuring with normal white blood cell count.  Lactate was minimally elevated at 2.0, treated with IV fluids.  Low clinical concern at this time for necrotizing fasciitis, myositis.  Patient does not appear septic.  Tachycardic on arrival, normalized during ED stay.  No hypotension.  No recent fevers.  Patient will continue to monitor, return with any alarm symptoms.  He has appropriate follow-up in 2 days for recheck.  The patient's vital signs, pertinent lab work and imaging were reviewed and interpreted as discussed in the ED course. Hospitalization was considered for further testing, treatments, or  serial exams/observation. However as patient is well-appearing, has a stable exam, and reassuring studies today, I do not feel that they warrant admission at this time. This plan was discussed with the patient who verbalizes agreement and comfort with this plan and seems reliable and  able to return to the Emergency Department with worsening or changing symptoms.       Final diagnoses:  Right thigh pain    ED Discharge Orders     None          Desiderio Chew, PA-C 01/23/24 1435    Ruthe Cornet, DO 01/23/24 1442  "

## 2024-01-23 NOTE — ED Provider Notes (Signed)
 Shared visit.  Patient here with right thigh pain.  Has had right hip replacement.  Supposedly has some swelling and redness to just above the right knee but resolved.  He is wearing compression socks.  He is not having any fevers or chills.  On my evaluation pain is much improved.  He was having some pain just above the right knee.  He has some dependent edema from his recent surgery I suspect but ultrasound was negative for DVT.  There is no free air or crepitus on exam or on x-ray.  He has no white count.  Lactic acid was 2 somewhat expected given recent surgery.  He is given IV fluids.  He is neurovascular neuromuscular intact on exam.  He is not having any pain at the surgical site is able to flex and extend at the right hip without any major discomfort.  I do suspect pain likely from muscle spasm may be wearing the compression socks too tightly but at this time I do not have any concern for infectious process.  He is neurovascular neuromuscular intact.  He has no blood clot.  Good pulses on exam.  Continue to monitor for any infectious process and continue supportive care at home with his pain medicine and muscle relaxant.  Maybe try to give himself a break from the compression socks at times.  Discharge.  Understands return precautions.  Follow-up with surgeon.  This chart was dictated using voice recognition software.  Despite best efforts to proofread,  errors can occur which can change the documentation meaning.    Ruthe Cornet, DO 01/23/24 1414

## 2024-01-23 NOTE — ED Triage Notes (Signed)
 Pt reports having R hip replacement x9 days ago. Pt reports R thigh pain starting yesterday. Pt has good distal PMS in RLE.

## 2024-02-01 ENCOUNTER — Other Ambulatory Visit (HOSPITAL_COMMUNITY): Payer: Self-pay

## 2024-02-25 ENCOUNTER — Other Ambulatory Visit: Payer: Self-pay

## 2024-02-28 ENCOUNTER — Other Ambulatory Visit: Payer: Self-pay

## 2024-02-28 NOTE — Progress Notes (Unsigned)
 "  Office Visit Note  Patient: Jacob Massey             Date of Birth: 08/12/1980           MRN: [id number]             PCP: Clarice Nottingham, MD Referring: Clarice Nottingham, MD Visit Date: 03/13/2024 Occupation: Data Unavailable  Subjective:  No chief complaint on file.   History of Present Illness: Jacob Massey is a 44 y.o. male ***     Activities of Daily Living:  Patient reports morning stiffness for *** {minute/hour:19697}.   Patient {ACTIONS;DENIES/REPORTS:21021675::Denies} nocturnal pain.  Difficulty dressing/grooming: {ACTIONS;DENIES/REPORTS:21021675::Denies} Difficulty climbing stairs: {ACTIONS;DENIES/REPORTS:21021675::Denies} Difficulty getting out of chair: {ACTIONS;DENIES/REPORTS:21021675::Denies} Difficulty using hands for taps, buttons, cutlery, and/or writing: {ACTIONS;DENIES/REPORTS:21021675::Denies}  No Rheumatology ROS completed.   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    Past Medical History:  Diagnosis Date   ADHD 12/03/2015   AVN (avascular necrosis of bone) (HCC)    Bicipital tendinitis 12/03/2015   Genital herpes 12/03/2015   GERD (gastroesophageal reflux disease) 12/03/2015   History of diabetes mellitus, type II    per patient   Hypertension 12/03/2015   Psoriasis 12/03/2015   Psoriatic arthritis (HCC) 12/03/2015   Psoriatic arthritis (HCC)    Sinusitis    Tendonitis     Family History  Problem  Relation Age of Onset   Arthritis Mother    Diabetes Mother        borderline    Arrhythmia Mother    Heart attack Father    Cancer Father    Heart attack Maternal Grandfather    ADD / ADHD Daughter    Past Surgical History:  Procedure Laterality Date   KNEE ARTHROSCOPY Left    VASECTOMY     Social History[1] Social History   Social History Narrative   Not on file      There is no immunization history on file for this patient.   Objective: Vital Signs: There were no vitals taken for this visit.   Physical Exam   Musculoskeletal Exam: ***  CDAI Exam: CDAI Score: -- Patient Global: --; Provider Global: -- Swollen: --; Tender: -- Joint Exam 03/13/2024   No joint exam has been documented for this visit   There is currently no information documented on the homunculus. Go to the Rheumatology activity and complete the homunculus joint exam.  Investigation: No additional findings.  Imaging: No results found.  Recent Labs: Lab Results  Component Value Date   WBC 7.8 01/23/2024   HGB 11.5 (L) 01/23/2024   PLT 518 (H) 01/23/2024   NA 135 01/23/2024   K 4.2 01/23/2024   CL 97 (L) 01/23/2024   CO2 28 01/23/2024   GLUCOSE 176 (H) 01/23/2024   BUN 16 01/23/2024   CREATININE 0.88 01/23/2024   BILITOT 0.3 12/09/2023   ALKPHOS 55 08/17/2016   AST 20 12/09/2023   ALT 40 12/09/2023   PROT 7.8 12/09/2023   ALBUMIN 4.4 08/17/2016  CALCIUM 9.9 01/23/2024   GFRAA 105 05/01/2020   QFTBGOLD NEGATIVE 10/19/2016   QFTBGOLDPLUS NEGATIVE 08/04/2023    Speciality Comments: Prior therapy: Humira and Enbrel  (inadequate response) MTX- fatigue  Procedures:  No procedures performed Allergies: Adderall [amphetamine-dextroamphetamine] and Methotrexate and trimetrexate   Assessment / Plan:     Visit Diagnoses: Psoriatic arthritis (HCC)  Psoriasis  High risk medication use  DDD (degenerative disc disease), thoracic  Spondylosis of lumbar region without myelopathy or  radiculopathy  History of hypertension  History of gastroesophageal reflux (GERD)  History of meniscal tear  History of ADHD  History of depression  Avascular necrosis of bones of both hips (HCC)  Orders: No orders of the defined types were placed in this encounter.  No orders of the defined types were placed in this encounter.   Face-to-face time spent with patient was *** minutes. Greater than 50% of time was spent in counseling and coordination of care.  Follow-Up Instructions: No follow-ups on file.   Waddell CHRISTELLA Craze, PA-C  Note - This record has been created using Dragon software.  Chart creation errors have been sought, but may not always  have been located. Such creation errors do not reflect on  the standard of medical care.     [1]  Social History Tobacco Use   Smoking status: Former    Current packs/day: 0.00    Average packs/day: 1 pack/day for 5.0 years (5.0 ttl pk-yrs)    Types: Cigarettes    Start date: 5    Quit date: 2004    Years since quitting: 22.0    Passive exposure: Never   Smokeless tobacco: Never  Vaping Use   Vaping status: Never Used  Substance Use Topics   Alcohol use: Yes    Comment: occ   Drug use: No   "

## 2024-03-08 ENCOUNTER — Other Ambulatory Visit (HOSPITAL_COMMUNITY): Payer: Self-pay

## 2024-03-13 ENCOUNTER — Ambulatory Visit: Admitting: Physician Assistant

## 2024-03-13 DIAGNOSIS — L4050 Arthropathic psoriasis, unspecified: Secondary | ICD-10-CM

## 2024-03-13 DIAGNOSIS — M47816 Spondylosis without myelopathy or radiculopathy, lumbar region: Secondary | ICD-10-CM

## 2024-03-13 DIAGNOSIS — Z79899 Other long term (current) drug therapy: Secondary | ICD-10-CM

## 2024-03-13 DIAGNOSIS — M5134 Other intervertebral disc degeneration, thoracic region: Secondary | ICD-10-CM

## 2024-03-13 DIAGNOSIS — L409 Psoriasis, unspecified: Secondary | ICD-10-CM

## 2024-03-13 DIAGNOSIS — Z8679 Personal history of other diseases of the circulatory system: Secondary | ICD-10-CM

## 2024-03-13 DIAGNOSIS — M87051 Idiopathic aseptic necrosis of right femur: Secondary | ICD-10-CM

## 2024-03-13 DIAGNOSIS — Z8719 Personal history of other diseases of the digestive system: Secondary | ICD-10-CM

## 2024-03-13 DIAGNOSIS — Z87828 Personal history of other (healed) physical injury and trauma: Secondary | ICD-10-CM

## 2024-03-13 DIAGNOSIS — Z8659 Personal history of other mental and behavioral disorders: Secondary | ICD-10-CM
